# Patient Record
Sex: Female | Born: 1945 | Race: White | Hispanic: No | Marital: Married | Smoking: Never smoker
Health system: Southern US, Community
[De-identification: ages and names within clinical notes are randomized; demographics above are authoritative.]

## PROBLEM LIST (undated history)

## (undated) DIAGNOSIS — K219 Gastro-esophageal reflux disease without esophagitis: Secondary | ICD-10-CM

## (undated) DIAGNOSIS — J45909 Unspecified asthma, uncomplicated: Secondary | ICD-10-CM

## (undated) DIAGNOSIS — E785 Hyperlipidemia, unspecified: Secondary | ICD-10-CM

## (undated) DIAGNOSIS — F329 Major depressive disorder, single episode, unspecified: Secondary | ICD-10-CM

## (undated) DIAGNOSIS — R0600 Dyspnea, unspecified: Secondary | ICD-10-CM

## (undated) DIAGNOSIS — I38 Endocarditis, valve unspecified: Secondary | ICD-10-CM

## (undated) DIAGNOSIS — C801 Malignant (primary) neoplasm, unspecified: Secondary | ICD-10-CM

## (undated) DIAGNOSIS — M1990 Unspecified osteoarthritis, unspecified site: Secondary | ICD-10-CM

## (undated) DIAGNOSIS — F32A Depression, unspecified: Secondary | ICD-10-CM

## (undated) DIAGNOSIS — D649 Anemia, unspecified: Secondary | ICD-10-CM

## (undated) DIAGNOSIS — R51 Headache: Secondary | ICD-10-CM

## (undated) DIAGNOSIS — R519 Headache, unspecified: Secondary | ICD-10-CM

## (undated) DIAGNOSIS — M549 Dorsalgia, unspecified: Secondary | ICD-10-CM

## (undated) DIAGNOSIS — I10 Essential (primary) hypertension: Secondary | ICD-10-CM

## (undated) DIAGNOSIS — R011 Cardiac murmur, unspecified: Secondary | ICD-10-CM

## (undated) DIAGNOSIS — I2699 Other pulmonary embolism without acute cor pulmonale: Secondary | ICD-10-CM

## (undated) DIAGNOSIS — Z8719 Personal history of other diseases of the digestive system: Secondary | ICD-10-CM

## (undated) DIAGNOSIS — J189 Pneumonia, unspecified organism: Secondary | ICD-10-CM

## (undated) DIAGNOSIS — I350 Nonrheumatic aortic (valve) stenosis: Secondary | ICD-10-CM

## (undated) DIAGNOSIS — Z86718 Personal history of other venous thrombosis and embolism: Secondary | ICD-10-CM

## (undated) HISTORY — PX: BLADDER REPAIR: SHX6721

## (undated) HISTORY — PX: COLONOSCOPY: SHX174

## (undated) HISTORY — PX: HERNIA REPAIR: SHX51

## (undated) HISTORY — PX: BREAST LUMPECTOMY: SHX2

## (undated) HISTORY — PX: BACK SURGERY: SHX140

## (undated) HISTORY — PX: NASAL SINUS SURGERY: SHX719

## (undated) HISTORY — PX: OTHER SURGICAL HISTORY: SHX169

## (undated) HISTORY — PX: BUNIONECTOMY: SHX129

## (undated) HISTORY — PX: TONSILLECTOMY: SUR1361

---

## 2014-11-20 HISTORY — PX: ANTERIOR CERVICAL DECOMP/DISCECTOMY FUSION: SHX1161

## 2015-10-07 ENCOUNTER — Other Ambulatory Visit: Payer: Self-pay | Admitting: Neurosurgery

## 2015-10-23 NOTE — H&P (Signed)
Patient ID:   [id number] Patient: Bonnie Weeks  Date of Birth: [birth year] Visit Type: Office Visit   Date: 10/06/2015 02:30 PM Provider: Marchia Meiers. Vertell Limber MD   This 69 year old female presents for back pain and Leg pain.  History of Present Illness: 1.  back pain  2.  Leg pain  Bonnie Weeks, 69 year old retired female in [employer], visits reporting lumbar and left leg pain numbness and tingling.  She states she was referred to [facility] for same symptoms last year; but the physician recommended a cervical surgery instead.  She underwent the cervical surgery as recommended but has experienced continued even increased symptoms since that time.  She did not wish to return to that physician for evaluation of her lumbar symptoms.  Flexeril 10 mg daily at bedtime Norco 5/325 one only rarely gabapentin 300 mg twice a day Percogesic OTC daily at bedtime  Physical therapy, water therapy, massage therapy, dry needling, ESI's have offered little lasting relief.  History: HTN, breast cancer, heart valve Surgical history: Hiatal hernia repair 3, right breast lumpectomy 2008, cervical surgery in January 2016 [facility])  MRI and x-rays on [place]  The patient's imaging was reviewed.  This demonstrates on plain radiographs that she has dextral convex scoliosis lumbar spine.  In addition she has spondylolisthesis of L4 and L5 of 11 mm on extension, 11 mm on neutral lateral radiograph, and 12 mm on flexion.  She has considerable spinal stenosis at the L4 L5 level but also has coronal listhesis and malalignment at L3 L4.  Her scoliosis goes from L2 through L5 levels.  The patient describes pain all the time.  She describes that her outer left leg and buttock caused her considerable amount of pain and she feels that her left foot and the bottom of her foot are ""raw"       PAST MEDICAL HISTORY, SURGICAL HISTORY, FAMILY HISTORY, SOCIAL HISTORY AND REVIEW OF SYSTEMS I have reviewed the patient's past  medical, surgical, family and social history as well as the comprehensive review of systems as included on the [facility] history form dated 10/06/2015, which I have signed.   MEDICATIONS(added, continued or stopped this visit):   ALLERGIES: Ingredient Reaction Medication Name Comment  NO KNOWN ALLERGIES     No known allergies.    Vitals Date Temp F BP Pulse Ht In Wt Lb BMI BSA Pain Score  10/06/2015  114/71 91 64 205 35.19  6/10     PHYSICAL EXAM General Level of Distress: no acute distress Overall Appearance: normal  Head and Face  Right Left  Fundoscopic Exam:  normal normal    Cardiovascular Cardiac: regular rate and rhythm without murmur  Right Left  Carotid Pulses: normal normal  Respiratory Lungs: clear to auscultation  Neurological Orientation: normal Recent and Remote Memory: normal Attention Span and Concentration:   normal Language: normal Fund of Knowledge: normal  Right Left Sensation: normal normal Upper Extremity Coordination: normal normal  Lower Extremity Coordination: normal normal  Musculoskeletal Gait and Station: normal  Right Left Upper Extremity Muscle Strength: normal normal Lower Extremity Muscle Strength: normal normal Upper Extremity Muscle Tone:  normal normal Lower Extremity Muscle Tone: normal normal  Motor Strength Upper and lower extremity motor strength was tested in the clinically pertinent muscles. Any abnormal findings will be noted below.   Right Left EHL:  4/5   Deep Tendon Reflexes  Right Left Biceps: normal normal Triceps: normal normal Brachiloradialis: normal normal Patellar: normal normal  Achilles: normal normal  Sensory Sensation was tested at L1 to S1. Any abnormal findings will be noted below.  Right Left S1:  decreased  Cranial Nerves II. Optic Nerve/Visual Fields: normal III. Oculomotor: normal IV. Trochlear: normal V. Trigeminal: normal VI.  Abducens: normal VII. Facial: normal VIII. Acoustic/Vestibular: normal IX. Glossopharyngeal: normal X. Vagus: normal XI. Spinal Accessory: normal XII. Hypoglossal: normal  Motor and other Tests Lhermittes: negative Rhomberg: negative Pronator drift: absent     Right Left Hoffman's: normal normal Clonus: normal normal Babinski: normal normal SLR: negative positive Patrick's Bonnie Weeks): negative negative Toe Walk: normal normal Toe Lift: normal normal Heel Walk: normal normal SI Joint: nontender nontender   Additional Findings:  Patient has left hip abductor weakness at 4 out of 5.  She has left sciatic notch and lumbosacral junction pain to palpation.  She complains of numbness in both of her hands.    IMPRESSION The patient has spinal stenosis and spondylolisthesis at the L4 L5 level and in addition has weakness in her left leg in an L5 distribution.  His scoliosis and degenerative changes at the L2 through L5 levels as well.  She has not had an MRI for approximately a year and a half.  I explained to her that she would require repeat imaging as if there is been significant change this is likely going to effect to this specific recommendations that I have for her.  I do think she is going to require surgery for spondylolisthesis and stenosis as well as scoliosis.  Completed Orders (this encounter) Order Details Reason Side Interpretation Result Initial Treatment Date Region  Lumbar Spine- AP/Lat/Obls/Spot/Flex/Ex      10/06/2015 All Levels to All Levels   Assessment/Plan # Detail Type Description   1. Assessment Spondylolisthesis, lumbar region (M43.16).       2. Assessment Scoliosis (and kyphoscoliosis), idiopathic (M41.20).       3. Assessment Low back pain, unspecified back pain laterality, with sciatica presence unspecified (M54.5).       4. Assessment Lumbar radiculopathy (M54.16).       5. Assessment Lumbar stenosis (M48.06).         Pain  Assessment/Treatment Location: back/left leg. Onset: 10/06/1995. Duration: varies. Quality: discomforting. Pain Assessment/Treatment follow-up plan of care: Patient is taking medications as prescribed..  Patient will return after repeat lumbar imaging to make further recommendations.  I do think she will require surgery but the specific surgery will depend on change in her imaging.  We have tentatively booked decompression and fusion at the L4 L5 level on 11/09/15 but this in fact may be changed to a more involved procedure of XLIF at L2-3, L3 L4, L4 L5 levels with percutaneous screw fixation depending on evolution of her scoliosis and degenerative changes at the other levels of her lumbar spine.  Orders: Diagnostic Procedures: Assessment Procedure  M48.06 Lumbar Fusion  M48.06 MRI Spine/lumb W/o Contrast  M48.06 Return to Clinic after study is performed  M54.16 Lumbar Spine- AP/Lat/Obls/Spot/Flex/Ex             Provider:  Marchia Meiers. Vertell Limber MD  10/09/2015 03:22 PM Dictation edited by: Marchia Meiers. Vertell Limber    CC Providers: Erline Levine MD [address] [place], [place] [address]              Electronically signed by Marchia Meiers. Vertell Limber MD on 10/09/2015 03:22 PM

## 2015-10-27 ENCOUNTER — Encounter (HOSPITAL_COMMUNITY)
Admission: RE | Admit: 2015-10-27 | Discharge: 2015-10-27 | Disposition: A | Discharge status: Home / Self Care | Payer: Medicare (Managed Care) | Source: Ambulatory Visit | Attending: Neurosurgery | Admitting: Neurosurgery

## 2015-10-27 ENCOUNTER — Encounter (HOSPITAL_COMMUNITY): Payer: Self-pay

## 2015-10-27 DIAGNOSIS — Z01818 Encounter for other preprocedural examination: Secondary | ICD-10-CM | POA: Insufficient documentation

## 2015-10-27 DIAGNOSIS — Z01812 Encounter for preprocedural laboratory examination: Secondary | ICD-10-CM | POA: Insufficient documentation

## 2015-10-27 DIAGNOSIS — E785 Hyperlipidemia, unspecified: Secondary | ICD-10-CM | POA: Diagnosis not present

## 2015-10-27 DIAGNOSIS — I10 Essential (primary) hypertension: Secondary | ICD-10-CM | POA: Insufficient documentation

## 2015-10-27 DIAGNOSIS — Z79899 Other long term (current) drug therapy: Secondary | ICD-10-CM | POA: Insufficient documentation

## 2015-10-27 DIAGNOSIS — M4806 Spinal stenosis, lumbar region: Secondary | ICD-10-CM | POA: Insufficient documentation

## 2015-10-27 DIAGNOSIS — C50919 Malignant neoplasm of unspecified site of unspecified female breast: Secondary | ICD-10-CM | POA: Insufficient documentation

## 2015-10-27 DIAGNOSIS — J45909 Unspecified asthma, uncomplicated: Secondary | ICD-10-CM | POA: Diagnosis not present

## 2015-10-27 DIAGNOSIS — Z0183 Encounter for blood typing: Secondary | ICD-10-CM | POA: Insufficient documentation

## 2015-10-27 HISTORY — DX: Headache: R51

## 2015-10-27 HISTORY — DX: Major depressive disorder, single episode, unspecified: F32.9

## 2015-10-27 HISTORY — DX: Personal history of other diseases of the digestive system: Z87.19

## 2015-10-27 HISTORY — DX: Malignant (primary) neoplasm, unspecified: C80.1

## 2015-10-27 HISTORY — DX: Essential (primary) hypertension: I10

## 2015-10-27 HISTORY — DX: Headache, unspecified: R51.9

## 2015-10-27 HISTORY — DX: Hyperlipidemia, unspecified: E78.5

## 2015-10-27 HISTORY — DX: Unspecified asthma, uncomplicated: J45.909

## 2015-10-27 HISTORY — DX: Dorsalgia, unspecified: M54.9

## 2015-10-27 HISTORY — DX: Endocarditis, valve unspecified: I38

## 2015-10-27 HISTORY — DX: Depression, unspecified: F32.A

## 2015-10-27 LAB — BASIC METABOLIC PANEL
ANION GAP: 10 (ref 5–15)
BUN: 11 mg/dL (ref 6–20)
CALCIUM: 9.7 mg/dL (ref 8.9–10.3)
CO2: 29 mmol/L (ref 22–32)
Chloride: 101 mmol/L (ref 101–111)
Creatinine, Ser: 0.68 mg/dL (ref 0.44–1.00)
GFR calc Af Amer: >60 mL/min (ref >60)
GLUCOSE: 107 mg/dL — AB (ref 65–99)
Potassium: 3.6 mmol/L (ref 3.5–5.1)
Sodium: 140 mmol/L (ref 135–145)

## 2015-10-27 LAB — CBC
HCT: 37.2 % (ref 36.0–46.0)
Hemoglobin: 12 g/dL (ref 12.0–15.0)
MCH: 28.4 pg (ref 26.0–34.0)
MCHC: 32.3 g/dL (ref 30.0–36.0)
MCV: 87.9 fL (ref 78.0–100.0)
PLATELETS: 306 10*3/uL (ref 150–400)
RBC: 4.23 MIL/uL (ref 3.87–5.11)
RDW: 14.2 % (ref 11.5–15.5)
WBC: 7 10*3/uL (ref 4.0–10.5)

## 2015-10-27 LAB — TYPE AND SCREEN
ABO/RH(D): A POS
ANTIBODY SCREEN: NEGATIVE

## 2015-10-27 LAB — SURGICAL PCR SCREEN
MRSA, PCR: POSITIVE — AB
Staphylococcus aureus: POSITIVE — AB

## 2015-10-27 LAB — ABO/RH: ABO/RH(D): A POS

## 2015-10-27 NOTE — Progress Notes (Signed)
PCP and cardiologist is Dr. Lydia Guiles. Request sent for last office visit and any heart testing. Pt states she had an echo maybe in 2014-request sent. States she had a card cath 15 years or so ago and a stress test 10 or more years ago.

## 2015-10-27 NOTE — Pre-Procedure Instructions (Signed)
Bonnie Weeks  10/27/2015      [place], [place] - [address] [address] [address] [place] [place] [address] Phone: [phone] Fax: [phone]    Your procedure is scheduled on Dec 20  Report to [facility] at 700 A.M.  Call this number if you have problems the morning of surgery:  [phone]   Remember:  Do not eat food or drink liquids after midnight.  Take these medicines the morning of surgery with A SIP OF WATER anastrozole (Arimidex), cetirizine(Zyrtec), gabapentin (Neurpntin), cyclobenzaprine (Flexeril) if needed,  Hydrocodone-acetaminophen (Norco/ Vicodin) if needed, sertraline (Zoloft)  Stop taking Asprin, Ibuprofen, Motrin, Advil, Aleve, BC's, Goody's, Herbal medications, Fish Oil   Do not wear jewelry, make-up or nail polish.  Do not wear lotions, powders, or perfumes.  You may wear deodorant.  Do not shave 48 hours prior to surgery.  Men may shave face and neck.  Do not bring valuables to the hospital.  [facility] is not responsible for any belongings or valuables.  Contacts, dentures or bridgework may not be worn into surgery.  Leave your suitcase in the car.  After surgery it may be brought to your room.  For patients admitted to the hospital, discharge time will be determined by your treatment team.  Patients discharged the day of surgery will not be allowed to drive home.    Special instructions:  [facility] - Preparing for Surgery  Before surgery, you can play an important role.  Because skin is not sterile, your skin needs to be as free of germs as possible.  You can reduce the number of germs on you skin by washing with CHG (chlorahexidine gluconate) soap before surgery.  CHG is an antiseptic cleaner which kills germs and bonds with the skin to continue killing germs even after washing.  Please DO NOT use if you have an allergy to CHG or antibacterial soaps.  If your skin becomes  reddened/irritated stop using the CHG and inform your nurse when you arrive at Short Stay.  Do not shave (including legs and underarms) for at least 48 hours prior to the first CHG shower.  You may shave your face.  Please follow these instructions carefully:   1.  Shower with CHG Soap the night before surgery and the   morning of Surgery.  2.  If you choose to wash your hair, wash your hair first as usual with your   normal shampoo.  3.  After you shampoo, rinse your hair and body thoroughly to remove the  Shampoo.  4.  Use CHG as you would any other liquid soap.  You can apply chg directly   to the skin and wash gently with scrungie or a clean washcloth.  5.  Apply the CHG Soap to your body ONLY FROM THE NECK DOWN.    Do not use on open wounds or open sores.  Avoid contact with your eyes,  ears, mouth and genitals (private parts).  Wash genitals (private parts)  with your normal soap.  6.  Wash thoroughly, paying special attention to the area where your surgery  will be performed.  7.  Thoroughly rinse your body with warm water from the neck down.  8.  DO NOT shower/wash with your normal soap after using and rinsing off  the CHG Soap.  9.  Pat yourself dry with a clean towel.  10.  Wear clean pajamas.            11.  Place clean sheets on your bed the night of your first shower and do not sleep with pets.  Day of Surgery  Do not apply any lotions/deoderants the morning of surgery.  Please wear clean clothes to the hospital/surgery center.     Please read over the following fact sheets that you were given. Pain Booklet, Coughing and Deep Breathing, Blood Transfusion Information, MRSA Information and Surgical Site Infection Prevention

## 2015-10-27 NOTE — Progress Notes (Signed)
Not here for her 1200 appointment called and states she is almost here, thought her time was 1230.

## 2015-10-28 ENCOUNTER — Encounter (HOSPITAL_COMMUNITY): Payer: Self-pay

## 2015-10-28 NOTE — Progress Notes (Signed)
Anesthesia Chart Review:  Pt is 69 year old female scheduled for L4-5 maximum access PLIF on 11/09/2015 with Dr. Vertell Limber.   Cardiologist is Dr. Lydia Guiles in [place], [place], last office visit 09/10/15.   PMH includes:  HTN, hyperlipidemia, "leaky" heart valve, asthma, breast cancer. Never smoker.  Medications include: arimidex, hctz, prevacid, losartan-hctz, simvastatin.   Preoperative labs reviewed.    EKG 10/27/15: NSR. T wave abnormality, consider anterior ischemia. Prolonged QT (QTc 481).   Echo 06/16/13:  1. Normal LV function. EF 60% 2. Mild mitral regurgitation, mild MVP 3. Aortic sclerosis  Reviewed case with Dr. Conrad Plain. Pt without reported CV symptoms at PAT. EKG stable when compared to tracing dated 09/10/15 from cardiologist's office.   If no changes, I anticipate pt can proceed with surgery as scheduled.   Willeen Cass, FNP-BC [facility]/Anesthesiology Phone: [phone] 10/28/2015 3:08 PM

## 2015-11-08 ENCOUNTER — Encounter (HOSPITAL_COMMUNITY): Payer: Self-pay | Admitting: Anesthesiology

## 2015-11-08 MED ORDER — CEFAZOLIN SODIUM-DEXTROSE 2-3 GM-% IV SOLR
2.0000 g | INTRAVENOUS | Status: AC
  Administered 2015-11-09 (×2): 2 g via INTRAVENOUS
  Filled 2015-11-08: qty 50

## 2015-11-09 ENCOUNTER — Inpatient Hospital Stay (HOSPITAL_COMMUNITY): Payer: Medicare Other | Admitting: Anesthesiology

## 2015-11-09 ENCOUNTER — Encounter (HOSPITAL_COMMUNITY): Payer: Self-pay | Admitting: General Practice

## 2015-11-09 ENCOUNTER — Inpatient Hospital Stay (HOSPITAL_COMMUNITY): Payer: Medicare Other | Admitting: Emergency Medicine

## 2015-11-09 ENCOUNTER — Encounter (HOSPITAL_COMMUNITY)
Admission: RE | Disposition: A | Discharge status: Home Health | Payer: Self-pay | Source: Ambulatory Visit | Attending: Neurosurgery

## 2015-11-09 ENCOUNTER — Inpatient Hospital Stay (HOSPITAL_COMMUNITY): Payer: Medicare Other

## 2015-11-09 ENCOUNTER — Inpatient Hospital Stay (HOSPITAL_COMMUNITY)
Admission: RE | Admit: 2015-11-09 | Discharge: 2015-11-12 | DRG: 455 | Disposition: A | Discharge status: Home Health | Payer: Medicare Other | Source: Ambulatory Visit | Attending: Neurosurgery | Admitting: Neurosurgery

## 2015-11-09 DIAGNOSIS — M5116 Intervertebral disc disorders with radiculopathy, lumbar region: Secondary | ICD-10-CM | POA: Diagnosis present

## 2015-11-09 DIAGNOSIS — M545 Low back pain: Secondary | ICD-10-CM | POA: Diagnosis present

## 2015-11-09 DIAGNOSIS — M4806 Spinal stenosis, lumbar region: Secondary | ICD-10-CM | POA: Diagnosis present

## 2015-11-09 DIAGNOSIS — E7800 Pure hypercholesterolemia, unspecified: Secondary | ICD-10-CM | POA: Diagnosis present

## 2015-11-09 DIAGNOSIS — M419 Scoliosis, unspecified: Secondary | ICD-10-CM | POA: Diagnosis present

## 2015-11-09 DIAGNOSIS — M5117 Intervertebral disc disorders with radiculopathy, lumbosacral region: Secondary | ICD-10-CM | POA: Diagnosis present

## 2015-11-09 DIAGNOSIS — C50919 Malignant neoplasm of unspecified site of unspecified female breast: Secondary | ICD-10-CM | POA: Diagnosis present

## 2015-11-09 DIAGNOSIS — M4316 Spondylolisthesis, lumbar region: Secondary | ICD-10-CM | POA: Diagnosis present

## 2015-11-09 DIAGNOSIS — M4126 Other idiopathic scoliosis, lumbar region: Secondary | ICD-10-CM | POA: Diagnosis present

## 2015-11-09 DIAGNOSIS — Z79811 Long term (current) use of aromatase inhibitors: Secondary | ICD-10-CM | POA: Diagnosis not present

## 2015-11-09 DIAGNOSIS — I10 Essential (primary) hypertension: Secondary | ICD-10-CM | POA: Diagnosis present

## 2015-11-09 DIAGNOSIS — Z419 Encounter for procedure for purposes other than remedying health state, unspecified: Secondary | ICD-10-CM

## 2015-11-09 HISTORY — PX: POSTERIOR LUMBAR FUSION: SHX6036

## 2015-11-09 HISTORY — PX: ANTERIOR LAT LUMBAR FUSION: SHX1168

## 2015-11-09 IMAGING — RF DG C-ARM GT 120 MIN
1 series · 3 of 3 positions shown · non-contrast
Comparison: None.

CLINICAL DATA: Left-sided approach L2-5 packs XLIF, L5-S1 PLIF with
L2-S1 pedicle screws for stenosis.

EXAM:
LUMBAR SPINE - 2-3 VIEW; DG C-ARM GT 120 MIN

[Series 1: run · 3 of 3 slices shown]
[im 1/3]
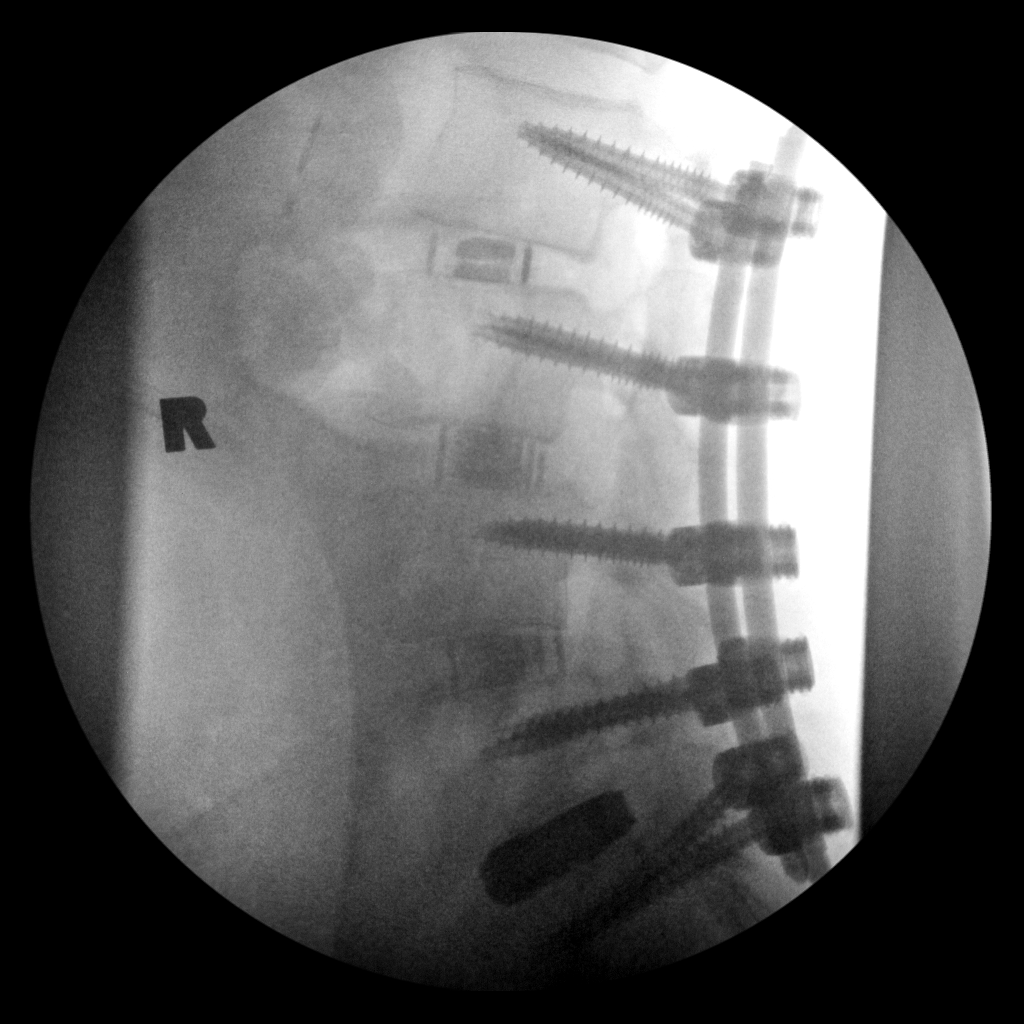
[im 2/3]
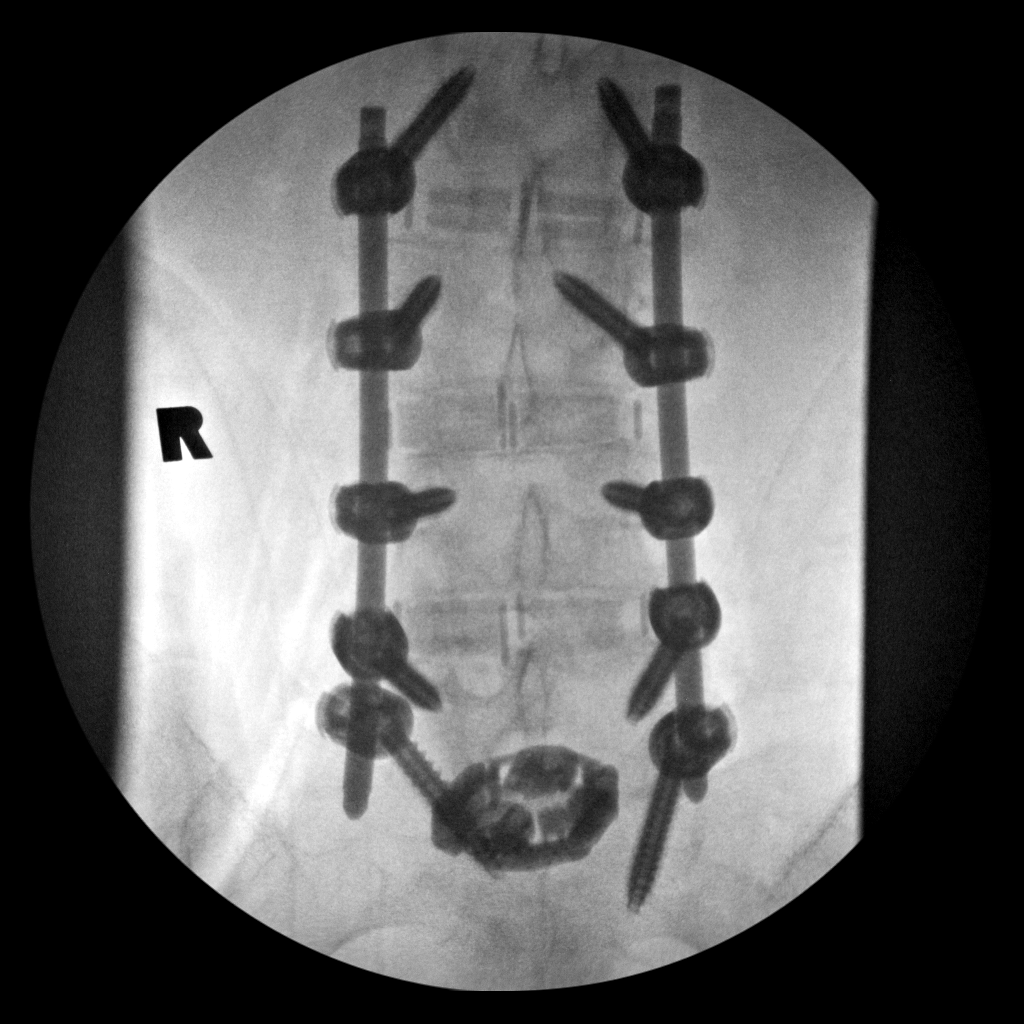
[im 3/3]
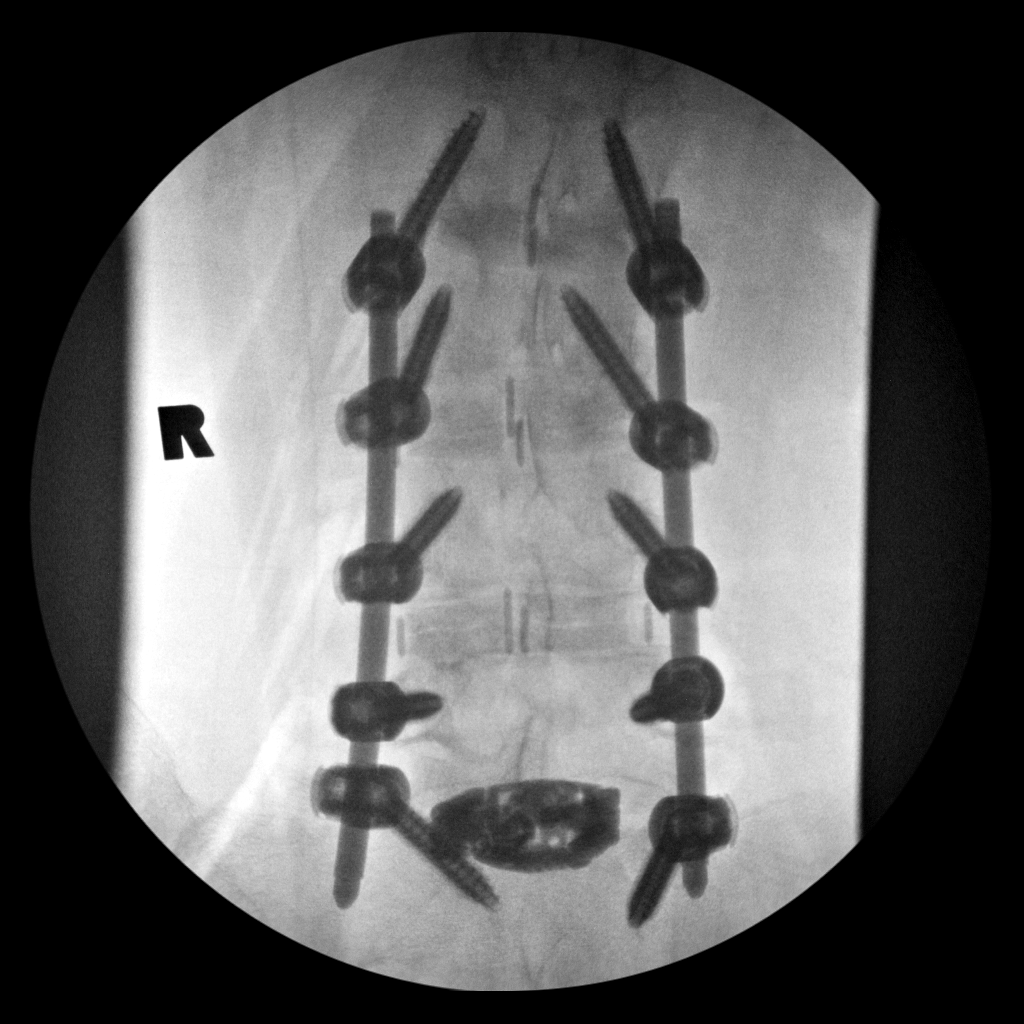

[3 of 3 positions shown; findings below may reference images not displayed]

FINDINGS: Vertebral body alignment and heights are within normal. There is
mild spondylosis present. There is evidence of patient's posterior
spinal fusion including bilateral pedicle screws from L2-S1 as
hardware is intact. Intervertebral cages over the intervening disc
spaces. Recommend correlation with findings at the time of the
procedure.
IMPRESSION: Postsurgical change compatible with posterior fusion from L2-S1 with
hardware intact.

## 2015-11-09 SURGERY — ANTERIOR LATERAL LUMBAR FUSION 3 LEVELS
Anesthesia: General | Site: Back

## 2015-11-09 MED ORDER — MIDAZOLAM HCL 2 MG/2ML IJ SOLN
INTRAMUSCULAR | Status: AC
  Filled 2015-11-09: qty 2

## 2015-11-09 MED ORDER — SODIUM CHLORIDE 0.9 % IJ SOLN
3.0000 mL | Freq: Two times a day (BID) | INTRAMUSCULAR | Status: DC
  Administered 2015-11-10 – 2015-11-11 (×3): 3 mL via INTRAVENOUS

## 2015-11-09 MED ORDER — ARTIFICIAL TEARS OP OINT
TOPICAL_OINTMENT | OPHTHALMIC | Status: AC
  Filled 2015-11-09: qty 3.5

## 2015-11-09 MED ORDER — FENTANYL CITRATE (PF) 100 MCG/2ML IJ SOLN
INTRAMUSCULAR | Status: AC
  Administered 2015-11-09: 50 ug via INTRAVENOUS
  Filled 2015-11-09: qty 2

## 2015-11-09 MED ORDER — FENTANYL CITRATE (PF) 100 MCG/2ML IJ SOLN
INTRAMUSCULAR | Status: AC
  Filled 2015-11-09: qty 2

## 2015-11-09 MED ORDER — METHOCARBAMOL 1000 MG/10ML IJ SOLN
500.0000 mg | Freq: Four times a day (QID) | INTRAMUSCULAR | Status: DC | PRN
  Filled 2015-11-09: qty 5

## 2015-11-09 MED ORDER — FLEET ENEMA 7-19 GM/118ML RE ENEM: 1.0000 | ENEMA | Freq: Once | RECTAL | Status: DC | PRN

## 2015-11-09 MED ORDER — ACETAMINOPHEN 650 MG RE SUPP: 650.0000 mg | RECTAL | Status: DC | PRN

## 2015-11-09 MED ORDER — EPHEDRINE SULFATE 50 MG/ML IJ SOLN
INTRAMUSCULAR | Status: AC
  Filled 2015-11-09: qty 1

## 2015-11-09 MED ORDER — FENTANYL CITRATE (PF) 100 MCG/2ML IJ SOLN
25.0000 ug | INTRAMUSCULAR | Status: DC | PRN
  Administered 2015-11-09 (×2): 50 ug via INTRAVENOUS

## 2015-11-09 MED ORDER — POLYETHYLENE GLYCOL 3350 17 G PO PACK: 17.0000 g | PACK | Freq: Every day | ORAL | Status: DC | PRN

## 2015-11-09 MED ORDER — ALUM & MAG HYDROXIDE-SIMETH 200-200-20 MG/5ML PO SUSP: 30.0000 mL | Freq: Four times a day (QID) | ORAL | Status: DC | PRN

## 2015-11-09 MED ORDER — SODIUM CHLORIDE 0.9 % IV SOLN: 250.0000 mL | INTRAVENOUS | Status: DC

## 2015-11-09 MED ORDER — LACTATED RINGERS IV SOLN
INTRAVENOUS | Status: DC | PRN
  Administered 2015-11-09 (×3): via INTRAVENOUS

## 2015-11-09 MED ORDER — PANTOPRAZOLE SODIUM 40 MG IV SOLR: 40.0000 mg | Freq: Every day | INTRAVENOUS | Status: DC

## 2015-11-09 MED ORDER — PANTOPRAZOLE SODIUM 20 MG PO TBEC
20.0000 mg | DELAYED_RELEASE_TABLET | Freq: Every day | ORAL | Status: DC
  Administered 2015-11-10 – 2015-11-12 (×3): 20 mg via ORAL
  Filled 2015-11-09 (×3): qty 1

## 2015-11-09 MED ORDER — ONDANSETRON HCL 4 MG/2ML IJ SOLN
INTRAMUSCULAR | Status: AC
  Filled 2015-11-09: qty 2

## 2015-11-09 MED ORDER — OXYCODONE-ACETAMINOPHEN 5-325 MG PO TABS
1.0000 | ORAL_TABLET | ORAL | Status: DC | PRN
  Administered 2015-11-09: 2 via ORAL
  Filled 2015-11-09: qty 2

## 2015-11-09 MED ORDER — PROPOFOL 10 MG/ML IV BOLUS
INTRAVENOUS | Status: AC
  Filled 2015-11-09: qty 20

## 2015-11-09 MED ORDER — CEFAZOLIN SODIUM-DEXTROSE 2-3 GM-% IV SOLR
2.0000 g | Freq: Three times a day (TID) | INTRAVENOUS | Status: AC
Start: 2015-11-09 — End: 2015-11-10
  Administered 2015-11-09 – 2015-11-10 (×2): 2 g via INTRAVENOUS
  Filled 2015-11-09 (×4): qty 50

## 2015-11-09 MED ORDER — DOCUSATE SODIUM 100 MG PO CAPS
100.0000 mg | ORAL_CAPSULE | Freq: Two times a day (BID) | ORAL | Status: DC
  Administered 2015-11-09 – 2015-11-12 (×6): 100 mg via ORAL
  Filled 2015-11-09 (×6): qty 1

## 2015-11-09 MED ORDER — GLYCOPYRROLATE 0.2 MG/ML IJ SOLN
INTRAMUSCULAR | Status: AC
  Filled 2015-11-09: qty 1

## 2015-11-09 MED ORDER — LOSARTAN POTASSIUM-HCTZ 50-12.5 MG PO TABS: 1.0000 | ORAL_TABLET | Freq: Every day | ORAL | Status: DC

## 2015-11-09 MED ORDER — PHENOL 1.4 % MT LIQD: 1.0000 | OROMUCOSAL | Status: DC | PRN

## 2015-11-09 MED ORDER — SODIUM CHLORIDE 0.9 % IJ SOLN: 3.0000 mL | INTRAMUSCULAR | Status: DC | PRN

## 2015-11-09 MED ORDER — 0.9 % SODIUM CHLORIDE (POUR BTL) OPTIME
TOPICAL | Status: DC | PRN
  Administered 2015-11-09: 1000 mL

## 2015-11-09 MED ORDER — ONDANSETRON HCL 4 MG/2ML IJ SOLN
4.0000 mg | INTRAMUSCULAR | Status: DC | PRN
  Administered 2015-11-09: 4 mg via INTRAVENOUS
  Filled 2015-11-09: qty 2

## 2015-11-09 MED ORDER — FENTANYL CITRATE (PF) 250 MCG/5ML IJ SOLN
INTRAMUSCULAR | Status: AC
  Filled 2015-11-09: qty 5

## 2015-11-09 MED ORDER — SIMVASTATIN 20 MG PO TABS
20.0000 mg | ORAL_TABLET | Freq: Every day | ORAL | Status: DC
  Administered 2015-11-10 – 2015-11-12 (×3): 20 mg via ORAL
  Filled 2015-11-09 (×3): qty 1

## 2015-11-09 MED ORDER — EPHEDRINE SULFATE 50 MG/ML IJ SOLN
INTRAMUSCULAR | Status: DC | PRN
  Administered 2015-11-09: 25 mg via INTRAVENOUS

## 2015-11-09 MED ORDER — SUCCINYLCHOLINE CHLORIDE 20 MG/ML IJ SOLN
INTRAMUSCULAR | Status: DC | PRN
  Administered 2015-11-09: 100 mg via INTRAVENOUS

## 2015-11-09 MED ORDER — MENTHOL 3 MG MT LOZG: 1.0000 | LOZENGE | OROMUCOSAL | Status: DC | PRN

## 2015-11-09 MED ORDER — PHENYLEPHRINE HCL 10 MG/ML IJ SOLN
10.0000 mg | INTRAVENOUS | Status: DC | PRN
  Administered 2015-11-09: 25 ug/min via INTRAVENOUS

## 2015-11-09 MED ORDER — HYDROCHLOROTHIAZIDE 25 MG PO TABS: 25.0000 mg | ORAL_TABLET | Freq: Every day | ORAL | Status: DC

## 2015-11-09 MED ORDER — ANASTROZOLE 1 MG PO TABS
1.0000 mg | ORAL_TABLET | Freq: Every day | ORAL | Status: DC
  Administered 2015-11-10 – 2015-11-12 (×3): 1 mg via ORAL
  Filled 2015-11-09 (×3): qty 1

## 2015-11-09 MED ORDER — CYCLOBENZAPRINE HCL 10 MG PO TABS
10.0000 mg | ORAL_TABLET | Freq: Three times a day (TID) | ORAL | Status: DC | PRN
  Administered 2015-11-09 – 2015-11-11 (×2): 10 mg via ORAL
  Filled 2015-11-09 (×2): qty 1

## 2015-11-09 MED ORDER — PROPOFOL 10 MG/ML IV BOLUS
INTRAVENOUS | Status: DC | PRN
  Administered 2015-11-09: 160 mg via INTRAVENOUS

## 2015-11-09 MED ORDER — HYDROMORPHONE HCL 1 MG/ML IJ SOLN
0.5000 mg | INTRAMUSCULAR | Status: DC | PRN
  Administered 2015-11-09 – 2015-11-11 (×7): 1 mg via INTRAVENOUS
  Filled 2015-11-09 (×8): qty 1

## 2015-11-09 MED ORDER — SUFENTANIL CITRATE 50 MCG/ML IV SOLN
INTRAVENOUS | Status: DC | PRN
  Administered 2015-11-09 (×4): 10 ug via INTRAVENOUS

## 2015-11-09 MED ORDER — LIDOCAINE HCL (CARDIAC) 20 MG/ML IV SOLN
INTRAVENOUS | Status: AC
  Filled 2015-11-09: qty 5

## 2015-11-09 MED ORDER — THROMBIN 20000 UNITS EX SOLR
CUTANEOUS | Status: DC | PRN
  Administered 2015-11-09: 10:00:00 via TOPICAL

## 2015-11-09 MED ORDER — PROPOFOL 500 MG/50ML IV EMUL
INTRAVENOUS | Status: DC | PRN
  Administered 2015-11-09: 50 ug/kg/min via INTRAVENOUS

## 2015-11-09 MED ORDER — KCL IN DEXTROSE-NACL 20-5-0.45 MEQ/L-%-% IV SOLN
INTRAVENOUS | Status: DC
  Administered 2015-11-09: 22:00:00 via INTRAVENOUS
  Filled 2015-11-09 (×7): qty 1000

## 2015-11-09 MED ORDER — FENTANYL CITRATE (PF) 100 MCG/2ML IJ SOLN
INTRAMUSCULAR | Status: DC | PRN
  Administered 2015-11-09: 50 ug via INTRAVENOUS
  Administered 2015-11-09 (×2): 100 ug via INTRAVENOUS

## 2015-11-09 MED ORDER — ALBUMIN HUMAN 5 % IV SOLN
INTRAVENOUS | Status: DC | PRN
  Administered 2015-11-09: 10:00:00 via INTRAVENOUS

## 2015-11-09 MED ORDER — LACTATED RINGERS IV SOLN
INTRAVENOUS | Status: DC
  Administered 2015-11-09: 08:00:00 via INTRAVENOUS

## 2015-11-09 MED ORDER — MONTELUKAST SODIUM 10 MG PO TABS
10.0000 mg | ORAL_TABLET | Freq: Every day | ORAL | Status: DC
  Administered 2015-11-09 – 2015-11-11 (×3): 10 mg via ORAL
  Filled 2015-11-09 (×3): qty 1

## 2015-11-09 MED ORDER — VANCOMYCIN HCL IN DEXTROSE 1-5 GM/200ML-% IV SOLN
1000.0000 mg | Freq: Once | INTRAVENOUS | Status: AC
  Administered 2015-11-09: 1000 mg via INTRAVENOUS
  Filled 2015-11-09: qty 200

## 2015-11-09 MED ORDER — HYDROCHLOROTHIAZIDE 12.5 MG PO CAPS
12.5000 mg | ORAL_CAPSULE | Freq: Every day | ORAL | Status: DC
  Administered 2015-11-10 – 2015-11-12 (×3): 12.5 mg via ORAL
  Filled 2015-11-09 (×3): qty 1

## 2015-11-09 MED ORDER — SUCCINYLCHOLINE CHLORIDE 20 MG/ML IJ SOLN
INTRAMUSCULAR | Status: AC
  Filled 2015-11-09: qty 1

## 2015-11-09 MED ORDER — BUPIVACAINE HCL (PF) 0.5 % IJ SOLN
INTRAMUSCULAR | Status: DC | PRN
Start: 2015-11-09 — End: 2015-11-09
  Administered 2015-11-09: 25 mL
  Administered 2015-11-09: 10 mL

## 2015-11-09 MED ORDER — SUFENTANIL CITRATE 50 MCG/ML IV SOLN
INTRAVENOUS | Status: AC
  Filled 2015-11-09: qty 1

## 2015-11-09 MED ORDER — ACETAMINOPHEN 325 MG PO TABS: 650.0000 mg | ORAL_TABLET | ORAL | Status: DC | PRN

## 2015-11-09 MED ORDER — BISACODYL 10 MG RE SUPP: 10.0000 mg | Freq: Every day | RECTAL | Status: DC | PRN

## 2015-11-09 MED ORDER — ONDANSETRON HCL 4 MG/2ML IJ SOLN
INTRAMUSCULAR | Status: DC | PRN
  Administered 2015-11-09: 4 mg via INTRAVENOUS

## 2015-11-09 MED ORDER — METHOCARBAMOL 500 MG PO TABS: 500.0000 mg | ORAL_TABLET | Freq: Four times a day (QID) | ORAL | Status: DC | PRN

## 2015-11-09 MED ORDER — ZOLPIDEM TARTRATE 5 MG PO TABS
5.0000 mg | ORAL_TABLET | Freq: Every evening | ORAL | Status: DC | PRN
  Administered 2015-11-09: 5 mg via ORAL
  Filled 2015-11-09: qty 1

## 2015-11-09 MED ORDER — ONDANSETRON HCL 4 MG/2ML IJ SOLN
4.0000 mg | Freq: Once | INTRAMUSCULAR | Status: AC | PRN
  Administered 2015-11-09: 4 mg via INTRAVENOUS

## 2015-11-09 MED ORDER — GABAPENTIN 300 MG PO CAPS
300.0000 mg | ORAL_CAPSULE | Freq: Three times a day (TID) | ORAL | Status: DC
  Administered 2015-11-09 – 2015-11-12 (×8): 300 mg via ORAL
  Filled 2015-11-09 (×8): qty 1

## 2015-11-09 MED ORDER — LIDOCAINE-EPINEPHRINE 1 %-1:100000 IJ SOLN
INTRAMUSCULAR | Status: DC | PRN
  Administered 2015-11-09: 10 mL
  Administered 2015-11-09: 25 mL

## 2015-11-09 MED ORDER — VANCOMYCIN HCL 1000 MG IV SOLR
1000.0000 mg | INTRAVENOUS | Status: DC | PRN
  Administered 2015-11-09: 1000 mg via INTRAVENOUS

## 2015-11-09 MED ORDER — LOSARTAN POTASSIUM 50 MG PO TABS
50.0000 mg | ORAL_TABLET | Freq: Every day | ORAL | Status: DC
  Administered 2015-11-10 – 2015-11-12 (×3): 50 mg via ORAL
  Filled 2015-11-09 (×4): qty 1

## 2015-11-09 MED ORDER — LORATADINE 10 MG PO TABS
10.0000 mg | ORAL_TABLET | Freq: Every day | ORAL | Status: DC
  Administered 2015-11-10 – 2015-11-12 (×3): 10 mg via ORAL
  Filled 2015-11-09 (×3): qty 1

## 2015-11-09 MED ORDER — DIAZEPAM 5 MG PO TABS
5.0000 mg | ORAL_TABLET | Freq: Four times a day (QID) | ORAL | Status: DC | PRN
  Administered 2015-11-10 – 2015-11-11 (×2): 5 mg via ORAL
  Filled 2015-11-09 (×2): qty 1

## 2015-11-09 MED ORDER — PHENYLEPHRINE 40 MCG/ML (10ML) SYRINGE FOR IV PUSH (FOR BLOOD PRESSURE SUPPORT)
PREFILLED_SYRINGE | INTRAVENOUS | Status: AC
  Filled 2015-11-09: qty 10

## 2015-11-09 MED ORDER — SERTRALINE HCL 50 MG PO TABS
150.0000 mg | ORAL_TABLET | Freq: Every day | ORAL | Status: DC
  Administered 2015-11-10 – 2015-11-12 (×3): 150 mg via ORAL
  Filled 2015-11-09 (×3): qty 1

## 2015-11-09 MED ORDER — HYDROCODONE-ACETAMINOPHEN 5-325 MG PO TABS
1.0000 | ORAL_TABLET | Freq: Four times a day (QID) | ORAL | Status: DC | PRN
  Administered 2015-11-10 – 2015-11-11 (×3): 1 via ORAL
  Filled 2015-11-09 (×4): qty 1

## 2015-11-09 MED ORDER — LIDOCAINE HCL (CARDIAC) 20 MG/ML IV SOLN
INTRAVENOUS | Status: DC | PRN
  Administered 2015-11-09: 50 mg via INTRAVENOUS

## 2015-11-09 SURGICAL SUPPLY — 93 items
BENZOIN TINCTURE PRP APPL 2/3 (GAUZE/BANDAGES/DRESSINGS) ×4 IMPLANT
BLADE CLIPPER SURG (BLADE) IMPLANT
BONE CANC CHIPS 20CC PCAN1/4 (Bone Implant) ×4 IMPLANT
BUR MATCHSTICK NEURO 3.0 LAGG (BURR) ×4 IMPLANT
BUR PRECISION FLUTE 5.0 (BURR) ×4 IMPLANT
CAGE MLX 8X34X24-8 (Cage) ×3 IMPLANT
CAGE MLX 8X34X24MM-8 (Cage) ×1 IMPLANT
CANISTER SUCT 3000ML PPV (MISCELLANEOUS) ×4 IMPLANT
CHIPS CANC BONE 20CC PCAN1/4 (Bone Implant) ×2 IMPLANT
CLIP NEUROVISION LG (CLIP) ×4 IMPLANT
CLOSURE WOUND 1/2 X4 (GAUZE/BANDAGES/DRESSINGS) ×1
CONT SPEC 4OZ CLIKSEAL STRL BL (MISCELLANEOUS) ×4 IMPLANT
COROENT XL 8X22X45 (Orthopedic Implant) ×4 IMPLANT
COROENT XL-W 10X22X50 (Orthopedic Implant) ×8 IMPLANT
COVER BACK TABLE 24X17X13 BIG (DRAPES) IMPLANT
COVER BACK TABLE 60X90IN (DRAPES) ×4 IMPLANT
DECANTER SPIKE VIAL GLASS SM (MISCELLANEOUS) ×4 IMPLANT
DERMABOND ADVANCED (GAUZE/BANDAGES/DRESSINGS) ×8
DERMABOND ADVANCED .7 DNX12 (GAUZE/BANDAGES/DRESSINGS) ×8 IMPLANT
DRAPE C-ARM 42X72 X-RAY (DRAPES) ×12 IMPLANT
DRAPE C-ARMOR (DRAPES) ×8 IMPLANT
DRAPE LAPAROTOMY 100X72X124 (DRAPES) ×4 IMPLANT
DRAPE POUCH INSTRU U-SHP 10X18 (DRAPES) ×8 IMPLANT
DRAPE SURG 17X23 STRL (DRAPES) ×4 IMPLANT
DRSG OPSITE POSTOP 3X4 (GAUZE/BANDAGES/DRESSINGS) ×4 IMPLANT
DRSG OPSITE POSTOP 4X6 (GAUZE/BANDAGES/DRESSINGS) ×12 IMPLANT
DRSG OPSITE POSTOP 4X8 (GAUZE/BANDAGES/DRESSINGS) ×8 IMPLANT
DURAPREP 26ML APPLICATOR (WOUND CARE) ×8 IMPLANT
ELECT REM PT RETURN 9FT ADLT (ELECTROSURGICAL) ×8
ELECTRODE REM PT RTRN 9FT ADLT (ELECTROSURGICAL) ×4 IMPLANT
EVACUATOR 1/8 PVC DRAIN (DRAIN) ×4 IMPLANT
GAUZE SPONGE 4X4 12PLY STRL (GAUZE/BANDAGES/DRESSINGS) IMPLANT
GAUZE SPONGE 4X4 16PLY XRAY LF (GAUZE/BANDAGES/DRESSINGS) ×4 IMPLANT
GLOVE BIO SURGEON STRL SZ8 (GLOVE) ×8 IMPLANT
GLOVE BIOGEL PI IND STRL 8 (GLOVE) ×4 IMPLANT
GLOVE BIOGEL PI IND STRL 8.5 (GLOVE) ×4 IMPLANT
GLOVE BIOGEL PI INDICATOR 8 (GLOVE) ×4
GLOVE BIOGEL PI INDICATOR 8.5 (GLOVE) ×4
GLOVE ECLIPSE 8.0 STRL XLNG CF (GLOVE) ×8 IMPLANT
GLOVE EXAM NITRILE LRG STRL (GLOVE) IMPLANT
GLOVE EXAM NITRILE MD LF STRL (GLOVE) IMPLANT
GLOVE EXAM NITRILE XL STR (GLOVE) IMPLANT
GLOVE EXAM NITRILE XS STR PU (GLOVE) IMPLANT
GOWN STRL REUS W/ TWL LRG LVL3 (GOWN DISPOSABLE) ×6 IMPLANT
GOWN STRL REUS W/ TWL XL LVL3 (GOWN DISPOSABLE) ×8 IMPLANT
GOWN STRL REUS W/TWL 2XL LVL3 (GOWN DISPOSABLE) ×16 IMPLANT
GOWN STRL REUS W/TWL LRG LVL3 (GOWN DISPOSABLE) ×6
GOWN STRL REUS W/TWL XL LVL3 (GOWN DISPOSABLE) ×8
GUIDEWIRE NITINOL BEVEL TIP (WIRE) ×40 IMPLANT
KIT BASIN OR (CUSTOM PROCEDURE TRAY) ×8 IMPLANT
KIT DILATOR XLIF 5 (KITS) ×2 IMPLANT
KIT INFUSE MEDIUM (Orthopedic Implant) ×4 IMPLANT
KIT POSITION SURG JACKSON T1 (MISCELLANEOUS) ×4 IMPLANT
KIT ROOM TURNOVER OR (KITS) ×8 IMPLANT
KIT SURGICAL ACCESS MAXCESS 4 (KITS) ×4 IMPLANT
KIT XLIF (KITS) ×2
MILL MEDIUM DISP (BLADE) ×4 IMPLANT
MODULE NVM5 NEXT GEN EMG (NEEDLE) ×4 IMPLANT
NEEDLE HYPO 25X1 1.5 SAFETY (NEEDLE) ×8 IMPLANT
NEEDLE I PASS (NEEDLE) ×4 IMPLANT
NEEDLE SPNL 18GX3.5 QUINCKE PK (NEEDLE) IMPLANT
NS IRRIG 1000ML POUR BTL (IV SOLUTION) ×8 IMPLANT
PACK LAMINECTOMY NEURO (CUSTOM PROCEDURE TRAY) ×8 IMPLANT
PAD ARMBOARD 7.5X6 YLW CONV (MISCELLANEOUS) IMPLANT
PATTIES SURGICAL .5 X.5 (GAUZE/BANDAGES/DRESSINGS) IMPLANT
PATTIES SURGICAL .5 X1 (DISPOSABLE) IMPLANT
PATTIES SURGICAL 1X1 (DISPOSABLE) IMPLANT
PUTTY BONE ATTRAX 10CC STRIP (Putty) ×4 IMPLANT
PUTTY BONE DBX 2.5 MIS (Bone Implant) ×4 IMPLANT
ROD RELINE MAS LORD 5.5X120MM (Rod) ×8 IMPLANT
SCREW LOCK RELINE 5.5 TULIP (Screw) ×40 IMPLANT
SCREW MAS RELINE 6.5X45 POLY (Screw) ×4 IMPLANT
SCREW MAS RELINE POLY 6.5X40 (Screw) ×28 IMPLANT
SCREW SHANK RELINE 6.5X45MM 2C (Screw) ×8 IMPLANT
SPINE TULIP RELINE MOD (Neuro Prosthesis/Implant) ×8 IMPLANT
SPONGE LAP 4X18 X RAY DECT (DISPOSABLE) IMPLANT
SPONGE SURGIFOAM ABS GEL 100 (HEMOSTASIS) ×4 IMPLANT
STAPLER SKIN PROX WIDE 3.9 (STAPLE) ×4 IMPLANT
STRIP CLOSURE SKIN 1/2X4 (GAUZE/BANDAGES/DRESSINGS) ×3 IMPLANT
SUT VIC AB 1 CT1 18XBRD ANBCTR (SUTURE) ×6 IMPLANT
SUT VIC AB 1 CT1 8-18 (SUTURE) ×6
SUT VIC AB 2-0 CT1 18 (SUTURE) ×12 IMPLANT
SUT VIC AB 3-0 SH 8-18 (SUTURE) ×12 IMPLANT
SYR 3ML LL SCALE MARK (SYRINGE) IMPLANT
SYR 5ML LL (SYRINGE) IMPLANT
TAPE CLOTH 3X10 TAN LF (GAUZE/BANDAGES/DRESSINGS) ×12 IMPLANT
TOWEL OR 17X24 6PK STRL BLUE (TOWEL DISPOSABLE) ×8 IMPLANT
TOWEL OR 17X26 10 PK STRL BLUE (TOWEL DISPOSABLE) ×8 IMPLANT
TRAP SPECIMEN MUCOUS 40CC (MISCELLANEOUS) IMPLANT
TRAY FOLEY W/METER SILVER 14FR (SET/KITS/TRAYS/PACK) ×4 IMPLANT
TULIP SPINE RELINE MOD (Neuro Prosthesis/Implant) ×4 IMPLANT
WATER STERILE IRR 1000ML POUR (IV SOLUTION) ×4 IMPLANT
mlx graft containment plate 8mm ×4 IMPLANT

## 2015-11-09 NOTE — Anesthesia Procedure Notes (Signed)
Procedure Name: Intubation Date/Time: 11/09/2015 10:14 AM Performed by: Eligha Bridegroom Pre-anesthesia Checklist: Patient identified, Timeout performed, Emergency Drugs available, Suction available and Patient being monitored Patient Re-evaluated:Patient Re-evaluated prior to inductionOxygen Delivery Method: Circle system utilized Preoxygenation: Pre-oxygenation with 100% oxygen Intubation Type: IV induction Laryngoscope Size: Mac and 3 Grade View: Grade I Tube type: Oral Tube size: 7.0 mm Number of attempts: 1 Placement Confirmation: ETT inserted through vocal cords under direct vision,  breath sounds checked- equal and bilateral and positive ETCO2 Secured at: 21 cm Tube secured with: Tape Dental Injury: Teeth and Oropharynx as per pre-operative assessment

## 2015-11-09 NOTE — Anesthesia Preprocedure Evaluation (Addendum)
Anesthesia Evaluation  Patient identified by MRN, date of birth, ID band Patient awake    Reviewed: Allergy & Precautions, NPO status , Patient's Chart, lab work & pertinent test results  Airway Mallampati: II  TM Distance: >3 FB Neck ROM: Full    Dental  (+) Teeth Intact, Dental Advisory Given   Pulmonary    breath sounds clear to auscultation       Cardiovascular hypertension,  Rhythm:Regular Rate:Normal     Neuro/Psych    GI/Hepatic   Endo/Other    Renal/GU      Musculoskeletal   Abdominal   Peds  Hematology   Anesthesia Other Findings   Reproductive/Obstetrics                            Anesthesia Physical Anesthesia Plan  ASA: III  Anesthesia Plan: General   Post-op Pain Management:    Induction: Intravenous  Airway Management Planned: Oral ETT  Additional Equipment:   Intra-op Plan:   Post-operative Plan:   Informed Consent: I have reviewed the patients History and Physical, chart, labs and discussed the procedure including the risks, benefits and alternatives for the proposed anesthesia with the patient or authorized representative who has indicated his/her understanding and acceptance.   Dental advisory given  Plan Discussed with: CRNA and Anesthesiologist  Anesthesia Plan Comments: (Lumbar spondylosis S/P posterior cervical fusion Hypertension H/O R. Breast Ca  Plan GA with oral ETT  Roberts Gaudy)        Anesthesia Quick Evaluation

## 2015-11-09 NOTE — Brief Op Note (Signed)
11/09/2015  5:03 PM  PATIENT:  Bonnie Weeks  69 y.o. female  PRE-OPERATIVE DIAGNOSIS:  Lumbar scoliosis, stenosis, spondylolisthesis, herniated lumbar disc, spondylosis, radiculopathy L 23, L 34, L 45, L 5 S1 levels  POST-OPERATIVE DIAGNOSIS: Lumbar scoliosis, stenosis, spondylolisthesis, herniated lumbar disc, spondylosis, radiculopathy L 23, L 34, L 45, L 5 S1 levels   PROCEDURE:  Procedure(s): Lumbar two-three,lumbar three-four.lumbar four-five Anterior Lateral Lumbar Fusion (Left) POSTERIOR LUMBAR FUSION LUMBAR FIVE-SACRAL ONE with Pedicle Screws LUMBAR TWO TO LUMBAR SACRAL ONE (N/A)  SURGEON:  Surgeon(s) and Role:    * Erline Levine, MD - Primary    * Consuella Lose, MD - Assisting  PHYSICIAN ASSISTANT:   ASSISTANTS: [place], RN   ANESTHESIA:   general  EBL:  Total I/O In: [id number] [I.V.:2000; IV Piggyback:250] Out: 65 [Urine:490; Blood:100]  BLOOD ADMINISTERED:none  DRAINS: none   LOCAL MEDICATIONS USED:  MARCAINE    and LIDOCAINE   SPECIMEN:  No Specimen  DISPOSITION OF SPECIMEN:  N/A  COUNTS:  YES  TOURNIQUET:  * No tourniquets in log *  DICTATION: Patient is a 69 year old with severe spondylosis stenosis and scoliosis of the lumbar spine. It was elected to take her to surgery for anterolateral decompression and posterior pedicle screw fixation with MLX TLIF at L 5 S 1.  Procedure: Patient was brought to the operating room and placed in a right lateral decubitus position on the operative table and using orthogonally projected C-arm fluoroscopy the patient was placed so that the L2-3 L3-4 and L4-5 levels were visualized in AP and lateral plane. The patient was then taped into position. The table was flexed so as to expose the L4-5 level as the patient has a high iliac crest. Skin was marked along with a posterior finger dissection incision. His flank was then prepped and draped in usual sterile fashion and incisions were made sequentially at L4-5 L3-4 and L2-3  levels. Posterior finger dissection was made to enter the retroperitoneal space and then subsequently the probe was inserted into the psoas muscle from the left side initially at the L4-5 level. After mapping the neural elements were able to dock the probe per the midpoint of this vertebral level and without indications electrically of too close proximity to the neural tissues. Subsequently the self-retaining tractor was.after sequential dilators were utilized the shim was employed and the interspace was cleared of psoas muscle and then incised. A thorough discectomy was performed. Instruments were used to clear the interspace of disc material. After thorough discectomy was performed and this was performed using AP and lateral fluoroscopy a 10 lordotic by 50 x 22 mm implant was packed with BMP and Atrax synthetic bone graft material. This was tamped into position using the slides and its position was confirmed on AP and lateral fluoroscopy. Subsequently exposure was performed at the L3-4 level and similar dissection was performed with locking of the self-retaining retractor. At this level were able to place a 10 lordotic by 22 x 50 mm implant packed in a similar fashion. At the L2-3 level were able to place an 8 mm standard by 45 x 22 mm implant packed in a similar fashion. Hemostasis was assured the wounds were irrigated and closed with interrupted Vicryl sutures.  Patient was then turned into a prone position on the operating table on Jackson Table  using AP and lateral fluoroscopy throughout this portion of the procedure,  Prior to placing all screws, a MAS TLIF was performed with thorough decompression of the thecal  sac and L5 and S1 nerve roots with facetectomy at the L 5 S 1 level on the left.  This bone was morselized and used for autograft.  An 8 x 34 x 24  mm 8 degree lordosis Nuvasive MLX TLIF implant was packed with BMP and autograft and allograft after placing the autograft in the interspace. pedicle  screws were placed using Nuvasive cannulated percutaneous screws. 2 screws were placed at L 2, L 3 and (6.5 x 40 mm) and 2 at L4 of similar size and 2 6.5 x 40 at L5 and 2 at S 1. Remaining screws were placed and 120 mm rods were passed  And the screws were locked, towers torqued and disassembled.Final Xrays showed well positioned implants and screw fixation. The wounds were irrigated and then closed with 1, 2-0 and 3-0 Vicryl stitches. Sterile occlusive dressing was placed with Dermabond. The patient was then extubated in the operating room and taken to recovery in stable and satisfactory condition having tolerated his operation well. Counts were correct at the end of the case.  PLAN OF CARE: Admit to inpatient   PATIENT DISPOSITION:  PACU - hemodynamically stable.   Delay start of Pharmacological VTE agent (>24hrs) due to surgical blood loss or risk of bleeding: yes

## 2015-11-09 NOTE — Op Note (Signed)
11/09/2015  5:03 PM  PATIENT:  Bonnie Weeks  69 y.o. female  PRE-OPERATIVE DIAGNOSIS:  Lumbar scoliosis, stenosis, spondylolisthesis, herniated lumbar disc, spondylosis, radiculopathy L 23, L 34, L 45, L 5 S1 levels  POST-OPERATIVE DIAGNOSIS: Lumbar scoliosis, stenosis, spondylolisthesis, herniated lumbar disc, spondylosis, radiculopathy L 23, L 34, L 45, L 5 S1 levels   PROCEDURE:  Procedure(s): Lumbar two-three,lumbar three-four.lumbar four-five Anterior Lateral Lumbar Fusion (Left) POSTERIOR LUMBAR FUSION LUMBAR FIVE-SACRAL ONE with Pedicle Screws LUMBAR TWO TO LUMBAR SACRAL ONE (N/A)  SURGEON:  Surgeon(s) and Role:    * Erline Levine, MD - Primary    * Consuella Lose, MD - Assisting  PHYSICIAN ASSISTANT:   ASSISTANTS: Poteat, RN   ANESTHESIA:   general  EBL:  Total I/O In: [id number] [I.V.:2000; IV Piggyback:250] Out: 30 [Urine:490; Blood:100]  BLOOD ADMINISTERED:none  DRAINS: none   LOCAL MEDICATIONS USED:  MARCAINE    and LIDOCAINE   SPECIMEN:  No Specimen  DISPOSITION OF SPECIMEN:  N/A  COUNTS:  YES  TOURNIQUET:  * No tourniquets in log *  DICTATION: Patient is a 69 year old with severe spondylosis stenosis and scoliosis of the lumbar spine. It was elected to take her to surgery for anterolateral decompression and posterior pedicle screw fixation with MLX TLIF at L 5 S 1.  Procedure: Patient was brought to the operating room and placed in a right lateral decubitus position on the operative table and using orthogonally projected C-arm fluoroscopy the patient was placed so that the L2-3 L3-4 and L4-5 levels were visualized in AP and lateral plane. The patient was then taped into position. The table was flexed so as to expose the L4-5 level as the patient has a high iliac crest. Skin was marked along with a posterior finger dissection incision. His flank was then prepped and draped in usual sterile fashion and incisions were made sequentially at L4-5 L3-4 and L2-3  levels. Posterior finger dissection was made to enter the retroperitoneal space and then subsequently the probe was inserted into the psoas muscle from the left side initially at the L4-5 level. After mapping the neural elements were able to dock the probe per the midpoint of this vertebral level and without indications electrically of too close proximity to the neural tissues. Subsequently the self-retaining tractor was.after sequential dilators were utilized the shim was employed and the interspace was cleared of psoas muscle and then incised. A thorough discectomy was performed. Instruments were used to clear the interspace of disc material. After thorough discectomy was performed and this was performed using AP and lateral fluoroscopy a 10 lordotic by 50 x 22 mm implant was packed with BMP and Atrax synthetic bone graft material. This was tamped into position using the slides and its position was confirmed on AP and lateral fluoroscopy. Subsequently exposure was performed at the L3-4 level and similar dissection was performed with locking of the self-retaining retractor. At this level were able to place a 10 lordotic by 22 x 50 mm implant packed in a similar fashion. At the L2-3 level were able to place an 8 mm standard by 45 x 22 mm implant packed in a similar fashion. Hemostasis was assured the wounds were irrigated and closed with interrupted Vicryl sutures.  Patient was then turned into a prone position on the operating table on Jackson Table  using AP and lateral fluoroscopy throughout this portion of the procedure,  Prior to placing all screws, a MAS TLIF was performed with thorough decompression of the thecal  sac and L5 and S1 nerve roots with facetectomy at the L 5 S 1 level on the left.  This bone was morselized and used for autograft.  An 8 x 34 x 24  mm 8 degree lordosis Nuvasive MLX TLIF implant was packed with BMP and autograft and allograft after placing the autograft in the interspace. pedicle  screws were placed using Nuvasive cannulated percutaneous screws. 2 screws were placed at L 2, L 3 and (6.5 x 40 mm) and 2 at L4 of similar size and 2 6.5 x 40 at L5 and 2 at S 1. Remaining screws were placed and 120 mm rods were passed  And the screws were locked, towers torqued and disassembled.Final Xrays showed well positioned implants and screw fixation. The wounds were irrigated and then closed with 1, 2-0 and 3-0 Vicryl stitches. Sterile occlusive dressing was placed with Dermabond. The patient was then extubated in the operating room and taken to recovery in stable and satisfactory condition having tolerated his operation well. Counts were correct at the end of the case.  PLAN OF CARE: Admit to inpatient   PATIENT DISPOSITION:  PACU - hemodynamically stable.   Delay start of Pharmacological VTE agent (>24hrs) due to surgical blood loss or risk of bleeding: yes

## 2015-11-09 NOTE — Transfer of Care (Signed)
Immediate Anesthesia Transfer of Care Note  Patient: NELSA KUTZLER  Procedure(s) Performed: Procedure(s): Lumbar two-three,lumbar three-four.lumbar four-five Anterior Lateral Lumbar Fusion (Left) POSTERIOR LUMBAR FUSION LUMBAR FIVE-SACRAL ONE with Pedicle Screws LUMBAR TWO TO LUMBAR SACRAL ONE (N/A)  Patient Location: PACU  Anesthesia Type:General  Level of Consciousness: awake and alert   Airway & Oxygen Therapy: Patient Spontanous Breathing and Patient connected to nasal cannula oxygen  Post-op Assessment: Report given to RN and Post -op Vital signs reviewed and stable  Post vital signs: Reviewed and stable  Last Vitals:  Filed Vitals:   11/09/15 0725 11/09/15 0726  BP:  123/61  Pulse:  69  Temp: 36.7 C   Resp:  20    Complications: No apparent anesthesia complications

## 2015-11-09 NOTE — H&P (Signed)
Patient ID:   [id number] Patient: Bonnie Weeks  Date of Birth: May 23, 1946 Visit Type: Office Visit   Date: 11/08/2015 10:30 AM Provider: Marchia Meiers. Vertell Limber MD   This 68 year old female presents for back pain and Leg pain.  History of Present Illness: 1.  back pain  2.  Leg pain  Bonnie Weeks returns for LSO fitting and perioperative teaching.  Left side XLIF L2-L5, with percutaneous pedicle screws, and left-sided T ALIF approach L5-S1 expandable cage is scheduled for tomorrow.      Medical/Surgical/Interim History Reviewed, no change.  Last detailed document date:10/27/2015.   Family History: Reviewed, no changes.  Last detailed document: 10/27/2015.   Social History: Tobacco use reviewed. Reviewed, no changes. Last detailed document date: 10/27/2015.      MEDICATIONS(added, continued or stopped this visit): Started Medication Directions Instruction Stopped   anastrozole 1 mg tablet Take as directed     cyclobenzaprine 10 mg tablet Take as directed     gabapentin 300 mg capsule Take as directed     hydrochlorothiazide 25 mg tablet Take as directed     lansoprazole 30 mg capsule,delayed release Take as directed     losartan 50 mg-hydrochlorothiazide 12.5 mg tablet Take as directed     montelukast 10 mg tablet Take as directed    11/08/2015 Norco 10 mg-325 mg tablet take 1/2-1 tablet by oral route  every 4-6 hours as needed for pain     Norco 5 mg-325 mg tablet Take as directed  11/08/2015   sertraline Take as directed     simvastatin 20 mg tablet Take as directed     Zyrtec 10 mg tablet Take as directed       ALLERGIES: Ingredient Reaction Medication Name Comment  NO KNOWN ALLERGIES     No known allergies. Reviewed, no changes.    Vitals Date Temp F BP Pulse Ht In Wt Lb BMI BSA Pain Score  11/08/2015  148/83 76 64 203 34.84  7/10      IMPRESSION Bonnie Weeks is doing well, hopeful for uncomplicated surgery tomorrow and home before  Christmas.  Assessment/Plan # Detail Type Description   1. Assessment Lumbar radiculopathy (M54.16).       2. Assessment Spinal stenosis, lumbar region (M48.06).       3. Assessment Scoliosis (and kyphoscoliosis), idiopathic (M41.20).         Pain Assessment/Treatment Pain Scale: 7/10. Method: Numeric Pain Intensity Scale. Location: back/leg. Onset: 10/06/1995. Duration: varies. Quality: discomforting. Pain Assessment/Treatment follow-up plan of care: Patient is taking medications as prescribed..  Fall Risk Plan The patient has not fallen in the last year.  Planned surgery is discussed, with postop expectations for hospitalization and home.  Discharge instructions are reviewed.  LSO is fitted.  Mrs. steva mulanax understanding and agrees with the plan.  She will return for postop x-rays and evaluation in 3-4 weeks.    MEDICATIONS PRESCRIBED TODAY    Rx Quantity Refills  NORCO 10 mg-325 mg  60 0            Provider:  Marchia Meiers. Vertell Limber MD  11/08/2015 11:58 AM Dictation edited by: Mike Craze. Poteat RN    CC Providers: Erline Levine MD [address] [place], [place] [address]              Electronically signed by Marchia Meiers. Vertell Limber MD on 11/08/2015 02:40 PM  Patient ID:   [id number] Patient: Bonnie Weeks  Date of Birth: [birth year] Visit Type: Office Visit  Date: 10/27/2015 03:00 PM Provider: Marchia Meiers. Vertell Limber MD   This 69 year old female presents for back pain and Leg pain.  History of Present Illness: 1.  back pain  2.  Leg pain  I have reviewed the patient's imaging studies with new MRI.  She is currently describing her pain is 8 out of 10 in severity and says the pain is severe and that her low back pain is extremely bothersome.  East on my review of her studies I believe that the surgery should be more extensive than I previously recommended and this would include XLIF procedure at L2-3, L3 L4, L4 L5 levels from the left side with L5-S1 PLIF with  percutaneous pedicle screw fixation L2 through S1 levels.  The patient has spondylolisthesis and scoliosis of her lumbar spine from L2 to the sacrum with left-sided S1 nerve root compression at the L5-S1 level.        PAST MEDICAL/SURGICAL HISTORY   (Detailed)  Disease/disorder Onset Date Management Date Comments    Surgery, cervical spine 12/04/2014     Hernia repair    Depression      High cholesterol      Hypertension       DIAGNOSTICS HISTORY: Test Ordered Ordering Comments Modifier  Lumbar Spine- AP/Lat/Obls/Spot/Flex/Ex 10/06/2015      PAST MEDICAL HISTORY, SURGICAL HISTORY, FAMILY HISTORY, SOCIAL HISTORY AND REVIEW OF SYSTEMS I have reviewed the patient's past medical, surgical, family and social history as well as the comprehensive review of systems as included on the [facility] history form dated 10/06/2015, which I have signed.  Family History  (Detailed) Relationship Family Member Name Deceased Age at Death Condition Onset Age Cause of Death  Father    Emphysema  N  Mother    Congestive heart failure  N  Mother    Hypertension  N  Mother    Diabetes mellitus  N    SOCIAL HISTORY  (Detailed) Tobacco use reviewed. Preferred language is Unknown.   Smoking status: Never smoker.  SMOKING STATUS Use Status Type Smoking Status Usage Per Day Years Used Total Pack Years  no/never  Never smoker       HOME ENVIRONMENT/SAFETY The patient has not fallen in the last year.        MEDICATIONS(added, continued or stopped this visit): Started Medication Directions Instruction Stopped   anastrozole 1 mg tablet Take as directed     cyclobenzaprine 10 mg tablet Take as directed     gabapentin 300 mg capsule Take as directed     hydrochlorothiazide 25 mg tablet Take as directed     lansoprazole 30 mg capsule,delayed release Take as directed     losartan 50 mg-hydrochlorothiazide 12.5 mg tablet Take as directed     montelukast 10 mg tablet  Take as directed     Norco 5 mg-325 mg tablet Take as directed     sertraline Take as directed     simvastatin 20 mg tablet Take as directed     Zyrtec 10 mg tablet Take as directed       ALLERGIES:    Vitals Date Temp F BP Pulse Ht In Wt Lb BMI BSA Pain Score  10/27/2015  137/83 91 64 201 34.5  7/10      IMPRESSION Proceed with surgery as described above.  The patient will be fitted for LSO brace and will come back to see Gaspar Bidding for preop teaching on 11/08/15.  Completed Orders (this encounter)  Order Details Reason Side Interpretation Result Initial Treatment Date Region  Lifestyle education regarding diet Encouraged to eat a well balanced diet and follow up with primary care physician.         Assessment/Plan # Detail Type Description   1. Assessment Low back pain, unspecified back pain laterality, with sciatica presence unspecified (M54.5).       2. Assessment Spinal stenosis, lumbar region (M48.06).       3. Assessment Scoliosis (and kyphoscoliosis), idiopathic (M41.20).       4. Assessment Lumbar radiculopathy (M54.16).       5. Assessment Body mass index (BMI) 34.0-34.9, adult (Z68.34).   Plan Orders Today's instructions / counseling include(s) Lifestyle education regarding diet.         Pain Assessment/Treatment Pain Scale: 7/10. Method: Numeric Pain Intensity Scale. Location: back/left leg. Onset: 10/06/1995. Duration: varies. Quality: discomforting. Pain Assessment/Treatment follow-up plan of care: Patient is taking medications as prescribed..  Fall Risk Plan The patient has not fallen in the last year.  Proceed with lumbar decompression and fusion L2 through sacral levels.  The risks and benefits were discussed in detail with the patient.  Her bone density is satisfactory.  She wishes to proceed.  Orders: Diagnostic Procedures: Assessment Procedure  M41.20 anterolateral interbody fusion left L2-L3, L3-L4, L4-L5; PLIF L5-S1, with percutaneous pedicle  screws L2-S1 bilaterally  Instruction(s)/Education: Assessment Instruction  Z68.34 Lifestyle education regarding diet             Provider:  Marchia Meiers. Vertell Limber MD  10/30/2015 02:24 PM Dictation edited by: Marchia Meiers. Vertell Limber    CC Providers: Erline Levine MD [address] [place], [place] [address]              Electronically signed by Marchia Meiers. Vertell Limber MD on 10/30/2015 02:24 PM  Patient ID:   [id number] Patient: Bonnie Weeks  Date of Birth: 07-27-1946 Visit Type: Office Visit   Date: 10/06/2015 02:30 PM Provider: Marchia Meiers. Vertell Limber MD   This 69 year old female presents for back pain and Leg pain.  History of Present Illness: 1.  back pain  2.  Leg pain  Raeshell Bonadonna, 69 year old retired female in [employer], [employer], visits reporting lumbar and left leg pain numbness and tingling.  She states she was referred to [facility] for same symptoms last year; but the physician recommended a cervical surgery instead.  She underwent the cervical surgery as recommended but has experienced continued even increased symptoms since that time.  She did not wish to return to that physician for evaluation of her lumbar symptoms.  Flexeril 10 mg daily at bedtime Norco 5/325 one only rarely gabapentin 300 mg twice a day Percogesic OTC daily at bedtime  Physical therapy, water therapy, massage therapy, dry needling, ESI's have offered little lasting relief.  History: HTN, breast cancer, heart valve Surgical history: Hiatal hernia repair 3, right breast lumpectomy 2008, cervical surgery in January 2016 [facility])  MRI and x-rays on [place]  The patient's imaging was reviewed.  This demonstrates on plain radiographs that she has dextral convex scoliosis lumbar spine.  In addition she has spondylolisthesis of L4 and L5 of 11 mm on extension, 11 mm on neutral lateral radiograph, and 12 mm on flexion.  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  Her scoliosis goes from L2 through L5 levels.  The patient describes pain all the time.  She describes that her outer left leg  and buttock caused her considerable amount of pain and she feels that her left foot and the bottom of her foot are ""raw"       PAST MEDICAL HISTORY, SURGICAL HISTORY, FAMILY HISTORY, SOCIAL HISTORY AND REVIEW OF SYSTEMS I have reviewed the patient's past medical, surgical, family and social history as well as the comprehensive review of systems as included on the [facility] history form dated 10/06/2015, which I have signed.   MEDICATIONS(added, continued or stopped this visit):   ALLERGIES: Ingredient Reaction Medication Name Comment  NO KNOWN ALLERGIES     No known allergies.    Vitals Date Temp F BP Pulse Ht In Wt Lb BMI BSA Pain Score  10/06/2015  114/71 91 64 205 35.19  6/10     PHYSICAL EXAM General Level of Distress: no acute distress Overall Appearance: normal  Head and Face  Right Left  Fundoscopic Exam:  normal normal    Cardiovascular Cardiac: regular rate and rhythm without murmur  Right Left  Carotid Pulses: normal normal  Respiratory Lungs: clear to auscultation  Neurological Orientation: normal Recent and Remote Memory: normal Attention Span and Concentration:   normal Language: normal Fund of Knowledge: normal  Right Left Sensation: normal normal Upper Extremity Coordination: normal normal  Lower Extremity Coordination: normal normal  Musculoskeletal Gait and Station: normal  Right Left Upper Extremity Muscle Strength: normal normal Lower Extremity Muscle Strength: normal normal Upper Extremity Muscle Tone:  normal normal Lower Extremity Muscle Tone: normal normal  Motor Strength Upper and lower extremity motor strength was tested in the clinically pertinent muscles. Any abnormal findings will be noted below.   Right Left EHL:  4/5   Deep Tendon  Reflexes  Right Left Biceps: normal normal Triceps: normal normal Brachiloradialis: normal normal Patellar: normal normal Achilles: normal normal  Sensory Sensation was tested at L1 to S1. Any abnormal findings will be noted below.  Right Left S1:  decreased  Cranial Nerves II. Optic Nerve/Visual Fields: normal III. Oculomotor: normal IV. Trochlear: normal V. Trigeminal: normal VI. Abducens: normal VII. Facial: normal VIII. Acoustic/Vestibular: normal IX. Glossopharyngeal: normal X. Vagus: normal XI. Spinal Accessory: normal XII. Hypoglossal: normal  Motor and other Tests Lhermittes: negative Rhomberg: negative Pronator drift: absent     Right Left Hoffman's: normal normal Clonus: normal normal Babinski: normal normal SLR: negative positive Patrick's Corky Sox): negative negative Toe Walk: normal normal Toe Lift: normal normal Heel Walk: normal normal SI Joint: nontender nontender   Additional Findings:  Patient has left hip abductor weakness at 4 out of 5.  She has left sciatic notch and lumbosacral junction pain to palpation.  She complains of numbness in both of her hands.    IMPRESSION The patient has spinal stenosis and spondylolisthesis at the L4 L5 level and in addition has weakness in her left leg in an L5 distribution.  His scoliosis and degenerative changes at the L2 through L5 levels as well.  She has not had an MRI for approximately a year and a half.  I explained to her that she would require repeat imaging as if there is been significant change this is likely going to effect to this specific recommendations that I have for her.  I do think she is going to require surgery for spondylolisthesis and stenosis as well as scoliosis.  Completed Orders (this encounter) Order Details Reason Side Interpretation Result Initial Treatment Date Region  Lumbar Spine- AP/Lat/Obls/Spot/Flex/Ex      10/06/2015 All Levels to All  Levels   Assessment/Plan # Detail Type  Description   1. Assessment Spondylolisthesis, lumbar region (M43.16).       2. Assessment Scoliosis (and kyphoscoliosis), idiopathic (M41.20).       3. Assessment Low back pain, unspecified back pain laterality, with sciatica presence unspecified (M54.5).       4. Assessment Lumbar radiculopathy (M54.16).       5. Assessment Lumbar stenosis (M48.06).         Pain Assessment/Treatment Location: back/left leg. Onset: 10/06/1995. Duration: varies. Quality: discomforting. Pain Assessment/Treatment follow-up plan of care: Patient is taking medications as prescribed..  Patient will return after repeat lumbar imaging to make further recommendations.  I do think she will require surgery but the specific surgery will depend on change in her imaging.  We have tentatively booked decompression and fusion at the L4 L5 level on 11/09/15 but this in fact may be changed to a more involved procedure of XLIF at L2-3, L3 L4, L4 L5 levels with percutaneous screw fixation depending on evolution of her scoliosis and degenerative changes at the other levels of her lumbar spine.  Orders: Diagnostic Procedures: Assessment Procedure  M48.06 Lumbar Fusion  M48.06 MRI Spine/lumb W/o Contrast  M48.06 Return to Clinic after study is performed  M54.16 Lumbar Spine- AP/Lat/Obls/Spot/Flex/Ex             Provider:  Marchia Meiers. Vertell Limber MD  10/09/2015 03:22 PM Dictation edited by: Marchia Meiers. Vertell Limber    CC Providers: Erline Levine MD [address] [place], [place] [address]              Electronically signed by Marchia Meiers. Vertell Limber MD on 10/09/2015 03:22 PM

## 2015-11-09 NOTE — Interval H&P Note (Signed)
History and Physical Interval Note:  11/09/2015 7:12 AM  Bonnie Weeks  has presented today for surgery, with the diagnosis of Lumbar stenosis  The various methods of treatment have been discussed with the patient and family. After consideration of risks, benefits and other options for treatment, the patient has consented to  Procedure(s): L2-3/3-4/4-5 Anterior Lateral Lumbar Fusion (Left) POSTERIOR LUMBAR FUSION L5-S1 with Pedicle Screws L2-S1 1 LEVEL (N/A) as a surgical intervention .  The patient's history has been reviewed, patient examined, no change in status, stable for surgery.  I have reviewed the patient's chart and labs.  Questions were answered to the patient's satisfaction.     Serrina Minogue D

## 2015-11-09 NOTE — Progress Notes (Signed)
Fentanyl 50 mcq IV wasted in sink and flushed. Witnessed by Lennice Sites NT.

## 2015-11-09 NOTE — Progress Notes (Signed)
Awake, alert, conversant.  MAEW with good PF, DF strength.  Doing well.

## 2015-11-09 NOTE — Progress Notes (Signed)
ANTIBIOTIC CONSULT NOTE - INITIAL  Pharmacy Consult for vancomycin Indication: surgical px, MRSA PCR +  Allergies  Allergen Reactions  . Procaine Other (See Comments)    Headache   . Hydrocodone-Acetaminophen Itching  . Oxycodone Nausea And Vomiting  . Shellfish-Derived Products Other (See Comments)    Unspecified    Patient Measurements: Height: 5\' 4"  (162.6 cm) Weight: 201 lb (91.173 kg) IBW/kg (Calculated) : 54.7   Vital Signs: Temp: 97.4 F (36.3 C) (12/20 1715) Temp Source: Oral (12/20 0725) BP: 125/68 mmHg (12/20 1725) Pulse Rate: 66 (12/20 1725) Intake/Output from previous day:   Intake/Output from this shift: Total I/O In: 2450 [I.V.:2200; IV Piggyback:250] Out: 590 [Urine:490; Blood:100]  Labs: No results for input(s): WBC, HGB, PLT, LABCREA, CREATININE in the last 72 hours. Estimated Creatinine Clearance: 74.7 mL/min (by C-G formula based on Cr of 0.68). No results for input(s): VANCOTROUGH, VANCOPEAK, VANCORANDOM, GENTTROUGH, GENTPEAK, GENTRANDOM, TOBRATROUGH, TOBRAPEAK, TOBRARND, AMIKACINPEAK, AMIKACINTROU, AMIKACIN in the last 72 hours.   Microbiology: Recent Results (from the past 720 hour(s))  Surgical pcr screen     Status: Abnormal   Collection Time: 10/27/15  1:05 PM  Result Value Ref Range Status   MRSA, PCR POSITIVE (A) NEGATIVE Final   Staphylococcus aureus POSITIVE (A) NEGATIVE Final    Comment:        The Xpert SA Assay (FDA approved for NASAL specimens in patients over 74 years of age), is one component of a comprehensive surveillance program.  Test performance has been validated by [facility] for patients greater than or equal to 30 year old. It is not intended to diagnose infection nor to guide or monitor treatment.     Medical History: Past Medical History  Diagnosis Date  . Hypertension   . Cancer [facility])     right breast cancer  . History of hiatal hernia   . Back pain   . Asthma   . Leaky heart valve   . Headache    . Depression   . Hyperlipidemia     Medications:  Prescriptions prior to admission  Medication Sig Dispense Refill Last Dose  . anastrozole (ARIMIDEX) 1 MG tablet Take 1 mg by mouth daily.   11/09/2015 at 0500  . cetirizine (ZYRTEC) 10 MG tablet Take 10 mg by mouth daily.   11/09/2015 at 0500  . cyclobenzaprine (FLEXERIL) 10 MG tablet Take 10 mg by mouth 3 (three) times daily as needed for muscle spasms.   Past Week at Unknown time  . gabapentin (NEURONTIN) 300 MG capsule Take 300 mg by mouth 3 (three) times daily.   11/09/2015 at 0500  . hydrochlorothiazide (HYDRODIURIL) 25 MG tablet Take 25 mg by mouth daily.   11/08/2015 at Unknown time  . HYDROcodone-acetaminophen (NORCO/VICODIN) 5-325 MG tablet Take 1 tablet by mouth every 6 (six) hours as needed for moderate pain.   11/09/2015 at 0500  . lansoprazole (PREVACID) 30 MG capsule Take 30 mg by mouth daily at 12 noon.   11/08/2015 at Unknown time  . losartan-hydrochlorothiazide (HYZAAR) 50-12.5 MG tablet Take 1 tablet by mouth daily.   11/08/2015 at Unknown time  . montelukast (SINGULAIR) 10 MG tablet Take 10 mg by mouth at bedtime.   11/08/2015 at Unknown time  . sertraline (ZOLOFT) 100 MG tablet Take 150 mg by mouth daily.   11/08/2015 at Unknown time  . simvastatin (ZOCOR) 20 MG tablet Take 20 mg by mouth daily.   11/08/2015 at Unknown time   Assessment: 69 yo F s/p  multiple lumbar fusions.  No drains in place.  Pharmacy consulted to dose vancomycin for post-op prophylaxis in MRSR PCR nasal swab + pt.  Wt 92.1 kg, She was given ancef 2 gm at 1030 and 1415.  AF,   Plan:  Vancomycin 1 gm x 1 dose at 2200. Pharmacy to sign off. Thanks  Eudelia Bunch, Pharm.D. [phone] 11/09/2015 5:35 PM

## 2015-11-10 ENCOUNTER — Encounter (HOSPITAL_COMMUNITY): Payer: Self-pay | Admitting: Neurosurgery

## 2015-11-10 NOTE — Anesthesia Postprocedure Evaluation (Signed)
Anesthesia Post Note  Patient: ITZELA RANDALL  Procedure(s) Performed: Procedure(s) (LRB): Lumbar two-three,lumbar three-four.lumbar four-five Anterior Lateral Lumbar Fusion (Left) POSTERIOR LUMBAR FUSION LUMBAR FIVE-SACRAL ONE with Pedicle Screws LUMBAR TWO TO LUMBAR SACRAL ONE (N/A)  Patient location during evaluation: PACU Anesthesia Type: General Level of consciousness: awake and awake and alert Pain management: pain level controlled Vital Signs Assessment: post-procedure vital signs reviewed and stable Respiratory status: spontaneous breathing and nonlabored ventilation Anesthetic complications: no    Last Vitals:  Filed Vitals:   11/10/15 1436 11/10/15 1704  BP: 124/57 111/53  Pulse: 83 85  Temp: 36.8 C 37 C  Resp: 20 20    Last Pain:  Filed Vitals:   11/10/15 1730  PainSc: 4                  Britini Garcilazo COKER

## 2015-11-10 NOTE — Progress Notes (Signed)
Pt arrived on unit via transfer stretcher from PACU. Pt was alert and oriented and made aware of conditions and surroundings. Pt was complaining of pain on arrival and nausea. Pt was repositioned to help alleviate pain until pain medicine could be verified from pharmacy. Pt's family informed of patient's location and isolation conditions.

## 2015-11-10 NOTE — Evaluation (Signed)
Physical Therapy Evaluation Patient Details Name: Bonnie Weeks MRN: [id number] DOB: 03-22-1946 Today's Date: 11/10/2015   History of Present Illness  Pt is a 69 y/o female who presents s/p L2-L5 anterior lateral fusion, and L5-S1 PLIF on 11/09/15.  Clinical Impression  Pt admitted with above diagnosis. Pt currently with functional limitations due to the deficits listed below (see PT Problem List). At the time of PT eval pt was able to perform transfers and ambulation with min assist. Pt moving very slow and guarded, but was able to participate in session with encouragement. Pt will benefit from skilled PT to increase their independence and safety with mobility to allow discharge to the venue listed below.       Follow Up Recommendations Home health PT;Supervision for mobility/OOB    Equipment Recommendations  Rolling walker with 5" wheels;3in1 (PT)    Recommendations for Other Services       Precautions / Restrictions Precautions Precautions: Fall;Back Precaution Booklet Issued: No Precaution Comments: Pt and husband were educated on back precautions throughout functional mobility.  Required Braces or Orthoses: Spinal Brace Spinal Brace: Lumbar corset;Applied in sitting position Restrictions Weight Bearing Restrictions: No      Mobility  Bed Mobility               General bed mobility comments: Pt sitting up in recliner upon PT arrival.   Transfers Overall transfer level: Needs assistance Equipment used: Rolling walker (2 wheeled) Transfers: Sit to/from Stand Sit to Stand: Min assist         General transfer comment: Assist to power-up to full standing position edge of chair.   Ambulation/Gait Ambulation/Gait assistance: Min assist Ambulation Distance (Feet): 40 Feet Assistive device: Rolling walker (2 wheeled) Gait Pattern/deviations: Step-through pattern;Decreased stride length;Narrow base of support Gait velocity: Decreased Gait velocity interpretation:  Below normal speed for age/gender General Gait Details: Pt moving very slow and guarded. Pt required occasional assist for walker management, and for balance support. Pt reaching outside of walker for support, and required cueing to keep hands on walker for safety.   Stairs            Wheelchair Mobility    Modified Rankin (Stroke Patients Only)       Balance Overall balance assessment: Needs assistance Sitting-balance support: Feet supported;No upper extremity supported Sitting balance-Leahy Scale: Fair     Standing balance support: Bilateral upper extremity supported;During functional activity Standing balance-Leahy Scale: Poor Standing balance comment: Requires UE support.                              Pertinent Vitals/Pain Pain Assessment: Faces Faces Pain Scale: Hurts whole lot Pain Location: Back Pain Descriptors / Indicators: Operative site guarding;Discomfort Pain Intervention(s): Limited activity within patient's tolerance;Monitored during session;Repositioned    Home Living Family/patient expects to be discharged to:: Private residence Living Arrangements: Spouse/significant other Available Help at Discharge: Family;Available 24 hours/day Type of Home: House Home Access: Stairs to enter   [employer] of Steps: 1 Home Layout: One level        Prior Function Level of Independence: Independent               Hand Dominance        Extremity/Trunk Assessment   Upper Extremity Assessment: Defer to OT evaluation           Lower Extremity Assessment: Generalized weakness      Cervical / Trunk Assessment: Normal  Communication   Communication: No difficulties  Cognition Arousal/Alertness: Awake/alert Behavior During Therapy: WFL for tasks assessed/performed Overall Cognitive Status: Within Functional Limits for tasks assessed                      General Comments General comments (skin integrity, edema,  etc.): Pt reports she has "esophagus issues" and had an episode of difficulty breathing. Pt had to sit and rest and breathe through the episode and it passed within 2 minutes. Husband reports this happens often at home.     Exercises        Assessment/Plan    PT Assessment Patient needs continued PT services  PT Diagnosis Difficulty walking;Acute pain   PT Problem List Decreased strength;Decreased range of motion;Decreased activity tolerance;Decreased mobility;Decreased balance;Decreased knowledge of use of DME;Decreased safety awareness;Decreased knowledge of precautions;Pain  PT Treatment Interventions DME instruction;Gait training;Stair training;Functional mobility training;Therapeutic activities;Therapeutic exercise;Neuromuscular re-education;Patient/family education   PT Goals (Current goals can be found in the Care Plan section) Acute Rehab PT Goals Patient Stated Goal: Return home - decrease pain PT Goal Formulation: With patient/family Time For Goal Achievement: 11/24/15 Potential to Achieve Goals: Good    Frequency Min 5X/week   Barriers to discharge        Co-evaluation               End of Session Equipment Utilized During Treatment: Back brace Activity Tolerance: Patient limited by fatigue;Patient limited by pain Patient left: in chair;with call bell/phone within reach;with family/visitor present Nurse Communication: Mobility status         Time: [id number] PT Time Calculation (min) (ACUTE ONLY): 33 min   Charges:   PT Evaluation $Initial PT Evaluation Tier I: 1 Procedure PT Treatments $Gait Training: 8-22 mins   PT G Codes:        Rolinda Roan November 15, 2015, 9:50 AM   Rolinda Roan, PT, DPT Acute Rehabilitation Services Pager: [phone]

## 2015-11-10 NOTE — Care Management Note (Addendum)
Case Management Note  Patient Details  Name: RAKISHA HEE MRN: [id number] Date of Birth: [birth year]  Subjective/Objective:    Patient admitted to have L2-5 ALIF and L 5-S1 PLIF. Patient lives at home with her spouse.             Action/Plan:  PT recommending HHPT. Awaiting OT recommendations. CM will continue to follow for discharge needs.   Expected Discharge Date:                  Expected Discharge Plan:     In-House Referral:     Discharge planning Services     Post Acute Care Choice:    Choice offered to:     DME Arranged:    DME Agency:     HH Arranged:    HH Agency:     Status of Service:  In process, will continue to follow  Medicare Important Message Given:    Date Medicare IM Given:    Medicare IM give by:    Date Additional Medicare IM Given:    Additional Medicare Important Message give by:     If discussed at [place] of Stay Meetings, dates discussed:    Additional Comments:  Pollie Friar, RN 11/10/2015, 10:59 AM

## 2015-11-10 NOTE — Progress Notes (Signed)
Pt oob and ambulated  In room to door entrance and back to chair in room about 60-80 feet.  Foley d/c Patient due to void   in 6 hrs. Medicated for pain.

## 2015-11-10 NOTE — Progress Notes (Signed)
Occupational Therapy Evaluation Patient Details Name: Bonnie Weeks MRN: [id number] DOB: Sep 07, 1946 Today's Date: 11/10/2015    History of Present Illness Pt is a 69 y/o female who presents s/p L2-L5 anterior lateral fusion, and L5-S1 PLIF on 11/09/15.   Clinical Impression   PTA, pt was independent with ADLs and mobility. OT evaluation limited due to pt's severe lethargy after receiving IV pain medication and was unable to provide any home information (husband not present). Pt required min-mod assist for mobility and ADLs due to pain and level of arousal. Pt will benefit from acute skilled OT to increase safety and independence with ADLs and functional mobility to allow safe discharge home. Recommending HHOT initially. DME needs listed below but may change once pt or husband able to provide information.    Follow Up Recommendations  Home health OT;Supervision/Assistance - 24 hour    Equipment Recommendations  Other (comment);3 in 1 bedside comode (RW-2 wheeled)    Recommendations for Other Services       Precautions / Restrictions Precautions Precautions: Fall;Back Precaution Booklet Issued: Yes (comment) Precaution Comments: Pt unable to recall any back precautions at beginning of sessions. Reviewed back precautions, pt extremely lethargic at end of session and unable to recall any precautions at end of session. Required Braces or Orthoses: Spinal Brace Spinal Brace: Lumbar corset;Applied in sitting position Restrictions Weight Bearing Restrictions: No      Mobility Bed Mobility Overal bed mobility: Needs Assistance Bed Mobility: Rolling;Sidelying to Sit;Sit to Supine Rolling: Min assist Sidelying to sit: Mod assist;HOB elevated   Sit to supine: Max assist   General bed mobility comments: Reviewed log roll technique. Mod assist for trunk support and to scoot hips out to sit EOB. Max assist to lay down in bed due to severe lethargy and pt unable to initiate or follow  commands.  Transfers Overall transfer level: Needs assistance Equipment used: Rolling walker (2 wheeled) Transfers: Sit to/from Stand Sit to Stand: Mod assist;+2 safety/equipment         General transfer comment: Sit-stand x2. 1st attempt reuiqred mod assist for boost to stand and to maintain balance with L knee buckling. 2nd attempt required mod +2 assist due to severe lethargy and inability for pt to follow commands. Verbal cues to not pull up on RW and to keep hands in safe position.     Balance Overall balance assessment: Needs assistance Sitting-balance support: Bilateral upper extremity supported;Feet supported Sitting balance-Leahy Scale: Poor Sitting balance - Comments: Bilateral UE tremor sitting EOB - required min assist to maintain safe sitting position   Standing balance support: Bilateral upper extremity supported;During functional activity Standing balance-Leahy Scale: Poor Standing balance comment: Heavy reliance on UE support, severe knee buckling (especially L). LOB x4 and required assist to regain balance                            ADL Overall ADL's : Needs assistance/impaired                 Upper Body Dressing : Moderate assistance;Sitting;Cueing for sequencing Upper Body Dressing Details (indicate cue type and reason): Mod assist to don/doff back brace due to lethargy and inability to follow commands     Toilet Transfer: Minimal assistance;Cueing for safety;Cueing for sequencing;Ambulation;BSC;RW Toilet Transfer Details (indicate cue type and reason): Cues for step sequence and to feel BSC on back of legs before sitting Toileting- Clothing Manipulation and Hygiene: Maximal assistance;Cueing for safety;Cueing for back  precautions;Sit to/from stand       Functional mobility during ADLs: Minimal assistance;Cueing for safety;Cueing for sequencing;Rolling walker General ADL Comments: Pt needing to void on OT arrival-  evaluation limited by pt's  severe lethargy halfway through session. Pt moves extremely slowly and guarded - became extremely lethargic halfway through session after receving IV pain medication. Required max verbal and tactile cues for step and RW sequence, to not rest UEs on other surfaces (sink, wall) while ambulating, and to keep eyes open. Pt required min assist for RW stabilization and to maintain balance due to severe knee buckling (especially L). Reviewed back precautions, but pt too lethargic to recall any at end of session.     Vision Additional Comments: Unable to obtain hx due to lethargy and pt unable to keep eyes open   Perception     Praxis      Pertinent Vitals/Pain Pain Assessment: 0-10 Faces Pain Scale: Hurts whole lot Pain Location: back  Pain Descriptors / Indicators: Operative site guarding;Discomfort Pain Intervention(s): Limited activity within patient's tolerance;Monitored during session;Repositioned;RN gave pain meds during session     Hand Dominance     Extremity/Trunk Assessment Upper Extremity Assessment Upper Extremity Assessment: Generalized weakness (Bilateral arm tremors throughout entire session)   Lower Extremity Assessment Lower Extremity Assessment: Generalized weakness (Significant bilateral knee buckling upon standing/walking)   Cervical / Trunk Assessment Cervical / Trunk Assessment: Normal   Communication Communication Communication: No difficulties   Cognition Arousal/Alertness: Lethargic;Suspect due to medications Behavior During Therapy: [facility] for tasks assessed/performed Overall Cognitive Status: Impaired/Different from baseline Area of Impairment: Attention;Memory;Following commands;Problem solving;Awareness;Safety/judgement   Current Attention Level: Sustained Memory: Decreased recall of precautions;Decreased short-term memory Following Commands: Follows one step commands inconsistently;Follows one step commands with increased time Safety/Judgement: Decreased  awareness of safety Awareness: Emergent Problem Solving: Slow processing;Decreased initiation;Difficulty sequencing;Requires verbal cues;Requires tactile cues General Comments: Pt became extremely lethargic halfway through session after receiving IV pain medication   General Comments       Exercises       Shoulder Instructions      Home Living Family/patient expects to be discharged to:: Private residence Living Arrangements: Spouse/significant other Available Help at Discharge: Family;Available 24 hours/day Type of Home: House Home Access: Stairs to enter [employer] of Steps: 1   Home Layout: One level         Bathroom Toilet: Standard Bathroom Accessibility: Yes              Prior Functioning/Environment Level of Independence: Independent             OT Diagnosis: Generalized weakness;Cognitive deficits;Acute pain   OT Problem List: Decreased strength;Decreased range of motion;Decreased activity tolerance;Impaired balance (sitting and/or standing);Decreased coordination;Decreased cognition;Decreased safety awareness;Decreased knowledge of precautions;Decreased knowledge of use of DME or AE;Obesity;Pain   OT Treatment/Interventions: Self-care/ADL training;Therapeutic exercise;Energy conservation;DME and/or AE instruction;Therapeutic activities;Cognitive remediation/compensation;Patient/family education;Balance training    OT Goals(Current goals can be found in the care plan section) Acute Rehab OT Goals Patient Stated Goal: to get stronger OT Goal Formulation: With patient Time For Goal Achievement: 11/24/15 Potential to Achieve Goals: Good ADL Goals Pt Will Perform Grooming: with supervision;standing Pt Will Perform Lower Body Bathing: with supervision;with adaptive equipment;sitting/lateral leans;sit to/from stand Pt Will Perform Lower Body Dressing: with supervision;with adaptive equipment;sitting/lateral leans;sit to/from stand Pt Will  Transfer to Toilet: with supervision;ambulating;bedside commode Pt Will Perform Toileting - Clothing Manipulation and hygiene: with supervision;with adaptive equipment;sitting/lateral leans;sit to/from stand Pt Will Perform Tub/Shower Transfer: Tub transfer;with supervision;ambulating;3  in 1;rolling walker Additional ADL Goal #1: Pt/caregiver will independently don/doff back brace to increase independence with ADLs. Additional ADL Goal #2: Pt will demonstrate understanding of 3/3 back precautions to increase safety with ADLs.  OT Frequency: Min 3X/week   Barriers to D/C:            Co-evaluation              End of Session Equipment Utilized During Treatment: Gait belt;Rolling walker;Back brace Nurse Communication: Mobility status;Precautions;Other (comment) (Pt did not void bladder)  Activity Tolerance: Patient limited by lethargy;Patient limited by pain Patient left: in bed;with call bell/phone within reach;with bed alarm set;with nursing/sitter in room   Time: 1439-1534 OT Time Calculation (min): 55 min Charges:  OT General Charges $OT Visit: 1 Procedure OT Evaluation $Initial OT Evaluation Tier I: 1 Procedure OT Treatments $Self Care/Home Management : 38-52 mins G-Codes:    Redmond Baseman, OTR/L 12/06/15, 3:57 PM Pager: [phone]

## 2015-11-10 NOTE — Progress Notes (Signed)
Pt received in bed .was from PACU with S/ p posterior Lumber fusion  With nausea and severe pain initial assessment done and medicated with Pain medication with some effect . Dressing to back dry clean and intact. Assisted oob to chair thls  am

## 2015-11-10 NOTE — Progress Notes (Signed)
Subjective: Patient reports doing well.  Objective: Vital signs in last 24 hours: Temp:  [97.4 F (36.3 C)-98.8 F (37.1 C)] 98.8 F (37.1 C) (12/20 2208) Pulse Rate:  [64-90] 90 (12/20 2208) Resp:  [11-20] 20 (12/20 2208) BP: (111-163)/(68-83) 163/82 mmHg (12/20 2208) SpO2:  [94 %-99 %] 94 % (12/20 2208)  Intake/Output from previous day: 12/20 0701 - 12/21 0700 In: 3805 [P.O.:480; I.V.:3025; IV Piggyback:300] Out: 2150 [Urine:2050; Blood:100] Intake/Output this shift:    Physical Exam: Full strength both legs.  No numbness.  Dressings CDI.  Lab Results: No results for input(s): WBC, HGB, HCT, PLT in the last 72 hours. BMET No results for input(s): NA, K, CL, CO2, GLUCOSE, BUN, CREATININE, CALCIUM in the last 72 hours.  Studies/Results: Dg Lumbar Spine 2-3 Views  11/09/2015  CLINICAL DATA:  Left-sided approach L2-5 packs XLIF, L5-S1 PLIF with L2-S1 pedicle screws for stenosis. EXAM: LUMBAR SPINE - 2-3 VIEW; DG C-ARM GT 120 MIN COMPARISON:  None. FINDINGS: Vertebral body alignment and heights are within normal. There is mild spondylosis present. There is evidence of patient's posterior spinal fusion including bilateral pedicle screws from L2-S1 as hardware is intact. Intervertebral cages over the intervening disc spaces. Recommend correlation with findings at the time of the procedure. IMPRESSION: Postsurgical change compatible with posterior fusion from L2-S1 with hardware intact. Electronically Signed   By: Marin Olp M.D.   On: 11/09/2015 16:56   Dg C-arm Gt 120 Min  11/09/2015  CLINICAL DATA:  Left-sided approach L2-5 packs XLIF, L5-S1 PLIF with L2-S1 pedicle screws for stenosis. EXAM: LUMBAR SPINE - 2-3 VIEW; DG C-ARM GT 120 MIN COMPARISON:  None. FINDINGS: Vertebral body alignment and heights are within normal. There is mild spondylosis present. There is evidence of patient's posterior spinal fusion including bilateral pedicle screws from L2-S1 as hardware is intact.  Intervertebral cages over the intervening disc spaces. Recommend correlation with findings at the time of the procedure. IMPRESSION: Postsurgical change compatible with posterior fusion from L2-S1 with hardware intact. Electronically Signed   By: Marin Olp M.D.   On: 11/09/2015 16:56    Assessment/Plan: Doing well.  Mobilize with PT.    LOS: 1 day    Peggyann Shoals, MD 11/10/2015, 7:58 AM

## 2015-11-10 NOTE — Clinical Social Work Note (Signed)
CSW received referral for SNF.  Case discussed with case manager, and plan is to discharge home with home health.  CSW to sign off please re-consult if social work needs arise.  Ronda Rajkumar R. Justa Hatchell, MSW, LCSWA [phone]  

## 2015-11-11 LAB — GLUCOSE, CAPILLARY
GLUCOSE-CAPILLARY: 109 mg/dL — AB (ref 65–99)
Glucose-Capillary: 110 mg/dL — ABNORMAL HIGH (ref 65–99)

## 2015-11-11 MED ORDER — HYDROMORPHONE HCL 2 MG PO TABS
2.0000 mg | ORAL_TABLET | ORAL | Status: DC | PRN
Start: 2015-11-11 — End: 2015-11-12
  Administered 2015-11-11 – 2015-11-12 (×5): 2 mg via ORAL
  Filled 2015-11-11 (×5): qty 1

## 2015-11-11 NOTE — Progress Notes (Signed)
Occupational Therapy Treatment Patient Details Name: Bonnie Weeks MRN: [id number] DOB: Dec 17, 1945 Today's Date: 11/11/2015    History of present illness Pt is a 69 y/o female who presents s/p L2-L5 anterior lateral fusion, and L5-S1 PLIF on 11/09/15.   OT comments  Pt with improved performance today. Pt required min guard assist for mobility and demonstrating better adherence to back precautions. Educated pt/husband and practiced with AE and compensatory strategies for ADLs. Recommendations remain the same, but no DME recommendations at this time. Will continue to follow acutely to address OT needs and goals.   Follow Up Recommendations  Home health OT;Supervision/Assistance - 24 hour    Equipment Recommendations  None recommended by OT    Recommendations for Other Services      Precautions / Restrictions Precautions Precautions: Fall;Back Precaution Booklet Issued: No Precaution Comments: Reviewed back precautions. Pt able to recall 2/3 back precautions at end of session Required Braces or Orthoses: Spinal Brace Spinal Brace: Lumbar corset;Applied in sitting position Restrictions Weight Bearing Restrictions: No       Mobility Bed Mobility Overal bed mobility: Needs Assistance Bed Mobility: Rolling;Sit to Sidelying Rolling: Min guard Sidelying to sit: Mod assist       General bed mobility comments: HOB flat, no use of bedrails to simulate home environment but pt still grabbed onto bedrail when it was down for assist to position into sidelying. Mod assist to progress bilateral LE onto bed.  Transfers Overall transfer level: Needs assistance Equipment used: Rolling walker (2 wheeled) Transfers: Sit to/from Stand Sit to Stand: Min assist         General transfer comment: Min assist for boost to stand. Pt with easier transition with both hands on RW, but explained to pt/husband importance of someone stabilizing RW while she attempts this.     Balance Overall  balance assessment: Needs assistance Sitting-balance support: Feet supported;No upper extremity supported Sitting balance-Leahy Scale: Fair Sitting balance - Comments: Bilateral UE shaking still noted   Standing balance support: Bilateral upper extremity supported;During functional activity Standing balance-Leahy Scale: Poor Standing balance comment: Increased L knee buckling with grooming tasks at sink - required bilateral UE support                   ADL Overall ADL's : Needs assistance/impaired Eating/Feeding: Modified independent   Grooming: Wash/dry hands;Oral care;Min guard;Standing;Cueing for compensatory techniques Grooming Details (indicate cue type and reason): Educated on 2 cup method for oral care         Upper Body Dressing : Supervision/safety;Cueing for sequencing;Sitting Upper Body Dressing Details (indicate cue type and reason): Cues for proper way to doff back brace Lower Body Dressing: Set up;With adaptive equipment;Cueing for sequencing;Adhering to back precautions;Sitting/lateral leans Lower Body Dressing Details (indicate cue type and reason): AE to don/doff socks Toilet Transfer: Min guard;Cueing for safety;Ambulation;BSC;RW;Grab bars   Toileting- Clothing Manipulation and Hygiene: Set up;With adaptive equipment;Cueing for compensatory techniques;Adhering to back precautions;Sit to/from stand Toileting - Clothing Manipulation Details (indicate cue type and reason): toilet aid for pericare     Functional mobility during ADLs: Min guard;Rolling walker General ADL Comments: Reviewed back precautions and educated/practiced pt on compensatory strategies and AE for oral care, dressing, and pericare. Verbal cues throughout session to avoid bending especially at sink when bilateral LE become fatigued and pt leans over sink for support.      Vision  Perception     Praxis      Cognition   Behavior During Therapy: WFL for tasks  assessed/performed Overall Cognitive Status: Within Functional Limits for tasks assessed                       Extremity/Trunk Assessment               Exercises     Shoulder Instructions       General Comments      Pertinent Vitals/ Pain       Pain Assessment: 0-10 Pain Score: 7  Faces Pain Scale: Hurts even more Pain Location: back Pain Descriptors / Indicators: Aching;Grimacing;Operative site guarding Pain Intervention(s): Monitored during session;Limited activity within patient's tolerance;Repositioned;Patient requesting pain meds-RN notified  Home Living                                          Prior Functioning/Environment              Frequency Min 3X/week     Progress Toward Goals  OT Goals(current goals can now be found in the care plan section)  Progress towards OT goals: Progressing toward goals  Acute Rehab OT Goals Patient Stated Goal: Return home - decrease pain OT Goal Formulation: With patient Time For Goal Achievement: 11/24/15 Potential to Achieve Goals: Good ADL Goals Pt Will Perform Grooming: with supervision;standing Pt Will Perform Lower Body Bathing: with supervision;with adaptive equipment;sitting/lateral leans;sit to/from stand Pt Will Perform Lower Body Dressing: with supervision;with adaptive equipment;sitting/lateral leans;sit to/from stand Pt Will Transfer to Toilet: with supervision;ambulating;bedside commode Pt Will Perform Toileting - Clothing Manipulation and hygiene: with supervision;with adaptive equipment;sitting/lateral leans;sit to/from stand Pt Will Perform Tub/Shower Transfer: Tub transfer;with supervision;ambulating;3 in 1;rolling walker Additional ADL Goal #1: Pt/caregiver will independently don/doff back brace to increase independence with ADLs. Additional ADL Goal #2: Pt will demonstrate understanding of 3/3 back precautions to increase safety with ADLs.  Plan Discharge plan remains  appropriate    Co-evaluation                 End of Session Equipment Utilized During Treatment: Gait belt;Rolling walker;Back brace   Activity Tolerance Patient limited by pain   Patient Left in bed;with call bell/phone within reach;with bed alarm set;with family/visitor present   Nurse Communication Mobility status;Precautions;Patient requests pain meds        Time: 1510-1541 OT Time Calculation (min): 31 min  Charges: OT General Charges $OT Visit: 1 Procedure OT Treatments $Self Care/Home Management : 23-37 mins  Redmond Baseman, OTR/L 11/11/2015, 4:05 PM  Pager: [phone]

## 2015-11-11 NOTE — Discharge Summary (Signed)
Physician Discharge Summary  Patient ID: Bonnie Weeks MRN: [id number] DOB/AGE: 1946-09-21 69 y.o.  Admit date: 11/09/2015 Discharge date: 11/11/2015  Admission Diagnoses: Lumbar scoliosis, stenosis, spondylolisthesis, herniated lumbar disc, spondylosis, radiculopathy L 23, L 34, L 45, L 5 S1 levels   Discharge Diagnoses: Lumbar scoliosis, stenosis, spondylolisthesis, herniated lumbar disc, spondylosis, radiculopathy L 23, L 34, L 45, L 5 S1 levels s/p Lumbar two-three,lumbar three-four.lumbar four-five Anterior Lateral Lumbar Fusion (Left) POSTERIOR LUMBAR FUSION LUMBAR FIVE-SACRAL ONE with Pedicle Screws LUMBAR TWO TO LUMBAR SACRAL ONE (N/A)  Active Problems:   Lumbar spine scoliosis   Discharged Condition: good  Hospital Course: Bonnie Weeks was admitted for surgery with dx lumbar stenosis, spondylolisthesis, and radiculopathy.  Following uncomplicated [id number] fusion, she recovered nicely and transferred to [facility] for nursing care and therapies. She has progressed well.   Consults: None  Significant Diagnostic Studies: radiology: X-Ray: intra-op  Treatments: surgery: Lumbar two-three,lumbar three-four.lumbar four-five Anterior Lateral Lumbar Fusion (Left) POSTERIOR LUMBAR FUSION LUMBAR FIVE-SACRAL ONE with Pedicle Screws LUMBAR TWO TO LUMBAR SACRAL ONE (N/A)   Discharge Exam: Blood pressure 132/68, pulse 88, temperature 98.6 F (37 C), temperature source Oral, resp. rate 20, height 5\' 4"  (1.626 m), weight 91.173 kg (201 lb), SpO2 96 %. Alert, sitting in chair. Husband present. Pt reports significant lumbar pain, requiring IV Dilaudid for pain not controlled by Norco. incisions without erythema, swelling, or drainage. Honeycomb & Dermabond intact. Good strength BLE. Pt hopeful of d/c to home in am.   Disposition: Discharge to home.  Pt verbalizes understanding of d/c instructions and has f/u appt with DrStern in office. Will plan HHPT/OT. Dilaudid and Flexeril for prn home use.       Medication List    ASK your doctor about these medications        anastrozole 1 MG tablet  Commonly known as:  ARIMIDEX  Take 1 mg by mouth daily.     cetirizine 10 MG tablet  Commonly known as:  ZYRTEC  Take 10 mg by mouth daily.     cyclobenzaprine 10 MG tablet  Commonly known as:  FLEXERIL  Take 10 mg by mouth 3 (three) times daily as needed for muscle spasms.     gabapentin 300 MG capsule  Commonly known as:  NEURONTIN  Take 300 mg by mouth 3 (three) times daily.     hydrochlorothiazide 25 MG tablet  Commonly known as:  HYDRODIURIL  Take 25 mg by mouth daily.     HYDROcodone-acetaminophen 5-325 MG tablet  Commonly known as:  NORCO/VICODIN  Take 1 tablet by mouth every 6 (six) hours as needed for moderate pain.     lansoprazole 30 MG capsule  Commonly known as:  PREVACID  Take 30 mg by mouth daily at 12 noon.     losartan-hydrochlorothiazide 50-12.5 MG tablet  Commonly known as:  HYZAAR  Take 1 tablet by mouth daily.     montelukast 10 MG tablet  Commonly known as:  SINGULAIR  Take 10 mg by mouth at bedtime.     sertraline 100 MG tablet  Commonly known as:  ZOLOFT  Take 150 mg by mouth daily.     simvastatin 20 MG tablet  Commonly known as:  ZOCOR  Take 20 mg by mouth daily.         Signed: Verdis Prime 11/11/2015, 2:52 PM

## 2015-11-11 NOTE — Progress Notes (Addendum)
Physical Therapy Treatment Patient Details Name: SANTIA HARTELL MRN: [id number] DOB: June 16, 1946 Today's Date: 11/11/2015    History of Present Illness Pt is a 69 y/o female who presents s/p L2-L5 anterior lateral fusion, and L5-S1 PLIF on 11/09/15.    PT Comments    Pt making steady progress.  Follow Up Recommendations  Home health PT;Supervision for mobility/OOB     Equipment Recommendations  Rolling walker with 5" wheels;3in1 (PT)    Recommendations for Other Services       Precautions / Restrictions Precautions Precautions: Fall;Back Precaution Booklet Issued: No Required Braces or Orthoses: Spinal Brace Spinal Brace: Lumbar corset;Applied in sitting position Restrictions Weight Bearing Restrictions: No    Mobility  Bed Mobility Overal bed mobility: Needs Assistance Bed Mobility: Rolling;Sidelying to Sit Rolling: Min guard Sidelying to sit: Min assist       General bed mobility comments: Assist to elevate trunk into sitting.  Transfers Overall transfer level: Needs assistance Equipment used: Rolling walker (2 wheeled) Transfers: Sit to/from Stand Sit to Stand: Min assist;Min guard         General transfer comment: Pt with difficulty transitioning hand from pushing up from be onto walker. Easier transition with both hands on walker but pt understands someone should stabilize walker.  Ambulation/Gait Ambulation/Gait assistance: Min guard Ambulation Distance (Feet): 200 Feet Assistive device: Rolling walker (2 wheeled) Gait Pattern/deviations: Step-through pattern;Decreased stride length Gait velocity: Decreased Gait velocity interpretation: Below normal speed for age/gender General Gait Details: Pt with walker veering to left at times. Unsure if due to walker itself or pt's driving.   Stairs            Wheelchair Mobility    Modified Rankin (Stroke Patients Only)       Balance Overall balance assessment: Needs assistance Sitting-balance  support: No upper extremity supported;Feet supported Sitting balance-Leahy Scale: Fair     Standing balance support: No upper extremity supported Standing balance-Leahy Scale: Fair Standing balance comment: Can let go of walker and maintain static standing.                    Cognition Arousal/Alertness: Awake/alert Behavior During Therapy: WFL for tasks assessed/performed Overall Cognitive Status: Within Functional Limits for tasks assessed                      Exercises      General Comments        Pertinent Vitals/Pain Pain Assessment: Faces Faces Pain Scale: Hurts even more Pain Location: back Pain Descriptors / Indicators: Grimacing;Operative site guarding Pain Intervention(s): Limited activity within patient's tolerance;Premedicated before session    Home Living                      Prior Function            PT Goals (current goals can now be found in the care plan section) Acute Rehab PT Goals Patient Stated Goal: Return home - decrease pain Progress towards PT goals: Progressing toward goals    Frequency  Min 5X/week    PT Plan Current plan remains appropriate    Co-evaluation             End of Session Equipment Utilized During Treatment: Back brace;Gait belt Activity Tolerance: Patient tolerated treatment well Patient left: in chair;with call bell/phone within reach     Time: 1342-1409 PT Time Calculation (min) (ACUTE ONLY): 27 min  Charges:  $Gait Training: 23-37 mins  G Codes:      Keziyah Kneale 11/11/2015, 3:14 PM [place]

## 2015-11-11 NOTE — Progress Notes (Signed)
Subjective: Patient reports "i have back pain, but my legs aren't bothering me anymore"  Objective: Vital signs in last 24 hours: Temp:  [98.3 F (36.8 C)-99.3 F (37.4 C)] 98.6 F (37 C) (12/22 0950) Pulse Rate:  [85-92] 88 (12/22 0950) Resp:  [18-20] 20 (12/22 0950) BP: (111-132)/(50-68) 132/68 mmHg (12/22 0950) SpO2:  [94 %-97 %] 96 % (12/22 0950)  Intake/Output from previous day: 12/21 0701 - 12/22 0700 In: -  Out: 400 [Urine:400] Intake/Output this shift:    Alert, sitting in chair. Husband present. Pt reports significant lumbar pain, requiring IV Dilaudid for pain not controlled by Norco. incisions without erythema, swelling, or drainage. Honeycomb & Dermabond intact. Good strength BLE. Pt hopeful of d/c to home in am.  Lab Results: No results for input(s): WBC, HGB, HCT, PLT in the last 72 hours. BMET No results for input(s): NA, K, CL, CO2, GLUCOSE, BUN, CREATININE, CALCIUM in the last 72 hours.  Studies/Results: Dg Lumbar Spine 2-3 Views  11/09/2015  CLINICAL DATA:  Left-sided approach L2-5 packs XLIF, L5-S1 PLIF with L2-S1 pedicle screws for stenosis. EXAM: LUMBAR SPINE - 2-3 VIEW; DG C-ARM GT 120 MIN COMPARISON:  None. FINDINGS: Vertebral body alignment and heights are within normal. There is mild spondylosis present. There is evidence of patient's posterior spinal fusion including bilateral pedicle screws from L2-S1 as hardware is intact. Intervertebral cages over the intervening disc spaces. Recommend correlation with findings at the time of the procedure. IMPRESSION: Postsurgical change compatible with posterior fusion from L2-S1 with hardware intact. Electronically Signed   By: Marin Olp M.D.   On: 11/09/2015 16:56   Dg C-arm Gt 120 Min  11/09/2015  CLINICAL DATA:  Left-sided approach L2-5 packs XLIF, L5-S1 PLIF with L2-S1 pedicle screws for stenosis. EXAM: LUMBAR SPINE - 2-3 VIEW; DG C-ARM GT 120 MIN COMPARISON:  None. FINDINGS: Vertebral body alignment and  heights are within normal. There is mild spondylosis present. There is evidence of patient's posterior spinal fusion including bilateral pedicle screws from L2-S1 as hardware is intact. Intervertebral cages over the intervening disc spaces. Recommend correlation with findings at the time of the procedure. IMPRESSION: Postsurgical change compatible with posterior fusion from L2-S1 with hardware intact. Electronically Signed   By: Marin Olp M.D.   On: 11/09/2015 16:56    Assessment/Plan: Improving   LOS: 2 days  Per DrStern, d/c Norco & IV Dilaudid; add Dilaudid 2mg  1-2 po q4hrs prn pain. If tolerated and pain controlled, may d/c to home tomorrow. Pt verbalizes understanding of d/c instructions and has f/u appt with DrStern in office. Will plan HHPT/OT.    Verdis Prime 11/11/2015, 2:43 PM

## 2015-11-12 MED ORDER — HYDROMORPHONE HCL 2 MG PO TABS: 2.0000 mg | ORAL_TABLET | ORAL | Status: DC | PRN

## 2015-11-12 MED ORDER — CYCLOBENZAPRINE HCL 10 MG PO TABS: 10.0000 mg | ORAL_TABLET | Freq: Three times a day (TID) | ORAL | Status: DC | PRN

## 2015-11-12 NOTE — Progress Notes (Signed)
Patient ready for discharge to home; discharge instructions given and reviewed with patient and her spouse; Rx's given;patient discharged out via wheelchair; all belongings returned and equipment for use sent home with patient.

## 2015-11-12 NOTE — Care Management Note (Addendum)
Case Management Note  Patient Details  Name: Bonnie Weeks MRN: [id number] Date of Birth: 17-Sep-1946  Subjective/Objective:                    Action/Plan: Plan is to discharge patient home today with home health services. CM met with the patient and her husband and provided them alist of home health agencies in the [place], [place] area. They selected [facility]. CM spoke with [facility] and they accepted the referral noting that they would not be able to see patient until Wednesday or Thursday of next week. CM spoke with the patient's husband and he stated that would be fine. Information requested faxed to [facility]. Patient and husband state they have the 3 in 1 at home and PT recommending a youth walker. Jermaine with [facility] notified and will deliver the equipment to the room. Bedside RN updated.   Expected Discharge Date:                  Expected Discharge Plan:  [place]  In-House Referral:     Discharge planning Services  CM Consult  Post Acute Care Choice:  Durable Medical Equipment, Home Health Choice offered to:  Patient, Spouse  DME Arranged:  3-N-1, Walker rolling DME Agency:     HH Arranged:  PT, OT HH Agency:  [place]  Status of Service:  Completed, signed off  Medicare Important Message Given:    Date Medicare IM Given:    Medicare IM give by:    Date Additional Medicare IM Given:    Additional Medicare Important Message give by:     If discussed at [place] of Stay Meetings, dates discussed:    Additional Comments:  Pollie Friar, RN 11/12/2015, 9:59 AM

## 2015-11-12 NOTE — Progress Notes (Signed)
Subjective: Patient reports doing well  Objective: Vital signs in last 24 hours: Temp:  [98.6 F (37 C)-99 F (37.2 C)] 99 F (37.2 C) (12/23 0624) Pulse Rate:  [85-91] 90 (12/23 0624) Resp:  [18-20] 18 (12/23 0624) BP: (104-132)/(43-68) 121/51 mmHg (12/23 0624) SpO2:  [92 %-97 %] 97 % (12/23 0624)  Intake/Output from previous day:   Intake/Output this shift:    Physical Exam: Strength full.  Dressings CDI.  Lab Results: No results for input(s): WBC, HGB, HCT, PLT in the last 72 hours. BMET No results for input(s): NA, K, CL, CO2, GLUCOSE, BUN, CREATININE, CALCIUM in the last 72 hours.  Studies/Results: No results found.  Assessment/Plan: Discharge home.    LOS: 3 days    Peggyann Shoals, MD 11/12/2015, 6:38 AM

## 2015-11-12 NOTE — Progress Notes (Signed)
Physical Therapy Treatment Patient Details Name: Bonnie Weeks MRN: [id number] DOB: [birth year] Today's Date: 11/12/2015    History of Present Illness Pt is a 69 y/o female who presents s/p L2-L5 anterior lateral fusion, and L5-S1 PLIF on 11/09/15.    PT Comments    Pt progressing towards physical therapy goals. Pt initially reports that she was not able to participate due to pain, but was agreeable with some encouragement. Pt's husband present during session and was educated on proper assistance to provide during transfers. Recommending a youth walker for comfort as pt is having UE fatigue with taller walker use. Pt anticipates d/c home today.   Follow Up Recommendations  Home health PT;Supervision for mobility/OOB     Equipment Recommendations  Rolling walker with 5" wheels (youth)   Recommendations for Other Services       Precautions / Restrictions Precautions Precautions: Fall;Back Precaution Booklet Issued: No Precaution Comments: Reviewed back precautions during functional mobility Required Braces or Orthoses: Spinal Brace Spinal Brace: Lumbar corset;Applied in sitting position Restrictions Weight Bearing Restrictions: No    Mobility  Bed Mobility Overal bed mobility: Needs Assistance Bed Mobility: Rolling;Sit to Sidelying Rolling: Supervision       Sit to sidelying: Min assist General bed mobility comments: Pt required assist to elevate LE's onto the bed.   Transfers Overall transfer level: Needs assistance Equipment used: Rolling walker (2 wheeled) Transfers: Sit to/from Stand Sit to Stand: Min guard         General transfer comment: VC's for hand placement on seated surface for safety.   Ambulation/Gait Ambulation/Gait assistance: Min guard Ambulation Distance (Feet): 200 Feet Assistive device: Rolling walker (2 wheeled) Gait Pattern/deviations: Step-through pattern;Decreased stride length;Trunk flexed Gait velocity: Decreased Gait velocity  interpretation: Below normal speed for age/gender General Gait Details: Pt drifting to the L with RW. Is able to correct herself slowly with cues but does not seem to recognize she is drifting without cueing.    Stairs            Wheelchair Mobility    Modified Rankin (Stroke Patients Only)       Balance Overall balance assessment: Needs assistance Sitting-balance support: Feet supported;No upper extremity supported Sitting balance-Leahy Scale: Fair     Standing balance support: During functional activity;Single extremity supported Standing balance-Leahy Scale: Poor Standing balance comment: Statically                    Cognition Arousal/Alertness: Awake/alert Behavior During Therapy: WFL for tasks assessed/performed Overall Cognitive Status: Within Functional Limits for tasks assessed                      Exercises      General Comments        Pertinent Vitals/Pain Pain Assessment: 0-10 Pain Score: 8  Pain Location: incision Pain Descriptors / Indicators: Operative site guarding;Discomfort Pain Intervention(s): Limited activity within patient's tolerance;Monitored during session;Repositioned    Home Living                      Prior Function            PT Goals (current goals can now be found in the care plan section) Acute Rehab PT Goals Patient Stated Goal: Return home - decrease pain PT Goal Formulation: With patient/family Time For Goal Achievement: 11/24/15 Potential to Achieve Goals: Good Progress towards PT goals: Progressing toward goals    Frequency  Min 5X/week  PT Plan Current plan remains appropriate    Co-evaluation             End of Session Equipment Utilized During Treatment: Back brace Activity Tolerance: Patient tolerated treatment well Patient left: in chair;with call bell/phone within reach     Time: 1000-1023 PT Time Calculation (min) (ACUTE ONLY): 23 min  Charges:  $Gait Training:  23-37 mins                    G Codes:      Rolinda Roan 2015-11-25, 1:29 PM   Rolinda Roan, PT, DPT Acute Rehabilitation Services Pager: [phone]

## 2015-11-17 ENCOUNTER — Encounter (HOSPITAL_COMMUNITY): Payer: Self-pay | Admitting: Neurosurgery

## 2015-12-22 ENCOUNTER — Encounter (INDEPENDENT_AMBULATORY_CARE_PROVIDER_SITE_OTHER): Payer: Self-pay | Admitting: *Deleted

## 2016-07-04 ENCOUNTER — Other Ambulatory Visit (INDEPENDENT_AMBULATORY_CARE_PROVIDER_SITE_OTHER): Payer: Self-pay | Admitting: Internal Medicine

## 2016-07-04 DIAGNOSIS — Z1211 Encounter for screening for malignant neoplasm of colon: Secondary | ICD-10-CM

## 2016-07-07 ENCOUNTER — Telehealth (INDEPENDENT_AMBULATORY_CARE_PROVIDER_SITE_OTHER): Payer: Self-pay | Admitting: *Deleted

## 2016-07-07 NOTE — Telephone Encounter (Signed)
Referring MD/PCP: timothy brotherton   Procedure: tcs with propofol  Reason/Indication:  screening  Has patient had this procedure before?  Yes, 10 yrs ago  If so, when, by whom and where?    Is there a family history of colon cancer?  no  Who?  What age when diagnosed?    Is patient diabetic?   no      Does patient have prosthetic heart valve or mechanical valve?  no  Do you have a pacemaker?  no  Has patient ever had endocarditis? no  Has patient had joint replacement within last 12 months?  no  Does patient tend to be constipated or take laxatives? no  Does patient have a history of alcohol/drug use?  no  Is patient on Coumadin, Plavix and/or Aspirin? no  Medications: singulair 10 mg daily, zoloft 150 mg daily, lansoprazole 30 mg daily, cyclobenzaprine 10 mg bid, hctz 25 mg daily, losartan/hctz 50/12.5 mg daily, anestrozole 1 mg daily, zyrtec otc 10 mg daily, simvastatin 20 mg daily, gabapentin 300 mg tid, hydrocodone 10/325 mg daily  Allergies: oxycodone  Medication Adjustment:   Procedure date & time:

## 2016-07-31 ENCOUNTER — Encounter (INDEPENDENT_AMBULATORY_CARE_PROVIDER_SITE_OTHER): Payer: Self-pay | Admitting: *Deleted

## 2016-08-07 ENCOUNTER — Other Ambulatory Visit (HOSPITAL_COMMUNITY): Payer: BLUE CROSS/BLUE SHIELD

## 2016-08-11 ENCOUNTER — Ambulatory Visit (HOSPITAL_COMMUNITY): Admit: 2016-08-11 | Payer: Self-pay | Admitting: Internal Medicine

## 2016-08-11 ENCOUNTER — Encounter (HOSPITAL_COMMUNITY): Payer: Self-pay

## 2016-08-11 SURGERY — COLONOSCOPY WITH PROPOFOL
Anesthesia: Monitor Anesthesia Care

## 2016-08-23 ENCOUNTER — Encounter (INDEPENDENT_AMBULATORY_CARE_PROVIDER_SITE_OTHER): Payer: Self-pay | Admitting: Internal Medicine

## 2016-08-23 ENCOUNTER — Encounter (INDEPENDENT_AMBULATORY_CARE_PROVIDER_SITE_OTHER): Payer: Self-pay | Admitting: *Deleted

## 2016-08-23 ENCOUNTER — Ambulatory Visit (INDEPENDENT_AMBULATORY_CARE_PROVIDER_SITE_OTHER): Payer: Medicare Other | Admitting: Internal Medicine

## 2016-08-23 VITALS — BP 130/70 | HR 64 | Temp 98.3°F | Ht 64.0 in | Wt 193.5 lb

## 2016-08-23 DIAGNOSIS — R1310 Dysphagia, unspecified: Secondary | ICD-10-CM

## 2016-08-23 DIAGNOSIS — R1319 Other dysphagia: Secondary | ICD-10-CM

## 2016-08-23 NOTE — Progress Notes (Signed)
Subjective:    Patient ID: Bonnie Weeks, female    DOB: 1946-04-12, 70 y.o.   MRN: [id number]  HPI Referred by Dr. Lonia Chimera for a screening colonoscopy and EGD. Her last colonoscopy was greater than 10 yrs by Dr. West Carbo and she reports it was normal.  Her appetite is good. She has had some weight loss which was intentional.  She tells me she has some dysphagia.  She says sometimes foods are lodging in her esophagus. She has to go the bathroom and burp for the bolus to come back up. Occurs every day for a week and may go weeks without it bothering her.  No foods in particular bother her. She avoid breads.  Sometimes acid reflux bubbles up into her esophagus. Sometimes she has trouble swallowing water. Her acid reflux has been controlled for the most part.  She has normal BMs. No melena or BRRB. Hx of hiatal hernia x 3 .  Hx of EGD years ago (?41yrs by Dr. West Carbo).     Review of Systems Past Medical History:  Diagnosis Date  . Asthma   . Back pain   . Cancer [facility])    right breast cancer  . Depression   . Headache   . History of hiatal hernia   . Hyperlipidemia   . Hypertension   . Leaky heart valve     Past Surgical History:  Procedure Laterality Date  . ANTERIOR CERVICAL DECOMP/DISCECTOMY FUSION  11/2014   "[place]"  . ANTERIOR LAT LUMBAR FUSION Left 11/09/2015   Procedure: Lumbar two-three,lumbar three-four.lumbar four-five Anterior Lateral Lumbar Fusion;  Surgeon: Erline Levine, MD;  Location: [place] [facility];  Service: Neurosurgery;  Laterality: Left;  . BACK SURGERY    . BREAST LUMPECTOMY Right   . BUNIONECTOMY Right   . COLONOSCOPY    . HERNIA REPAIR     times 3  . Hiatal hernia repair    . POSTERIOR LUMBAR FUSION  11/09/2015   L5-S1 with Pedicle Screws L2-LUMBAR SACRAL ONE/NOTES 11/09/2015  . TONSILLECTOMY      Allergies  Allergen Reactions  . Procaine Other (See Comments)    Headache   . Hydrocodone-Acetaminophen Itching  . Oxycodone Nausea And  Vomiting  . Shellfish-Derived Products Other (See Comments)    Unspecified    Current Outpatient Prescriptions on File Prior to Visit  Medication Sig Dispense Refill  . anastrozole (ARIMIDEX) 1 MG tablet Take 1 mg by mouth daily.    . cetirizine (ZYRTEC) 10 MG tablet Take 10 mg by mouth daily.    . cyclobenzaprine (FLEXERIL) 10 MG tablet Take 1 tablet (10 mg total) by mouth 3 (three) times daily as needed for muscle spasms. 60 tablet 1  . gabapentin (NEURONTIN) 300 MG capsule Take 300 mg by mouth 3 (three) times daily.    . hydrochlorothiazide (HYDRODIURIL) 25 MG tablet Take 25 mg by mouth daily.    Marland Kitchen HYDROcodone-acetaminophen (NORCO/VICODIN) 5-325 MG tablet Take 1 tablet by mouth every 6 (six) hours as needed for moderate pain.    Marland Kitchen lansoprazole (PREVACID) 30 MG capsule Take 30 mg by mouth daily at 12 noon.    Marland Kitchen losartan-hydrochlorothiazide (HYZAAR) 50-12.5 MG tablet Take 1 tablet by mouth daily.    . montelukast (SINGULAIR) 10 MG tablet Take 10 mg by mouth at bedtime.    . sertraline (ZOLOFT) 100 MG tablet Take 150 mg by mouth daily.    . simvastatin (ZOCOR) 20 MG tablet Take 20 mg by mouth daily.    Marland Kitchen  cyclobenzaprine (FLEXERIL) 10 MG tablet Take 10 mg by mouth 3 (three) times daily as needed for muscle spasms.     No current facility-administered medications on file prior to visit.        Objective:   Physical Exam Blood pressure 130/70, pulse 64, temperature 98.3 F (36.8 C), height 5\' 4"  (1.626 m), weight 193 lb 8 oz (87.8 kg).  Alert and oriented. Skin warm and dry. Oral mucosa is moist.   . Sclera anicteric, conjunctivae is pink. Thyroid not enlarged. No cervical lymphadenopathy. Lungs clear. Heart regular rate and rhythm.  Abdomen is soft. Bowel sounds are positive. No hepatomegaly. No abdominal masses felt. No tenderness.  No edema to lower extremities. Patient is alert and oriented.       Assessment & Plan:  Dysphagia. Am going to an DG Esophagram. Once back will schedule  a colonoscopy, possible EGD with propofol

## 2016-08-23 NOTE — Patient Instructions (Signed)
DG esophagram. Further recommendations to follow.  

## 2016-09-04 ENCOUNTER — Telehealth (INDEPENDENT_AMBULATORY_CARE_PROVIDER_SITE_OTHER): Payer: Self-pay | Admitting: Internal Medicine

## 2016-09-04 NOTE — Telephone Encounter (Signed)
Patient called, stated she was returning your call.  [phone] home [phone] cell

## 2016-09-05 ENCOUNTER — Other Ambulatory Visit (INDEPENDENT_AMBULATORY_CARE_PROVIDER_SITE_OTHER): Payer: Self-pay | Admitting: Internal Medicine

## 2016-09-05 ENCOUNTER — Telehealth (INDEPENDENT_AMBULATORY_CARE_PROVIDER_SITE_OTHER): Payer: Self-pay | Admitting: Internal Medicine

## 2016-09-05 DIAGNOSIS — R1310 Dysphagia, unspecified: Secondary | ICD-10-CM

## 2016-09-05 DIAGNOSIS — Z1211 Encounter for screening for malignant neoplasm of colon: Secondary | ICD-10-CM | POA: Insufficient documentation

## 2016-09-05 DIAGNOSIS — R1319 Other dysphagia: Secondary | ICD-10-CM | POA: Insufficient documentation

## 2016-09-05 NOTE — Telephone Encounter (Signed)
Patient called, is upset because she has called numerous times and still has not gotten a call back.  I did call the patient and left her a message that Karna Christmas is off this afternoon, but I will have her view this message first thing in the morning.  [phone] home [phone] cell

## 2016-09-05 NOTE — Telephone Encounter (Signed)
Ann, Screening colonoscopy, EGd/Ed.

## 2016-09-06 ENCOUNTER — Encounter (INDEPENDENT_AMBULATORY_CARE_PROVIDER_SITE_OTHER): Payer: Self-pay | Admitting: *Deleted

## 2016-09-06 ENCOUNTER — Telehealth (INDEPENDENT_AMBULATORY_CARE_PROVIDER_SITE_OTHER): Payer: Self-pay | Admitting: *Deleted

## 2016-09-06 MED ORDER — PEG 3350-KCL-NA BICARB-NACL 420 G PO SOLR: 4000.0000 mL | Freq: Once | ORAL | 0 refills | Status: AC

## 2016-09-06 NOTE — Telephone Encounter (Signed)
TCS/EGD sch'd 09/22/16, preop 10/31 @ 3, patient aware, instructions mailed to patient

## 2016-09-06 NOTE — Telephone Encounter (Signed)
Patient needs trilyte 

## 2016-09-06 NOTE — Telephone Encounter (Signed)
I have spoken with patient and she is upset.

## 2016-09-07 ENCOUNTER — Encounter (INDEPENDENT_AMBULATORY_CARE_PROVIDER_SITE_OTHER): Payer: Self-pay

## 2016-09-15 ENCOUNTER — Other Ambulatory Visit (HOSPITAL_COMMUNITY): Payer: Medicare Other

## 2016-09-19 ENCOUNTER — Encounter (HOSPITAL_COMMUNITY): Admission: RE | Admit: 2016-09-19 | Payer: Medicare Other | Source: Ambulatory Visit

## 2016-09-19 ENCOUNTER — Encounter (INDEPENDENT_AMBULATORY_CARE_PROVIDER_SITE_OTHER): Payer: Self-pay | Admitting: *Deleted

## 2016-10-10 ENCOUNTER — Telehealth (INDEPENDENT_AMBULATORY_CARE_PROVIDER_SITE_OTHER): Payer: Self-pay | Admitting: *Deleted

## 2016-10-10 NOTE — Telephone Encounter (Signed)
Patient has bronchitis, on 2nd round of anitbx, TCS/EGD w propofol sch'd 10/20/16, wants to know if you would consider doing it without propofol, she has been told people can't be put to sleep when they have bronchial issues -- please advise

## 2016-10-10 NOTE — Telephone Encounter (Signed)
Call returned. Explained to the patient that she will be getting MAC and not general anesthesia. She is agreeable to proceeding with procedures under MAC

## 2016-10-11 NOTE — Patient Instructions (Signed)
Bonnie Weeks  10/11/2016     @PREFPERIOPPHARMACY @   Your procedure is scheduled on 10/20/2016.  Report to [facility] at 6:15  A.M.  Call this number if you have problems the morning of surgery:  [phone]   Remember:  Do not eat food or drink liquids after midnight.  Take these medicines the morning of surgery with A SIP OF WATER Zyrtec, Flexeril, Neurontin, Norco, Prevacid, Singulair, Zegrid, Zoloft   Do not wear jewelry, make-up or nail polish.  Do not wear lotions, powders, or perfumes, or deoderant.  Do not shave 48 hours prior to surgery.  Men may shave face and neck.  Do not bring valuables to the hospital.  [facility] is not responsible for any belongings or valuables.  Contacts, dentures or bridgework may not be worn into surgery.  Leave your suitcase in the car.  After surgery it may be brought to your room.  For patients admitted to the hospital, discharge time will be determined by your treatment team.  Patients discharged the day of surgery will not be allowed to drive home.    Please read over the following fact sheets that you were given. Anesthesia Post-op Instructions     PATIENT INSTRUCTIONS POST-ANESTHESIA  IMMEDIATELY FOLLOWING SURGERY:  Do not drive or operate machinery for the first twenty four hours after surgery.  Do not make any important decisions for twenty four hours after surgery or while taking narcotic pain medications or sedatives.  If you develop intractable nausea and vomiting or a severe headache please notify your doctor immediately.  FOLLOW-UP:  Please make an appointment with your surgeon as instructed. You do not need to follow up with anesthesia unless specifically instructed to do so.  WOUND CARE INSTRUCTIONS (if applicable):  Keep a dry clean dressing on the anesthesia/puncture wound site if there is drainage.  Once the wound has quit draining you may leave it open to air.  Generally you should leave the bandage intact for  twenty four hours unless there is drainage.  If the epidural site drains for more than 36-48 hours please call the anesthesia department.  QUESTIONS?:  Please feel free to call your physician or the hospital operator if you have any questions, and they will be happy to assist you.      Esophagogastroduodenoscopy Introduction Esophagogastroduodenoscopy (EGD) is a procedure to examine the lining of the esophagus, stomach, and first part of the small intestine (duodenum). This procedure is done to check for problems such as inflammation, bleeding, ulcers, or growths. During this procedure, a long, flexible, lighted tube with a camera attached (endoscope) is inserted down the throat. Tell a health care provider about:  Any allergies you have.  All medicines you are taking, including vitamins, herbs, eye drops, creams, and over-the-counter medicines.  Any problems you or family members have had with anesthetic medicines.  Any blood disorders you have.  Any surgeries you have had.  Any medical conditions you have.  Whether you are pregnant or may be pregnant. What are the risks? Generally, this is a safe procedure. However, problems may occur, including:  Infection.  Bleeding.  A tear (perforation) in the esophagus, stomach, or duodenum.  Trouble breathing.  Excessive sweating.  Spasms of the larynx.  A slowed heartbeat.  Low blood pressure. What happens before the procedure?  Follow instructions from your health care provider about eating or drinking restrictions.  Ask your health care provider about:  Changing or stopping your regular medicines. This  is especially important if you are taking diabetes medicines or blood thinners.  Taking medicines such as aspirin and ibuprofen. These medicines can thin your blood. Do not take these medicines before your procedure if your health care provider instructs you not to.  Plan to have someone take you home after the  procedure.  If you wear dentures, be ready to remove them before the procedure. What happens during the procedure?  To reduce your risk of infection, your health care team will wash or sanitize their hands.  An IV tube will be put in a vein in your hand or arm. You will get medicines and fluids through this tube.  You will be given one or more of the following:  A medicine to help you relax (sedative).  A medicine to numb the area (local anesthetic). This medicine may be sprayed into your throat. It will make you feel more comfortable and keep you from gagging or coughing during the procedure.  A medicine for pain.  A mouth guard may be placed in your mouth to protect your teeth and to keep you from biting on the endoscope.  You will be asked to lie on your left side.  The endoscope will be lowered down your throat into your esophagus, stomach, and duodenum.  Air will be put into the endoscope. This will help your health care provider see better.  The lining of your esophagus, stomach, and duodenum will be examined.  Your health care provider may:  Take a tissue sample so it can be looked at in a lab (biopsy).  Remove growths.  Remove objects (foreign bodies) that are stuck.  Treat any bleeding with medicines or other devices that stop tissue from bleeding.  Widen (dilate) or stretch narrowed areas of your esophagus and stomach.  The endoscope will be taken out. The procedure may vary among health care providers and hospitals. What happens after the procedure?  Your blood pressure, heart rate, breathing rate, and blood oxygen level will be monitored often until the medicines you were given have worn off.  Do not eat or drink anything until the numbing medicine has worn off and your gag reflex has returned. This information is not intended to replace advice given to you by your health care provider. Make sure you discuss any questions you have with your health care  provider. Document Released: 03/09/2005 Document Revised: 04/13/2016 Document Reviewed: 09/30/2015  2017 Elsevier Esophageal Dilatation Esophageal dilatation is a procedure to open a blocked or narrowed part of the esophagus. The esophagus is the long tube in your throat that carries food and liquid from your mouth to your stomach. The procedure is also called esophageal dilation. You may need this procedure if you have a buildup of scar tissue in your esophagus that makes it difficult, painful, or even impossible to swallow. This can be caused by gastroesophageal reflux disease (GERD). In rare cases, people need this procedure because they have cancer of the esophagus or a problem with the way food moves through the esophagus. Sometimes you may need to have another dilatation to enlarge the opening of the esophagus gradually. Tell a health care provider about:  Any allergies you have.  All medicines you are taking, including vitamins, herbs, eye drops, creams, and over-the-counter medicines.  Any problems you or family members have had with anesthetic medicines.  Any blood disorders you have.  Any surgeries you have had.  Any medical conditions you have.  Any antibiotic medicines you are required  to take before dental procedures. What are the risks? Generally, this is a safe procedure. However, problems can occur and include:  Bleeding from a tear in the lining of the esophagus.  A hole (perforation) in the esophagus. What happens before the procedure?  Do not eat or drink anything after midnight on the night before the procedure or as directed by your health care provider.  Ask your health care provider about changing or stopping your regular medicines. This is especially important if you are taking diabetes medicines or blood thinners.  Plan to have someone take you home after the procedure. What happens during the procedure?  You will be given a medicine that makes you  relaxed and sleepy (sedative).  A medicine may be sprayed or gargled to numb the back of the throat.  Your health care provider can use various instruments to do an esophageal dilatation. During the procedure, the instrument used will be placed in your mouth and passed down into your esophagus. Options include:  Simple dilators. This instrument is carefully placed in the esophagus to stretch it.  Guided wire bougies. In this method, a flexible tube (endoscope) is used to insert a wire into the esophagus. The dilator is passed over this wire to enlarge the esophagus. Then the wire is removed.  Balloon dilators. An endoscope with a small balloon at the end is passed down into the esophagus. Inflating the balloon gently stretches the esophagus and opens it up. What happens after the procedure?  Your blood pressure, heart rate, breathing rate, and blood oxygen level will be monitored often until the medicines you were given have worn off.  Your throat may feel slightly sore and will probably still feel numb. This will improve slowly over time.  You will not be allowed to eat or drink until the throat numbness has resolved.  If this is a same-day procedure, you may be allowed to go home once you have been able to drink, urinate, and sit on the edge of the bed without nausea or dizziness.  If this is a same-day procedure, you should have a friend or family member with you for the next 24 hours after the procedure. This information is not intended to replace advice given to you by your health care provider. Make sure you discuss any questions you have with your health care provider. Document Released: 12/28/2005 Document Revised: 04/13/2016 Document Reviewed: 03/18/2014 Elsevier Interactive Patient Education  2017 [employer]. Colonoscopy, Adult A colonoscopy is an exam to look at the entire large intestine. During the exam, a lubricated, bendable tube is inserted into the anus and then passed  into the rectum, colon, and other parts of the large intestine. A colonoscopy is often done as a part of normal colorectal screening or in response to certain symptoms, such as anemia, persistent diarrhea, abdominal pain, and blood in the stool. The exam can help screen for and diagnose medical problems, including:  Tumors.  Polyps.  Inflammation.  Areas of bleeding. Tell a health care provider about:  Any allergies you have.  All medicines you are taking, including vitamins, herbs, eye drops, creams, and over-the-counter medicines.  Any problems you or family members have had with anesthetic medicines.  Any blood disorders you have.  Any surgeries you have had.  Any medical conditions you have.  Any problems you have had passing stool. What are the risks? Generally, this is a safe procedure. However, problems may occur, including:  Bleeding.  A tear in the  intestine.  A reaction to medicines given during the exam.  Infection (rare). What happens before the procedure? Eating and drinking restrictions  Follow instructions from your health care provider about eating and drinking, which may include:  A few days before the procedure - follow a low-fiber diet. Avoid nuts, seeds, dried fruit, raw fruits, and vegetables.  1-3 days before the procedure - follow a clear liquid diet. Drink only clear liquids, such as clear broth or bouillon, black coffee or tea, clear juice, clear soft drinks or sports drinks, gelatin desert, and popsicles. Avoid any liquids that contain red or purple dye.  On the day of the procedure - do not eat or drink anything during the 2 hours before the procedure, or within the time period that your health care provider recommends. Bowel prep  If you were prescribed an oral bowel prep to clean out your colon:  Take it as told by your health care provider. Starting the day before your procedure, you will need to drink a large amount of medicated liquid.  The liquid will cause you to have multiple loose stools until your stool is almost clear or light green.  If your skin or anus gets irritated from diarrhea, you may use these to relieve the irritation:  Medicated wipes, such as adult wet wipes with aloe and vitamin E.  A skin soothing-product like petroleum jelly.  If you vomit while drinking the bowel prep, take a break for up to 60 minutes and then begin the bowel prep again. If vomiting continues and you cannot take the bowel prep without vomiting, call your health care provider. General instructions  Ask your health care provider about changing or stopping your regular medicines. This is especially important if you are taking diabetes medicines or blood thinners.  Plan to have someone take you home from the hospital or clinic. What happens during the procedure?  An IV tube may be inserted into one of your veins.  You will be given medicine to help you relax (sedative).  To reduce your risk of infection:  Your health care team will wash or sanitize their hands.  Your anal area will be washed with soap.  You will be asked to lie on your side with your knees bent.  Your health care provider will lubricate a long, thin, flexible tube. The tube will have a camera and a light on the end.  The tube will be inserted into your anus.  The tube will be gently eased through your rectum and colon.  Air will be delivered into your colon to keep it open. You may feel some pressure or cramping.  The camera will be used to take images during the procedure.  A small tissue sample may be removed from your body to be examined under a microscope (biopsy). If any potential problems are found, the tissue will be sent to a lab for testing.  If small polyps are found, your health care provider may remove them and have them checked for cancer cells.  The tube that was inserted into your anus will be slowly removed. The procedure may vary among  health care providers and hospitals. What happens after the procedure?  Your blood pressure, heart rate, breathing rate, and blood oxygen level will be monitored until the medicines you were given have worn off.  Do not drive for 24 hours after the exam.  You may have a small amount of blood in your stool.  You may pass gas and  have mild abdominal cramping or bloating due to the air that was used to inflate your colon during the exam.  It is up to you to get the results of your procedure. Ask your health care provider, or the department performing the procedure, when your results will be ready. This information is not intended to replace advice given to you by your health care provider. Make sure you discuss any questions you have with your health care provider. Document Released: 11/03/2000 Document Revised: 05/26/2016 Document Reviewed: 01/18/2016 Elsevier Interactive Patient Education  2017 [employer].

## 2016-10-17 ENCOUNTER — Encounter (HOSPITAL_COMMUNITY)
Admission: RE | Admit: 2016-10-17 | Discharge: 2016-10-17 | Disposition: A | Discharge status: Home / Self Care | Payer: Medicare Other | Source: Ambulatory Visit | Attending: Internal Medicine | Admitting: Internal Medicine

## 2016-10-17 ENCOUNTER — Encounter (HOSPITAL_COMMUNITY): Payer: Self-pay

## 2016-10-20 ENCOUNTER — Ambulatory Visit (HOSPITAL_COMMUNITY): Admission: RE | Admit: 2016-10-20 | Payer: Medicare Other | Source: Ambulatory Visit | Admitting: Internal Medicine

## 2016-10-20 ENCOUNTER — Encounter (HOSPITAL_COMMUNITY): Admission: RE | Payer: Self-pay | Source: Ambulatory Visit

## 2016-10-20 SURGERY — COLONOSCOPY WITH PROPOFOL
Anesthesia: Monitor Anesthesia Care

## 2016-11-01 ENCOUNTER — Encounter (INDEPENDENT_AMBULATORY_CARE_PROVIDER_SITE_OTHER): Payer: Self-pay | Admitting: *Deleted

## 2016-11-01 ENCOUNTER — Other Ambulatory Visit (INDEPENDENT_AMBULATORY_CARE_PROVIDER_SITE_OTHER): Payer: Self-pay | Admitting: *Deleted

## 2016-11-01 DIAGNOSIS — R1319 Other dysphagia: Secondary | ICD-10-CM | POA: Insufficient documentation

## 2016-11-01 DIAGNOSIS — Z1211 Encounter for screening for malignant neoplasm of colon: Secondary | ICD-10-CM

## 2016-11-24 NOTE — Patient Instructions (Signed)
Bonnie Weeks  11/24/2016     @PREFPERIOPPHARMACY @   Your procedure is scheduled on  12/01/2016   Report to [facility] at  615  A.M.  Call this number if you have problems the morning of surgery:  [phone]   Remember:  Do not eat food or drink liquids after midnight.  Take these medicines the morning of surgery with A SIP OF WATER  Armidex, zyrtec, flexaril, neurontin, hydrocodone, prevacid, zegerid, zoloft, singulair.   Do not wear jewelry, make-up or nail polish.  Do not wear lotions, powders, or perfumes, or deoderant.  Do not shave 48 hours prior to surgery.  Men may shave face and neck.  Do not bring valuables to the hospital.  [facility] is not responsible for any belongings or valuables.  Contacts, dentures or bridgework may not be worn into surgery.  Leave your suitcase in the car.  After surgery it may be brought to your room.  For patients admitted to the hospital, discharge time will be determined by your treatment team.  Patients discharged the day of surgery will not be allowed to drive home.   Name and phone number of your driver:   family Special instructions:  Follow the diet and prep instructions given to you by Dr Olevia Perches office.  Please read over the following fact sheets that you were given. Anesthesia Post-op Instructions and Care and Recovery After Surgery       Esophagogastroduodenoscopy Introduction Esophagogastroduodenoscopy (EGD) is a procedure to examine the lining of the esophagus, stomach, and first part of the small intestine (duodenum). This procedure is done to check for problems such as inflammation, bleeding, ulcers, or growths. During this procedure, a long, flexible, lighted tube with a camera attached (endoscope) is inserted down the throat. Tell a health care provider about:  Any allergies you have.  All medicines you are taking, including vitamins, herbs, eye drops, creams, and over-the-counter  medicines.  Any problems you or family members have had with anesthetic medicines.  Any blood disorders you have.  Any surgeries you have had.  Any medical conditions you have.  Whether you are pregnant or may be pregnant. What are the risks? Generally, this is a safe procedure. However, problems may occur, including:  Infection.  Bleeding.  A tear (perforation) in the esophagus, stomach, or duodenum.  Trouble breathing.  Excessive sweating.  Spasms of the larynx.  A slowed heartbeat.  Low blood pressure. What happens before the procedure?  Follow instructions from your health care provider about eating or drinking restrictions.  Ask your health care provider about:  Changing or stopping your regular medicines. This is especially important if you are taking diabetes medicines or blood thinners.  Taking medicines such as aspirin and ibuprofen. These medicines can thin your blood. Do not take these medicines before your procedure if your health care provider instructs you not to.  Plan to have someone take you home after the procedure.  If you wear dentures, be ready to remove them before the procedure. What happens during the procedure?  To reduce your risk of infection, your health care team will wash or sanitize their hands.  An IV tube will be put in a vein in your hand or arm. You will get medicines and fluids through this tube.  You will be given one or more of the following:  A medicine to help you relax (sedative).  A medicine to numb the area (local anesthetic). This medicine may be sprayed into your throat. It will make you feel more comfortable and keep you from gagging or coughing during the procedure.  A medicine for pain.  A mouth guard may be placed in your mouth to protect your teeth and to keep you from biting on the endoscope.  You will be asked to lie on your left side.  The endoscope will be lowered down your throat into your esophagus,  stomach, and duodenum.  Air will be put into the endoscope. This will help your health care provider see better.  The lining of your esophagus, stomach, and duodenum will be examined.  Your health care provider may:  Take a tissue sample so it can be looked at in a lab (biopsy).  Remove growths.  Remove objects (foreign bodies) that are stuck.  Treat any bleeding with medicines or other devices that stop tissue from bleeding.  Widen (dilate) or stretch narrowed areas of your esophagus and stomach.  The endoscope will be taken out. The procedure may vary among health care providers and hospitals. What happens after the procedure?  Your blood pressure, heart rate, breathing rate, and blood oxygen level will be monitored often until the medicines you were given have worn off.  Do not eat or drink anything until the numbing medicine has worn off and your gag reflex has returned. This information is not intended to replace advice given to you by your health care provider. Make sure you discuss any questions you have with your health care provider. Document Released: 03/09/2005 Document Revised: 04/13/2016 Document Reviewed: 09/30/2015  2017 Elsevier Esophagogastroduodenoscopy, Care After Introduction Refer to this sheet in the next few weeks. These instructions provide you with information about caring for yourself after your procedure. Your health care provider may also give you more specific instructions. Your treatment has been planned according to current medical practices, but problems sometimes occur. Call your health care provider if you have any problems or questions after your procedure. What can I expect after the procedure? After the procedure, it is common to have:  A sore throat.  Nausea.  Bloating.  Dizziness.  Fatigue. Follow these instructions at home:  Do not eat or drink anything until the numbing medicine (local anesthetic) has worn off and your gag reflex  has returned. You will know that the local anesthetic has worn off when you can swallow comfortably.  Do not drive for 24 hours if you received a medicine to help you relax (sedative).  If your health care provider took a tissue sample for testing during the procedure, make sure to get your test results. This is your responsibility. Ask your health care provider or the department performing the test when your results will be ready.  Keep all follow-up visits as told by your health care provider. This is important. Contact a health care provider if:  You cannot stop coughing.  You are not urinating.  You are urinating less than usual. Get help right away if:  You have trouble swallowing.  You cannot eat or drink.  You have throat or chest pain that gets worse.  You are dizzy or light-headed.  You faint.  You have nausea or vomiting.  You have chills.  You have a fever.  You have severe abdominal pain.  You have black, tarry, or bloody stools. This information is not intended to replace advice given to you by your health care provider. Make sure you  discuss any questions you have with your health care provider. Document Released: 10/23/2012 Document Revised: 04/13/2016 Document Reviewed: 09/30/2015  2017 Elsevier  Esophageal Dilatation Esophageal dilatation is a procedure to open a blocked or narrowed part of the esophagus. The esophagus is the long tube in your throat that carries food and liquid from your mouth to your stomach. The procedure is also called esophageal dilation. You may need this procedure if you have a buildup of scar tissue in your esophagus that makes it difficult, painful, or even impossible to swallow. This can be caused by gastroesophageal reflux disease (GERD). In rare cases, people need this procedure because they have cancer of the esophagus or a problem with the way food moves through the esophagus. Sometimes you may need to have another dilatation  to enlarge the opening of the esophagus gradually. Tell a health care provider about:  Any allergies you have.  All medicines you are taking, including vitamins, herbs, eye drops, creams, and over-the-counter medicines.  Any problems you or family members have had with anesthetic medicines.  Any blood disorders you have.  Any surgeries you have had.  Any medical conditions you have.  Any antibiotic medicines you are required to take before dental procedures. What are the risks? Generally, this is a safe procedure. However, problems can occur and include:  Bleeding from a tear in the lining of the esophagus.  A hole (perforation) in the esophagus. What happens before the procedure?  Do not eat or drink anything after midnight on the night before the procedure or as directed by your health care provider.  Ask your health care provider about changing or stopping your regular medicines. This is especially important if you are taking diabetes medicines or blood thinners.  Plan to have someone take you home after the procedure. What happens during the procedure?  You will be given a medicine that makes you relaxed and sleepy (sedative).  A medicine may be sprayed or gargled to numb the back of the throat.  Your health care provider can use various instruments to do an esophageal dilatation. During the procedure, the instrument used will be placed in your mouth and passed down into your esophagus. Options include:  Simple dilators. This instrument is carefully placed in the esophagus to stretch it.  Guided wire bougies. In this method, a flexible tube (endoscope) is used to insert a wire into the esophagus. The dilator is passed over this wire to enlarge the esophagus. Then the wire is removed.  Balloon dilators. An endoscope with a small balloon at the end is passed down into the esophagus. Inflating the balloon gently stretches the esophagus and opens it up. What happens after  the procedure?  Your blood pressure, heart rate, breathing rate, and blood oxygen level will be monitored often until the medicines you were given have worn off.  Your throat may feel slightly sore and will probably still feel numb. This will improve slowly over time.  You will not be allowed to eat or drink until the throat numbness has resolved.  If this is a same-day procedure, you may be allowed to go home once you have been able to drink, urinate, and sit on the edge of the bed without nausea or dizziness.  If this is a same-day procedure, you should have a friend or family member with you for the next 24 hours after the procedure. This information is not intended to replace advice given to you by your health care provider. Make  sure you discuss any questions you have with your health care provider. Document Released: 12/28/2005 Document Revised: 04/13/2016 Document Reviewed: 03/18/2014 Elsevier Interactive Patient Education  2017 [place].  Colonoscopy, Adult A colonoscopy is an exam to look at the entire large intestine. During the exam, a lubricated, bendable tube is inserted into the anus and then passed into the rectum, colon, and other parts of the large intestine. A colonoscopy is often done as a part of normal colorectal screening or in response to certain symptoms, such as anemia, persistent diarrhea, abdominal pain, and blood in the stool. The exam can help screen for and diagnose medical problems, including:  Tumors.  Polyps.  Inflammation.  Areas of bleeding. Tell a health care provider about:  Any allergies you have.  All medicines you are taking, including vitamins, herbs, eye drops, creams, and over-the-counter medicines.  Any problems you or family members have had with anesthetic medicines.  Any blood disorders you have.  Any surgeries you have had.  Any medical conditions you have.  Any problems you have had passing stool. What are the  risks? Generally, this is a safe procedure. However, problems may occur, including:  Bleeding.  A tear in the intestine.  A reaction to medicines given during the exam.  Infection (rare). What happens before the procedure? Eating and drinking restrictions  Follow instructions from your health care provider about eating and drinking, which may include:  A few days before the procedure - follow a low-fiber diet. Avoid nuts, seeds, dried fruit, raw fruits, and vegetables.  1-3 days before the procedure - follow a clear liquid diet. Drink only clear liquids, such as clear broth or bouillon, black coffee or tea, clear juice, clear soft drinks or sports drinks, gelatin desert, and popsicles. Avoid any liquids that contain red or purple dye.  On the day of the procedure - do not eat or drink anything during the 2 hours before the procedure, or within the time period that your health care provider recommends. Bowel prep  If you were prescribed an oral bowel prep to clean out your colon:  Take it as told by your health care provider. Starting the day before your procedure, you will need to drink a large amount of medicated liquid. The liquid will cause you to have multiple loose stools until your stool is almost clear or light green.  If your skin or anus gets irritated from diarrhea, you may use these to relieve the irritation:  Medicated wipes, such as adult wet wipes with aloe and vitamin E.  A skin soothing-product like petroleum jelly.  If you vomit while drinking the bowel prep, take a break for up to 60 minutes and then begin the bowel prep again. If vomiting continues and you cannot take the bowel prep without vomiting, call your health care provider. General instructions  Ask your health care provider about changing or stopping your regular medicines. This is especially important if you are taking diabetes medicines or blood thinners.  Plan to have someone take you home from the  hospital or clinic. What happens during the procedure?  An IV tube may be inserted into one of your veins.  You will be given medicine to help you relax (sedative).  To reduce your risk of infection:  Your health care team will wash or sanitize their hands.  Your anal area will be washed with soap.  You will be asked to lie on your side with your knees bent.  Your health  care provider will lubricate a long, thin, flexible tube. The tube will have a camera and a light on the end.  The tube will be inserted into your anus.  The tube will be gently eased through your rectum and colon.  Air will be delivered into your colon to keep it open. You may feel some pressure or cramping.  The camera will be used to take images during the procedure.  A small tissue sample may be removed from your body to be examined under a microscope (biopsy). If any potential problems are found, the tissue will be sent to a lab for testing.  If small polyps are found, your health care provider may remove them and have them checked for cancer cells.  The tube that was inserted into your anus will be slowly removed. The procedure may vary among health care providers and hospitals. What happens after the procedure?  Your blood pressure, heart rate, breathing rate, and blood oxygen level will be monitored until the medicines you were given have worn off.  Do not drive for 24 hours after the exam.  You may have a small amount of blood in your stool.  You may pass gas and have mild abdominal cramping or bloating due to the air that was used to inflate your colon during the exam.  It is up to you to get the results of your procedure. Ask your health care provider, or the department performing the procedure, when your results will be ready. This information is not intended to replace advice given to you by your health care provider. Make sure you discuss any questions you have with your health care  provider. Document Released: 11/03/2000 Document Revised: 05/26/2016 Document Reviewed: 01/18/2016 Elsevier Interactive Patient Education  2017 [employer]  Colonoscopy, Adult, Care After This sheet gives you information about how to care for yourself after your procedure. Your health care provider may also give you more specific instructions. If you have problems or questions, contact your health care provider. What can I expect after the procedure? After the procedure, it is common to have:  A small amount of blood in your stool for 24 hours after the procedure.  Some gas.  Mild abdominal cramping or bloating. Follow these instructions at home: General instructions  For the first 24 hours after the procedure:  Do not drive or use machinery.  Do not sign important documents.  Do not drink alcohol.  Do your regular daily activities at a slower pace than normal.  Eat soft, easy-to-digest foods.  Rest often.  Take over-the-counter or prescription medicines only as told by your health care provider.  It is up to you to get the results of your procedure. Ask your health care provider, or the department performing the procedure, when your results will be ready. Relieving cramping and bloating  Try walking around when you have cramps or feel bloated.  Apply heat to your abdomen as told by your health care provider. Use a heat source that your health care provider recommends, such as a moist heat pack or a heating pad.  Place a towel between your skin and the heat source.  Leave the heat on for 20-30 minutes.  Remove the heat if your skin turns bright red. This is especially important if you are unable to feel pain, heat, or cold. You may have a greater risk of getting burned. Eating and drinking  Drink enough fluid to keep your urine clear or pale yellow.  Resume your  normal diet as instructed by your health care provider. Avoid heavy or fried foods that are hard to  digest.  Avoid drinking alcohol for as long as instructed by your health care provider. Contact a health care provider if:  You have blood in your stool 2-3 days after the procedure. Get help right away if:  You have more than a small spotting of blood in your stool.  You pass large blood clots in your stool.  Your abdomen is swollen.  You have nausea or vomiting.  You have a fever.  You have increasing abdominal pain that is not relieved with medicine. This information is not intended to replace advice given to you by your health care provider. Make sure you discuss any questions you have with your health care provider. Document Released: 06/20/2004 Document Revised: 07/31/2016 Document Reviewed: 01/18/2016 Elsevier Interactive Patient Education  2017 [place] Anesthesia is a term that refers to techniques, procedures, and medicines that help a person stay safe and comfortable during a medical procedure. Monitored anesthesia care, or sedation, is one type of anesthesia. Your anesthesia specialist may recommend sedation if you will be having a procedure that does not require you to be unconscious, such as:  Cataract surgery.  A dental procedure.  A biopsy.  A colonoscopy. During the procedure, you may receive a medicine to help you relax (sedative). There are three levels of sedation:  Mild sedation. At this level, you may feel awake and relaxed. You will be able to follow directions.  Moderate sedation. At this level, you will be sleepy. You may not remember the procedure.  Deep sedation. At this level, you will be asleep. You will not remember the procedure. The more medicine you are given, the deeper your level of sedation will be. Depending on how you respond to the procedure, the anesthesia specialist may change your level of sedation or the type of anesthesia to fit your needs. An anesthesia specialist will monitor you closely during the  procedure. Let your health care provider know about:  Any allergies you have.  All medicines you are taking, including vitamins, herbs, eye drops, creams, and over-the-counter medicines.  Any use of steroids (by mouth or as a cream).  Any problems you or family members have had with sedatives and anesthetic medicines.  Any blood disorders you have.  Any surgeries you have had.  Any medical conditions you have, such as sleep apnea.  Whether you are pregnant or may be pregnant.  Any use of cigarettes, alcohol, or street drugs. What are the risks? Generally, this is a safe procedure. However, problems may occur, including:  Getting too much medicine (oversedation).  Nausea.  Allergic reaction to medicines.  Trouble breathing. If this happens, a breathing tube may be used to help with breathing. It will be removed when you are awake and breathing on your own.  Heart trouble.  Lung trouble. Before the procedure Staying hydrated  Follow instructions from your health care provider about hydration, which may include:  Up to 2 hours before the procedure - you may continue to drink clear liquids, such as water, clear fruit juice, black coffee, and plain tea. Eating and drinking restrictions  Follow instructions from your health care provider about eating and drinking, which may include:  8 hours before the procedure - stop eating heavy meals or foods such as meat, fried foods, or fatty foods.  6 hours before the procedure - stop eating light meals or foods,  such as toast or cereal.  6 hours before the procedure - stop drinking milk or drinks that contain milk.  2 hours before the procedure - stop drinking clear liquids. Medicines  Ask your health care provider about:  Changing or stopping your regular medicines. This is especially important if you are taking diabetes medicines or blood thinners.  Taking medicines such as aspirin and ibuprofen. These medicines can thin your  blood. Do not take these medicines before your procedure if your health care provider instructs you not to. Tests and exams  You will have a physical exam.  You may have blood tests done to show:  How well your kidneys and liver are working.  How well your blood can clot.  General instructions  Plan to have someone take you home from the hospital or clinic.  If you will be going home right after the procedure, plan to have someone with you for 24 hours. What happens during the procedure?  Your blood pressure, heart rate, breathing, level of pain and overall condition will be monitored.  An IV tube will be inserted into one of your veins.  Your anesthesia specialist will give you medicines as needed to keep you comfortable during the procedure. This may mean changing the level of sedation.  The procedure will be performed. After the procedure  Your blood pressure, heart rate, breathing rate, and blood oxygen level will be monitored until the medicines you were given have worn off.  Do not drive for 24 hours if you received a sedative.  You may:  Feel sleepy, clumsy, or nauseous.  Feel forgetful about what happened after the procedure.  Have a sore throat if you had a breathing tube during the procedure.  Vomit. This information is not intended to replace advice given to you by your health care provider. Make sure you discuss any questions you have with your health care provider. Document Released: 08/02/2005 Document Revised: 04/14/2016 Document Reviewed: 02/27/2016 Elsevier Interactive Patient Education  2017 [place], Care After These instructions provide you with information about caring for yourself after your procedure. Your health care provider may also give you more specific instructions. Your treatment has been planned according to current medical practices, but problems sometimes occur. Call your health care provider if you have any  problems or questions after your procedure. What can I expect after the procedure? After your procedure, it is common to:  Feel sleepy for several hours.  Feel clumsy and have poor balance for several hours.  Feel forgetful about what happened after the procedure.  Have poor judgment for several hours.  Feel nauseous or vomit.  Have a sore throat if you had a breathing tube during the procedure. Follow these instructions at home: For at least 24 hours after the procedure:   Do not:  Participate in activities in which you could fall or become injured.  Drive.  Use heavy machinery.  Drink alcohol.  Take sleeping pills or medicines that cause drowsiness.  Make important decisions or sign legal documents.  Take care of children on your own.  Rest. Eating and drinking  Follow the diet that is recommended by your health care provider.  If you vomit, drink water, juice, or soup when you can drink without vomiting.  Make sure you have little or no nausea before eating solid foods. General instructions  Have a responsible adult stay with you until you are awake and alert.  Take over-the-counter and prescription medicines only  as told by your health care provider.  If you smoke, do not smoke without supervision.  Keep all follow-up visits as told by your health care provider. This is important. Contact a health care provider if:  You keep feeling nauseous or you keep vomiting.  You feel light-headed.  You develop a rash.  You have a fever. Get help right away if:  You have trouble breathing. This information is not intended to replace advice given to you by your health care provider. Make sure you discuss any questions you have with your health care provider. Document Released: 02/27/2016 Document Revised: 06/28/2016 Document Reviewed: 02/27/2016 Elsevier Interactive Patient Education  2017 [employer].

## 2016-11-28 ENCOUNTER — Encounter (HOSPITAL_COMMUNITY): Payer: Self-pay

## 2016-11-28 ENCOUNTER — Encounter (HOSPITAL_COMMUNITY)
Admission: RE | Admit: 2016-11-28 | Discharge: 2016-11-28 | Disposition: A | Discharge status: Home / Self Care | Payer: Medicare Other | Source: Ambulatory Visit | Attending: Internal Medicine | Admitting: Internal Medicine

## 2016-11-28 ENCOUNTER — Other Ambulatory Visit: Payer: Self-pay

## 2016-11-28 DIAGNOSIS — J45909 Unspecified asthma, uncomplicated: Secondary | ICD-10-CM | POA: Diagnosis not present

## 2016-11-28 DIAGNOSIS — K31819 Angiodysplasia of stomach and duodenum without bleeding: Secondary | ICD-10-CM | POA: Diagnosis not present

## 2016-11-28 DIAGNOSIS — R9431 Abnormal electrocardiogram [ECG] [EKG]: Secondary | ICD-10-CM

## 2016-11-28 DIAGNOSIS — D120 Benign neoplasm of cecum: Secondary | ICD-10-CM | POA: Diagnosis not present

## 2016-11-28 DIAGNOSIS — F329 Major depressive disorder, single episode, unspecified: Secondary | ICD-10-CM | POA: Diagnosis not present

## 2016-11-28 DIAGNOSIS — D649 Anemia, unspecified: Secondary | ICD-10-CM | POA: Diagnosis not present

## 2016-11-28 DIAGNOSIS — Z01818 Encounter for other preprocedural examination: Secondary | ICD-10-CM | POA: Insufficient documentation

## 2016-11-28 DIAGNOSIS — Z01812 Encounter for preprocedural laboratory examination: Secondary | ICD-10-CM

## 2016-11-28 DIAGNOSIS — I10 Essential (primary) hypertension: Secondary | ICD-10-CM | POA: Diagnosis not present

## 2016-11-28 DIAGNOSIS — K644 Residual hemorrhoidal skin tags: Secondary | ICD-10-CM | POA: Diagnosis not present

## 2016-11-28 DIAGNOSIS — Z1211 Encounter for screening for malignant neoplasm of colon: Secondary | ICD-10-CM | POA: Diagnosis not present

## 2016-11-28 DIAGNOSIS — Z888 Allergy status to other drugs, medicaments and biological substances status: Secondary | ICD-10-CM | POA: Diagnosis not present

## 2016-11-28 DIAGNOSIS — R1314 Dysphagia, pharyngoesophageal phase: Secondary | ICD-10-CM | POA: Diagnosis not present

## 2016-11-28 DIAGNOSIS — K219 Gastro-esophageal reflux disease without esophagitis: Secondary | ICD-10-CM | POA: Diagnosis not present

## 2016-11-28 DIAGNOSIS — K5730 Diverticulosis of large intestine without perforation or abscess without bleeding: Secondary | ICD-10-CM | POA: Diagnosis not present

## 2016-11-28 DIAGNOSIS — Z853 Personal history of malignant neoplasm of breast: Secondary | ICD-10-CM | POA: Diagnosis not present

## 2016-11-28 DIAGNOSIS — R1319 Other dysphagia: Secondary | ICD-10-CM

## 2016-11-28 DIAGNOSIS — Z885 Allergy status to narcotic agent status: Secondary | ICD-10-CM | POA: Diagnosis not present

## 2016-11-28 DIAGNOSIS — Z981 Arthrodesis status: Secondary | ICD-10-CM | POA: Diagnosis not present

## 2016-11-28 DIAGNOSIS — K449 Diaphragmatic hernia without obstruction or gangrene: Secondary | ICD-10-CM | POA: Diagnosis not present

## 2016-11-28 DIAGNOSIS — E785 Hyperlipidemia, unspecified: Secondary | ICD-10-CM | POA: Diagnosis not present

## 2016-11-28 DIAGNOSIS — Z9889 Other specified postprocedural states: Secondary | ICD-10-CM | POA: Diagnosis not present

## 2016-11-28 HISTORY — DX: Anemia, unspecified: D64.9

## 2016-11-28 HISTORY — DX: Gastro-esophageal reflux disease without esophagitis: K21.9

## 2016-11-28 LAB — BASIC METABOLIC PANEL
Anion gap: 9 (ref 5–15)
BUN: 11 mg/dL (ref 6–20)
CHLORIDE: 100 mmol/L — AB (ref 101–111)
CO2: 27 mmol/L (ref 22–32)
Calcium: 9.6 mg/dL (ref 8.9–10.3)
Creatinine, Ser: 0.68 mg/dL (ref 0.44–1.00)
GFR calc Af Amer: >60 mL/min (ref >60)
Glucose, Bld: 106 mg/dL — ABNORMAL HIGH (ref 65–99)
POTASSIUM: 3.6 mmol/L (ref 3.5–5.1)
SODIUM: 136 mmol/L (ref 135–145)

## 2016-11-28 LAB — CBC WITH DIFFERENTIAL/PLATELET
BASOS ABS: 0.1 10*3/uL (ref 0.0–0.1)
Basophils Relative: 1 %
EOS ABS: 0.3 10*3/uL (ref 0.0–0.7)
EOS PCT: 4 %
HCT: 37.2 % (ref 36.0–46.0)
HEMOGLOBIN: 12.1 g/dL (ref 12.0–15.0)
LYMPHS PCT: 26 %
Lymphs Abs: 1.9 10*3/uL (ref 0.7–4.0)
MCH: 28.3 pg (ref 26.0–34.0)
MCHC: 32.5 g/dL (ref 30.0–36.0)
MCV: 86.9 fL (ref 78.0–100.0)
Monocytes Absolute: 0.6 10*3/uL (ref 0.1–1.0)
Monocytes Relative: 8 %
Neutro Abs: 4.5 10*3/uL (ref 1.7–7.7)
Neutrophils Relative %: 61 %
PLATELETS: 279 10*3/uL (ref 150–400)
RBC: 4.28 MIL/uL (ref 3.87–5.11)
RDW: 17.2 % — ABNORMAL HIGH (ref 11.5–15.5)
WBC: 7.3 10*3/uL (ref 4.0–10.5)

## 2016-11-28 LAB — SURGICAL PCR SCREEN
MRSA, PCR: NEGATIVE
Staphylococcus aureus: NEGATIVE

## 2016-12-01 ENCOUNTER — Ambulatory Visit (HOSPITAL_COMMUNITY): Payer: Medicare Other | Admitting: Anesthesiology

## 2016-12-01 ENCOUNTER — Encounter (HOSPITAL_COMMUNITY)
Admission: RE | Disposition: A | Discharge status: Home / Self Care | Payer: Self-pay | Source: Ambulatory Visit | Attending: Internal Medicine

## 2016-12-01 ENCOUNTER — Encounter (HOSPITAL_COMMUNITY): Payer: Self-pay | Admitting: Anesthesiology

## 2016-12-01 ENCOUNTER — Ambulatory Visit (HOSPITAL_COMMUNITY)
Admission: RE | Admit: 2016-12-01 | Discharge: 2016-12-01 | Disposition: A | Discharge status: Home / Self Care | Payer: Medicare Other | Source: Ambulatory Visit | Attending: Internal Medicine | Admitting: Internal Medicine

## 2016-12-01 DIAGNOSIS — I10 Essential (primary) hypertension: Secondary | ICD-10-CM | POA: Insufficient documentation

## 2016-12-01 DIAGNOSIS — Z9889 Other specified postprocedural states: Secondary | ICD-10-CM | POA: Diagnosis not present

## 2016-12-01 DIAGNOSIS — K449 Diaphragmatic hernia without obstruction or gangrene: Secondary | ICD-10-CM | POA: Insufficient documentation

## 2016-12-01 DIAGNOSIS — K644 Residual hemorrhoidal skin tags: Secondary | ICD-10-CM | POA: Diagnosis not present

## 2016-12-01 DIAGNOSIS — Z981 Arthrodesis status: Secondary | ICD-10-CM | POA: Insufficient documentation

## 2016-12-01 DIAGNOSIS — Z885 Allergy status to narcotic agent status: Secondary | ICD-10-CM | POA: Insufficient documentation

## 2016-12-01 DIAGNOSIS — K31819 Angiodysplasia of stomach and duodenum without bleeding: Secondary | ICD-10-CM

## 2016-12-01 DIAGNOSIS — Z1211 Encounter for screening for malignant neoplasm of colon: Secondary | ICD-10-CM | POA: Insufficient documentation

## 2016-12-01 DIAGNOSIS — F329 Major depressive disorder, single episode, unspecified: Secondary | ICD-10-CM | POA: Insufficient documentation

## 2016-12-01 DIAGNOSIS — R933 Abnormal findings on diagnostic imaging of other parts of digestive tract: Secondary | ICD-10-CM | POA: Diagnosis not present

## 2016-12-01 DIAGNOSIS — D649 Anemia, unspecified: Secondary | ICD-10-CM | POA: Insufficient documentation

## 2016-12-01 DIAGNOSIS — R1314 Dysphagia, pharyngoesophageal phase: Secondary | ICD-10-CM

## 2016-12-01 DIAGNOSIS — E785 Hyperlipidemia, unspecified: Secondary | ICD-10-CM | POA: Insufficient documentation

## 2016-12-01 DIAGNOSIS — Z853 Personal history of malignant neoplasm of breast: Secondary | ICD-10-CM | POA: Insufficient documentation

## 2016-12-01 DIAGNOSIS — K5730 Diverticulosis of large intestine without perforation or abscess without bleeding: Secondary | ICD-10-CM | POA: Diagnosis not present

## 2016-12-01 DIAGNOSIS — D120 Benign neoplasm of cecum: Secondary | ICD-10-CM | POA: Insufficient documentation

## 2016-12-01 DIAGNOSIS — R1319 Other dysphagia: Secondary | ICD-10-CM

## 2016-12-01 DIAGNOSIS — K219 Gastro-esophageal reflux disease without esophagitis: Secondary | ICD-10-CM | POA: Insufficient documentation

## 2016-12-01 DIAGNOSIS — K228 Other specified diseases of esophagus: Secondary | ICD-10-CM | POA: Diagnosis not present

## 2016-12-01 DIAGNOSIS — J45909 Unspecified asthma, uncomplicated: Secondary | ICD-10-CM | POA: Insufficient documentation

## 2016-12-01 DIAGNOSIS — Z888 Allergy status to other drugs, medicaments and biological substances status: Secondary | ICD-10-CM | POA: Insufficient documentation

## 2016-12-01 HISTORY — PX: POLYPECTOMY: SHX5525

## 2016-12-01 HISTORY — PX: ESOPHAGEAL DILATION: SHX303

## 2016-12-01 HISTORY — PX: ESOPHAGOGASTRODUODENOSCOPY (EGD) WITH PROPOFOL: SHX5813

## 2016-12-01 HISTORY — PX: COLONOSCOPY WITH PROPOFOL: SHX5780

## 2016-12-01 SURGERY — COLONOSCOPY WITH PROPOFOL
Anesthesia: Monitor Anesthesia Care

## 2016-12-01 MED ORDER — FENTANYL CITRATE (PF) 100 MCG/2ML IJ SOLN
25.0000 ug | INTRAMUSCULAR | Status: DC | PRN
  Administered 2016-12-01: 25 ug via INTRAVENOUS

## 2016-12-01 MED ORDER — MIDAZOLAM HCL 2 MG/2ML IJ SOLN
0.5000 mg | INTRAMUSCULAR | Status: DC | PRN
  Administered 2016-12-01: 2 mg via INTRAVENOUS

## 2016-12-01 MED ORDER — PROPOFOL 10 MG/ML IV BOLUS
INTRAVENOUS | Status: DC | PRN
  Administered 2016-12-01: 75 mg via INTRAVENOUS
  Administered 2016-12-01: 10 mg via INTRAVENOUS

## 2016-12-01 MED ORDER — PROPOFOL 500 MG/50ML IV EMUL
INTRAVENOUS | Status: DC | PRN
  Administered 2016-12-01: 50 ug/kg/min via INTRAVENOUS
  Administered 2016-12-01: 125 ug/kg/min via INTRAVENOUS

## 2016-12-01 MED ORDER — BUTAMBEN-TETRACAINE-BENZOCAINE 2-2-14 % EX AERO
INHALATION_SPRAY | CUTANEOUS | Status: AC
  Filled 2016-12-01: qty 20

## 2016-12-01 MED ORDER — BUTAMBEN-TETRACAINE-BENZOCAINE 2-2-14 % EX AERO
1.0000 | INHALATION_SPRAY | Freq: Once | CUTANEOUS | Status: AC
Start: 2016-12-01 — End: 2016-12-01
  Administered 2016-12-01: 1 via TOPICAL

## 2016-12-01 MED ORDER — FENTANYL CITRATE (PF) 100 MCG/2ML IJ SOLN
INTRAMUSCULAR | Status: AC
  Filled 2016-12-01: qty 2

## 2016-12-01 MED ORDER — PROPOFOL 10 MG/ML IV BOLUS
INTRAVENOUS | Status: AC
  Filled 2016-12-01: qty 40

## 2016-12-01 MED ORDER — LACTATED RINGERS IV SOLN
INTRAVENOUS | Status: DC
  Administered 2016-12-01: 07:00:00 via INTRAVENOUS

## 2016-12-01 MED ORDER — MIDAZOLAM HCL 2 MG/2ML IJ SOLN
INTRAMUSCULAR | Status: AC
  Filled 2016-12-01: qty 2

## 2016-12-01 MED ORDER — LIDOCAINE HCL (PF) 1 % IJ SOLN
INTRAMUSCULAR | Status: AC
  Filled 2016-12-01: qty 5

## 2016-12-01 NOTE — Op Note (Signed)
[facility] Patient Name: Bonnie Weeks Procedure Date: 12/01/2016 7:57 AM MRN: [id number] Date of Birth: 10/13/1946 Attending MD: Hildred Laser , MD CSN: [id number] Age: 71 Admit Type: Outpatient Procedure:                Colonoscopy Indications:              Screening for colorectal malignant neoplasm Providers:                Hildred Laser, MD, Lurline Del, RN, Isabella Stalling,                            Technician Referring MD:             Tommie Sams, MD Medicines:                Propofol per Anesthesia Complications:            No immediate complications. Estimated Blood Loss:     Estimated blood loss was minimal. Procedure:                Pre-Anesthesia Assessment:                           - Prior to the procedure, a History and Physical                            was performed, and patient medications and                            allergies were reviewed. The patient's tolerance of                            previous anesthesia was also reviewed. The risks                            and benefits of the procedure and the sedation                            options and risks were discussed with the patient.                            All questions were answered, and informed consent                            was obtained. Prior Anticoagulants: The patient has                            taken no previous anticoagulant or antiplatelet                            agents. ASA Grade Assessment: II - A patient with                            mild systemic disease. After reviewing the risks  and benefits, the patient was deemed in                            satisfactory condition to undergo the procedure.                           After obtaining informed consent, the colonoscope                            was passed under direct vision. Throughout the                            procedure, the patient's blood pressure, pulse, and     oxygen saturations were monitored continuously. The                            [id number]) was introduced through the                            anus and advanced to the the cecum, identified by                            appendiceal orifice and ileocecal valve. The                            colonoscopy was technically difficult and complex                            due to significant looping and a tortuous colon.                            Successful completion of the procedure was aided by                            changing the patient to a supine position, using                            manual pressure and applying abdominal pressure.                            The patient tolerated the procedure well. The                            quality of the bowel preparation was adequate to                            identify polyps 6 mm and larger in size. The                            ileocecal valve, appendiceal orifice, and rectum                            were photographed. Scope In: 8:05:10 AM Scope Out: 8:36:35 AM Scope Withdrawal  Time: 0 hours 7 minutes 51 seconds  Total Procedure Duration: 0 hours 31 minutes 25 seconds  Findings:      The perianal and digital rectal examinations were normal.      A small polyp was found in the cecum. The polyp was sessile. Biopsies       were taken with a cold forceps for histology.      Scattered medium-mouthed diverticula were found in the sigmoid colon and       descending colon.      External hemorrhoids were found during retroflexion. The hemorrhoids       were small. Impression:               - One small polyp in the cecum. Biopsied.                           - Diverticulosis in the sigmoid colon and in the                            descending colon.                           - External hemorrhoids. Moderate Sedation:      Per Anesthesia Care Recommendation:           - Patient has a contact number available for                             emergencies. The signs and symptoms of potential                            delayed complications were discussed with the                            patient. Return to normal activities tomorrow.                            Written discharge instructions were provided to the                            patient.                           - High fiber diet today.                           - Continue present medications.                           - Await pathology results.                           - Repeat colonoscopy date to be determined after                            pending pathology results are reviewed. Procedure Code(s):        --- Professional ---  45380, Colonoscopy, flexible; with biopsy, single                            or multiple Diagnosis Code(s):        --- Professional ---                           Z12.11, Encounter for screening for malignant                            neoplasm of colon                           D12.0, Benign neoplasm of cecum                           K64.4, Residual hemorrhoidal skin tags                           K57.30, Diverticulosis of large intestine without                            perforation or abscess without bleeding CPT copyright 2016 American [employer] Association. All rights reserved. The codes documented in this report are preliminary and upon coder review may  be revised to meet current compliance requirements. Hildred Laser, MD Hildred Laser, MD 12/01/2016 8:52:00 AM This report has been signed electronically. Number of Addenda: 0

## 2016-12-01 NOTE — Transfer of Care (Signed)
Immediate Anesthesia Transfer of Care Note  Patient: Bonnie Weeks  Procedure(s) Performed: Procedure(s) with comments: COLONOSCOPY WITH PROPOFOL (N/A) - 7:30 ESOPHAGOGASTRODUODENOSCOPY (EGD) WITH PROPOFOL (N/A) ESOPHAGEAL DILATION (N/A) POLYPECTOMY - cecal polypectomy via cold biopsy  Patient Location: PACU  Anesthesia Type:MAC  Level of Consciousness: awake, alert  and oriented  Airway & Oxygen Therapy: Patient Spontanous Breathing and Patient connected to nasal cannula oxygen  Post-op Assessment: Report given to RN  Post vital signs: Reviewed and stable  Last Vitals:  Vitals:   12/01/16 0725 12/01/16 0730  BP:  (!) 141/70  Resp: 14 (!) 38  Temp:      Last Pain: There were no vitals filed for this visit.    Patients Stated Pain Goal: 5 (46/96/29 5284)  Complications: No apparent anesthesia complications

## 2016-12-01 NOTE — Discharge Instructions (Signed)
Resume usual medications including ferrous sulfate as before. High-fiber diet. No driving for 24 hours. Physician will call with biopsy results.   Colonoscopy, Adult, Care After This sheet gives you information about how to care for yourself after your procedure. Your doctor may also give you more specific instructions. If you have problems or questions, call your doctor. Follow these instructions at home: General instructions  For the first 24 hours after the procedure:  Do not drive or use machinery.  Do not sign important documents.  Do not drink alcohol.  Do your daily activities more slowly than normal.  Eat foods that are soft and easy to digest.  Rest often.  Take over-the-counter or prescription medicines only as told by your doctor.  It is up to you to get the results of your procedure. Ask your doctor, or the department performing the procedure, when your results will be ready. To help cramping and bloating:  Try walking around.  Put heat on your belly (abdomen) as told by your doctor. Use a heat source that your doctor recommends, such as a moist heat pack or a heating pad.  Put a towel between your skin and the heat source.  Leave the heat on for 20-30 minutes.  Remove the heat if your skin turns bright red. This is especially important if you cannot feel pain, heat, or cold. You can get burned. Eating and drinking  Drink enough fluid to keep your pee (urine) clear or pale yellow.  Return to your normal diet as told by your doctor. Avoid heavy or fried foods that are hard to digest.  Avoid drinking alcohol for as long as told by your doctor. Contact a doctor if:  You have blood in your poop (stool) 2-3 days after the procedure. Get help right away if:  You have more than a small amount of blood in your poop.  You see large clumps of tissue (blood clots) in your poop.  Your belly is swollen.  You feel sick to your stomach (nauseous).  You throw up  (vomit).  You have a fever.  You have belly pain that gets worse, and medicine does not help your pain. This information is not intended to replace advice given to you by your health care provider. Make sure you discuss any questions you have with your health care provider. Document Released: 12/09/2010 Document Revised: 07/31/2016 Document Reviewed: 07/31/2016 Elsevier Interactive Patient Education  2017 [place].  Esophagogastroduodenoscopy, Care After Introduction Refer to this sheet in the next few weeks. These instructions provide you with information about caring for yourself after your procedure. Your health care provider may also give you more specific instructions. Your treatment has been planned according to current medical practices, but problems sometimes occur. Call your health care provider if you have any problems or questions after your procedure. What can I expect after the procedure? After the procedure, it is common to have:  A sore throat.  Nausea.  Bloating.  Dizziness.  Fatigue. Follow these instructions at home:  Do not eat or drink anything until the numbing medicine (local anesthetic) has worn off and your gag reflex has returned. You will know that the local anesthetic has worn off when you can swallow comfortably.  Do not drive for 24 hours if you received a medicine to help you relax (sedative).  If your health care provider took a tissue sample for testing during the procedure, make sure to get your test results. This is your responsibility. Ask  your health care provider or the department performing the test when your results will be ready.  Keep all follow-up visits as told by your health care provider. This is important. Contact a health care provider if:  You cannot stop coughing.  You are not urinating.  You are urinating less than usual. Get help right away if:  You have trouble swallowing.  You cannot eat or drink.  You have throat  or chest pain that gets worse.  You are dizzy or light-headed.  You faint.  You have nausea or vomiting.  You have chills.  You have a fever.  You have severe abdominal pain.  You have black, tarry, or bloody stools. This information is not intended to replace advice given to you by your health care provider. Make sure you discuss any questions you have with your health care provider. Document Released: 10/23/2012 Document Revised: 04/13/2016 Document Reviewed: 09/30/2015  2017 Elsevier   Esophageal Dilatation Esophageal dilatation is a procedure to open a blocked or narrowed part of the esophagus. The esophagus is the long tube in your throat that carries food and liquid from your mouth to your stomach. The procedure is also called esophageal dilation. You may need this procedure if you have a buildup of scar tissue in your esophagus that makes it difficult, painful, or even impossible to swallow. This can be caused by gastroesophageal reflux disease (GERD). In rare cases, people need this procedure because they have cancer of the esophagus or a problem with the way food moves through the esophagus. Sometimes you may need to have another dilatation to enlarge the opening of the esophagus gradually. Tell a health care provider about:  Any allergies you have.  All medicines you are taking, including vitamins, herbs, eye drops, creams, and over-the-counter medicines.  Any problems you or family members have had with anesthetic medicines.  Any blood disorders you have.  Any surgeries you have had.  Any medical conditions you have.  Any antibiotic medicines you are required to take before dental procedures. What are the risks? Generally, this is a safe procedure. However, problems can occur and include:  Bleeding from a tear in the lining of the esophagus.  A hole (perforation) in the esophagus. What happens before the procedure?  Do not eat or drink anything after midnight  on the night before the procedure or as directed by your health care provider.  Ask your health care provider about changing or stopping your regular medicines. This is especially important if you are taking diabetes medicines or blood thinners.  Plan to have someone take you home after the procedure. What happens during the procedure?  You will be given a medicine that makes you relaxed and sleepy (sedative).  A medicine may be sprayed or gargled to numb the back of the throat.  Your health care provider can use various instruments to do an esophageal dilatation. During the procedure, the instrument used will be placed in your mouth and passed down into your esophagus. Options include:  Simple dilators. This instrument is carefully placed in the esophagus to stretch it.  Guided wire bougies. In this method, a flexible tube (endoscope) is used to insert a wire into the esophagus. The dilator is passed over this wire to enlarge the esophagus. Then the wire is removed.  Balloon dilators. An endoscope with a small balloon at the end is passed down into the esophagus. Inflating the balloon gently stretches the esophagus and opens it up. What happens after  the procedure?  Your blood pressure, heart rate, breathing rate, and blood oxygen level will be monitored often until the medicines you were given have worn off.  Your throat may feel slightly sore and will probably still feel numb. This will improve slowly over time.  You will not be allowed to eat or drink until the throat numbness has resolved.  If this is a same-day procedure, you may be allowed to go home once you have been able to drink, urinate, and sit on the edge of the bed without nausea or dizziness.  If this is a same-day procedure, you should have a friend or family member with you for the next 24 hours after the procedure. This information is not intended to replace advice given to you by your health care provider. Make sure  you discuss any questions you have with your health care provider. Document Released: 12/28/2005 Document Revised: 04/13/2016 Document Reviewed: 03/18/2014 Elsevier Interactive Patient Education  2017 [place].    High-Fiber Diet Fiber, also called dietary fiber, is a type of carbohydrate found in fruits, vegetables, whole grains, and beans. A high-fiber diet can have many health benefits. Your health care provider may recommend a high-fiber diet to help:  Prevent constipation. Fiber can make your bowel movements more regular.  Lower your cholesterol.  Relieve hemorrhoids, uncomplicated diverticulosis, or irritable bowel syndrome.  Prevent overeating as part of a weight-loss plan.  Prevent heart disease, type 2 diabetes, and certain cancers. What is my plan? The recommended daily intake of fiber includes:  38 grams for men under age 52.  24 grams for men over age 6.  2 grams for women under age 52.  32 grams for women over age 61. You can get the recommended daily intake of dietary fiber by eating a variety of fruits, vegetables, grains, and beans. Your health care provider may also recommend a fiber supplement if it is not possible to get enough fiber through your diet. What do I need to know about a high-fiber diet?  Fiber supplements have not been widely studied for their effectiveness, so it is better to get fiber through food sources.  Always check the fiber content on thenutrition facts label of any prepackaged food. Look for foods that contain at least 5 grams of fiber per serving.  Ask your dietitian if you have questions about specific foods that are related to your condition, especially if those foods are not listed in the following section.  Increase your daily fiber consumption gradually. Increasing your intake of dietary fiber too quickly may cause bloating, cramping, or gas.  Drink plenty of water. Water helps you to digest fiber. What foods can I  eat? Grains  Whole-grain breads. Multigrain cereal. Oats and oatmeal. Brown rice. Barley. Bulgur wheat. [place]. Bran muffins. Popcorn. Rye wafer crackers. Vegetables  Sweet potatoes. Spinach. Kale. Artichokes. Cabbage. Broccoli. Green peas. Carrots. Squash. Fruits  Berries. Pears. Apples. Oranges. Avocados. Prunes and raisins. Dried figs. Meats and Other Protein Sources  [place], kidney, pinto, and soy beans. Split peas. Lentils. Nuts and seeds. Dairy  Fiber-fortified yogurt. Beverages  Fiber-fortified soy milk. Fiber-fortified orange juice. Other  Fiber bars. The items listed above may not be a complete list of recommended foods or beverages. Contact your dietitian for more options.  What foods are not recommended? Grains  White bread. Pasta made with refined flour. White rice. Vegetables  Fried potatoes. Canned vegetables. Well-cooked vegetables. Fruits  Fruit juice. Cooked, strained fruit. Meats and Other Protein Sources  Fatty cuts of meat. Fried Sales executive or fried fish. Dairy  Milk. Yogurt. Cream cheese. Sour cream. Beverages  Soft drinks. Other  Cakes and pastries. Butter and oils. The items listed above may not be a complete list of foods and beverages to avoid. Contact your dietitian for more information.  What are some tips for including high-fiber foods in my diet?  Eat a wide variety of high-fiber foods.  Make sure that half of all grains consumed each day are whole grains.  Replace breads and cereals made from refined flour or white flour with whole-grain breads and cereals.  Replace white rice with brown rice, bulgur wheat, or millet.  Start the day with a breakfast that is high in fiber, such as a cereal that contains at least 5 grams of fiber per serving.  Use beans in place of meat in soups, salads, or pasta.  Eat high-fiber snacks, such as berries, raw vegetables, nuts, or popcorn. This information is not intended to replace advice given to you by your  health care provider. Make sure you discuss any questions you have with your health care provider. Document Released: 11/06/2005 Document Revised: 04/13/2016 Document Reviewed: 04/21/2014 Elsevier Interactive Patient Education  2017 [employer].

## 2016-12-01 NOTE — Anesthesia Preprocedure Evaluation (Signed)
Anesthesia Evaluation  Patient identified by MRN, date of birth, ID band Patient awake    Reviewed: Allergy & Precautions, NPO status , Patient's Chart, lab work & pertinent test results  Airway Mallampati: II  TM Distance: >3 FB Neck ROM: Full    Dental  (+) Teeth Intact, Dental Advisory Given   Pulmonary asthma ,    breath sounds clear to auscultation       Cardiovascular hypertension,  Rhythm:Regular Rate:Normal     Neuro/Psych  Headaches, PSYCHIATRIC DISORDERS Depression    GI/Hepatic hiatal hernia, GERD  ,  Endo/Other    Renal/GU      Musculoskeletal   Abdominal   Peds  Hematology  (+) anemia ,   Anesthesia Other Findings   Reproductive/Obstetrics                             Anesthesia Physical Anesthesia Plan  ASA: II  Anesthesia Plan: MAC   Post-op Pain Management:    Induction: Intravenous  Airway Management Planned: Simple Face Mask  Additional Equipment:   Intra-op Plan:   Post-operative Plan:   Informed Consent: I have reviewed the patients History and Physical, chart, labs and discussed the procedure including the risks, benefits and alternatives for the proposed anesthesia with the patient or authorized representative who has indicated his/her understanding and acceptance.     Plan Discussed with:   Anesthesia Plan Comments:         Anesthesia Quick Evaluation

## 2016-12-01 NOTE — H&P (Signed)
Bonnie Weeks is an 71 y.o. female.   Chief Complaint: Patient is here for EGD, ED and colonoscopy. HPI: Patient is 71 year old Caucasian female who was chronic GERD and history esophageal stricture who presents with three-month history of dysphagia to solids. She says her esophagus has been dilated a few times but not recently. Barium study revealed stricture at GE junction presenting +13 mm barium pill. She says heartburn is well controlled with therapy. She denies abdominal pain change in bowel habits or rectal bleeding. Last colonoscopy was over 10 years ago. Patient has lost 17 pounds over the last few months voluntarily. She is hoping to lose a few more pounds. Personal history significant for breast carcinoma and she remains in remission. Family history is negative for CRC.  Past Medical History:  Diagnosis Date  . Anemia   . Asthma   . Back pain   . Cancer [facility])    right breast cancer  . Depression   . GERD (gastroesophageal reflux disease)   . Headache   . History of hiatal hernia   . Hyperlipidemia   . Hypertension   . Leaky heart valve     Past Surgical History:  Procedure Laterality Date  . ANTERIOR CERVICAL DECOMP/DISCECTOMY FUSION  11/2014   "[place]"  . ANTERIOR LAT LUMBAR FUSION Left 11/09/2015   Procedure: Lumbar two-three,lumbar three-four.lumbar four-five Anterior Lateral Lumbar Fusion;  Surgeon: Erline Levine, MD;  Location: [facility];  Service: Neurosurgery;  Laterality: Left;  . BACK SURGERY    . BREAST LUMPECTOMY Right   . BUNIONECTOMY Bilateral   . COLONOSCOPY    . HERNIA REPAIR     hiatal hernia repair x3  . Hiatal hernia repair    . POSTERIOR LUMBAR FUSION  11/09/2015   L5-S1 with Pedicle Screws L2-LUMBAR SACRAL ONE/NOTES 11/09/2015  . TONSILLECTOMY      No family history on file. Social History:  reports that she has never smoked. She has never used smokeless tobacco. She reports that she does not drink alcohol or use drugs.  Allergies:   Allergies  Allergen Reactions  . Hydromorphone Anaphylaxis  . Procaine Other (See Comments)    Headache   . Oxycodone Nausea And Vomiting    Medications Prior to Admission  Medication Sig Dispense Refill  . anastrozole (ARIMIDEX) 1 MG tablet Take 1 mg by mouth daily.    . cetirizine (ZYRTEC) 10 MG tablet Take 10 mg by mouth daily.    . clarithromycin (BIAXIN) 500 MG tablet Take 500 mg by mouth 2 (two) times daily. 10 day therapy course patient to complete on 11/25/15  0  . Diphenhydramine-APAP 12.5-325 MG TABS Take 1-2 tablets by mouth 2 (two) times daily as needed. For pain.    Marland Kitchen EPINEPHrine (EPIPEN 2-PAK) 0.3 mg/0.3 mL IJ SOAJ injection Inject 0.3 mg into the muscle daily as needed (for allergic reaction).    . ferrous sulfate 325 (65 FE) MG tablet Take 325 mg by mouth daily.  11  . gabapentin (NEURONTIN) 300 MG capsule Take 300-600 mg by mouth 2 (two) times daily. 300 mg in the morning and 600 mg in the evening    . hydrochlorothiazide (HYDRODIURIL) 25 MG tablet Take 25 mg by mouth daily.    Marland Kitchen HYDROcodone-acetaminophen (NORCO) 10-325 MG tablet Take 1 tablet by mouth every 6 (six) hours as needed for moderate pain.    Marland Kitchen lansoprazole (PREVACID) 30 MG capsule Take 30 mg by mouth daily.     Marland Kitchen losartan-hydrochlorothiazide (HYZAAR) 50-12.5  MG tablet Take 1 tablet by mouth daily.    . montelukast (SINGULAIR) 10 MG tablet Take 10 mg by mouth daily.     Marland Kitchen omeprazole-sodium bicarbonate (ZEGERID) 40-1100 MG capsule Take 1 capsule by mouth 2 (two) times daily as needed (for acid reflux.).    Marland Kitchen sertraline (ZOLOFT) 100 MG tablet Take 150 mg by mouth daily.    . simvastatin (ZOCOR) 20 MG tablet Take 20 mg by mouth at bedtime.     . SUMAtriptan 6 MG/0.5ML SOAJ Inject 6 mg into the skin daily as needed. For migraine    . vitamin C (ASCORBIC ACID) 500 MG tablet Take 500 mg by mouth daily.    . cyclobenzaprine (FLEXERIL) 10 MG tablet Take 10 mg by mouth 3 (three) times daily as needed for muscle spasms.       No results found for this or any previous visit (from the past 48 hour(s)). No results found.  ROS  Blood pressure 135/68, temperature 97.7 F (36.5 C), resp. rate 20, SpO2 96 %. Physical Exam  Constitutional: She appears well-developed and well-nourished.  HENT:  Mouth/Throat: Oropharynx is clear and moist.  Eyes: Conjunctivae are normal. No scleral icterus.  Neck: No thyromegaly present.  Cardiovascular: Normal rate, regular rhythm and normal heart sounds.   No murmur heard. Respiratory: Effort normal and breath sounds normal.  GI:  Abdomen is symmetrical soft and nontender without organomegaly or masses.  Musculoskeletal: She exhibits no edema.  Lymphadenopathy:    She has no cervical adenopathy.  Neurological: She is alert.  Skin: Skin is warm and dry.     Assessment/Plan Solid food dysphagia secondary to distal esophageal stricture. Chronic GERD. EGD with ED and average risk screening colonoscopy.  Hildred Laser, MD 12/01/2016, 7:23 AM

## 2016-12-01 NOTE — Op Note (Signed)
[facility] Patient Name: Bonnie Weeks Procedure Date: 12/01/2016 7:37 AM MRN: [id number] Date of Birth: [birth year] Attending MD: Hildred Laser , MD CSN: [id number] Age: 71 Admit Type: Outpatient Procedure:                Upper GI endoscopy Indications:              Esophageal dysphagia, Abnormal UGI series Providers:                Hildred Laser, MD, Lurline Del, RN, Isabella Stalling,                            Technician Referring MD:             Tommie Sams, MD Medicines:                Cetacaine spray, Propofol per Anesthesia Complications:            No immediate complications. Estimated Blood Loss:     Estimated blood loss: none. Procedure:                Pre-Anesthesia Assessment:                           - Prior to the procedure, a History and Physical                            was performed, and patient medications and                            allergies were reviewed. The patient's tolerance of                            previous anesthesia was also reviewed. The risks                            and benefits of the procedure and the sedation                            options and risks were discussed with the patient.                            All questions were answered, and informed consent                            was obtained. Prior Anticoagulants: The patient has                            taken no previous anticoagulant or antiplatelet                            agents. ASA Grade Assessment: II - A patient with                            mild systemic disease. After reviewing the risks  and benefits, the patient was deemed in                            satisfactory condition to undergo the procedure.                           After obtaining informed consent, the endoscope was                            passed under direct vision. Throughout the                            procedure, the patient's blood pressure, pulse, and                   oxygen saturations were monitored continuously. The                            [id number]) was introduced through the and                            advanced to the second part of duodenum. The upper                            GI endoscopy was accomplished without difficulty.                            The patient tolerated the procedure well. Scope In: 7:45:49 AM Scope Out: [id number] AM Total Procedure Duration: 0 hours 11 minutes 29 seconds  Findings:      The examined esophagus was normal.      The Z-line was irregular and was found 39 cm from the incisors.      No endoscopic abnormality was evident in the esophagus to explain the       patient's complaint of dysphagia. It was decided, however, to proceed       with dilation at the gastroesophageal junction. A TTS dilator was passed       through the scope. Dilation with a 15-16.5-18 mm balloon dilator was       performed to 15 mm, 16.5 mm and 18 mm. The dilation site was examined       and showed no change.      Evidence of a [id number] degree fundoplication was found in the cardia. The       wrap appeared intact. This was traversed.      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      The exam of the stomach was otherwise normal.      The duodenal bulb and second portion of the duodenum were normal. Impression:               - Normal esophagus.                           - Z-line irregular without stricture at 39 cm from  the incisors.                           - No endoscopic esophageal abnormality to explain                            patient's dysphagia. Esophagus dilated. Dilated.                           - A [id number] degree fundoplication was found. The wrap                            appears intact.                           - Gastric antral vascular ectasia.                           - Normal duodenal bulb and second portion of the                             duodenum.                           - No specimens collected. Moderate Sedation:      Per Anesthesia Care Recommendation:           - Patient has a contact number available for                            emergencies. The signs and symptoms of potential                            delayed complications were discussed with the                            patient. Return to normal activities tomorrow.                            Written discharge instructions were provided to the                            patient.                           - Resume previous diet today.                           - Continue present medications.                           - Repeat upper endoscopy PRN for retreatment. Procedure Code(s):        --- Professional ---                           [id number], Esophagogastroduodenoscopy, flexible,  transoral; with transendoscopic balloon dilation of                            esophagus (less than 30 mm diameter) Diagnosis Code(s):        --- Professional ---                           K22.8, Other specified diseases of esophagus                           Z98.890, Other specified postprocedural states                           K31.819, Angiodysplasia of stomach and duodenum                            without bleeding                           R13.14, Dysphagia, pharyngoesophageal phase                           R93.3, Abnormal findings on diagnostic imaging of                            other parts of digestive tract CPT copyright 2016 American Medical Association. All rights reserved. The codes documented in this report are preliminary and upon coder review may  be revised to meet current compliance requirements. Hildred Laser, MD Hildred Laser, MD 12/01/2016 8:48:13 AM This report has been signed electronically. Number of Addenda: 0

## 2016-12-01 NOTE — Anesthesia Postprocedure Evaluation (Signed)
Anesthesia Post Note  Patient: SOKHA CRAKER  Procedure(s) Performed: Procedure(s) (LRB): COLONOSCOPY WITH PROPOFOL (N/A) ESOPHAGOGASTRODUODENOSCOPY (EGD) WITH PROPOFOL (N/A) ESOPHAGEAL DILATION (N/A) POLYPECTOMY  Patient location during evaluation: PACU Anesthesia Type: MAC Level of consciousness: awake and alert and oriented Pain management: pain level controlled Vital Signs Assessment: post-procedure vital signs reviewed and stable Respiratory status: spontaneous breathing Cardiovascular status: blood pressure returned to baseline Postop Assessment: no signs of nausea or vomiting Anesthetic complications: no     Last Vitals:  Vitals:   12/01/16 0725 12/01/16 0730  BP:  (!) 141/70  Resp: 14 (!) 38  Temp:      Last Pain: There were no vitals filed for this visit.               Facundo Allemand

## 2016-12-04 ENCOUNTER — Encounter (HOSPITAL_COMMUNITY): Payer: Self-pay | Admitting: Internal Medicine

## 2017-09-17 ENCOUNTER — Telehealth (INDEPENDENT_AMBULATORY_CARE_PROVIDER_SITE_OTHER): Payer: Self-pay | Admitting: Internal Medicine

## 2017-09-17 NOTE — Telephone Encounter (Signed)
Patient called and stated that her esophagus is having spasms and feels like it is closing up.  She had another doctors visit recently and was told there was medication Dr. Laural Golden could give her for this.  She would like to see if there is something he can give her to help.  [phone] cell  [phone] home

## 2017-09-24 ENCOUNTER — Other Ambulatory Visit (INDEPENDENT_AMBULATORY_CARE_PROVIDER_SITE_OTHER): Payer: Self-pay | Admitting: Internal Medicine

## 2017-09-24 MED ORDER — DILTIAZEM HCL 30 MG PO TABS: 30.0000 mg | ORAL_TABLET | Freq: Three times a day (TID) | ORAL | 0 refills | Status: AC

## 2017-09-24 NOTE — Telephone Encounter (Signed)
Patient called again today to check on this.

## 2017-09-24 NOTE — Telephone Encounter (Signed)
Discussed and forwarded to [place] . He has called the patient and discussed with her.

## 2017-09-28 NOTE — Telephone Encounter (Signed)
Prescription for diltiazem sent to patient's pharmacy on 09/24/2017

## 2017-10-31 ENCOUNTER — Telehealth (INDEPENDENT_AMBULATORY_CARE_PROVIDER_SITE_OTHER): Payer: Self-pay | Admitting: *Deleted

## 2017-10-31 NOTE — Telephone Encounter (Signed)
Patient was called and she was not available. I talked with her husband , Abe People.  We ask that the patient only take 10 mg of the Simvastatin daily. The reason is that the Diltiazem HCL and the Simvastatin she is taking both. Use of the Diltiazem with the Simvastatin (doses of Simvastatin exceeding 10 mg a day)may increase simvastatin plasma levels and the risk for myopathy or rhabdomyolysis.  Patient's husband states that he will give the patient the message.

## 2019-06-05 DIAGNOSIS — J4520 Mild intermittent asthma, uncomplicated: Secondary | ICD-10-CM | POA: Diagnosis not present

## 2019-06-05 DIAGNOSIS — J329 Chronic sinusitis, unspecified: Secondary | ICD-10-CM | POA: Diagnosis not present

## 2019-06-25 DIAGNOSIS — M8448XA Pathological fracture, other site, initial encounter for fracture: Secondary | ICD-10-CM | POA: Diagnosis not present

## 2019-06-25 DIAGNOSIS — M545 Low back pain: Secondary | ICD-10-CM | POA: Diagnosis not present

## 2019-06-25 DIAGNOSIS — M5416 Radiculopathy, lumbar region: Secondary | ICD-10-CM | POA: Diagnosis not present

## 2019-06-25 DIAGNOSIS — M8448XG Pathological fracture, other site, subsequent encounter for fracture with delayed healing: Secondary | ICD-10-CM | POA: Diagnosis not present

## 2019-07-03 DIAGNOSIS — H43813 Vitreous degeneration, bilateral: Secondary | ICD-10-CM | POA: Diagnosis not present

## 2019-07-03 DIAGNOSIS — H25813 Combined forms of age-related cataract, bilateral: Secondary | ICD-10-CM | POA: Diagnosis not present

## 2019-07-08 DIAGNOSIS — C50919 Malignant neoplasm of unspecified site of unspecified female breast: Secondary | ICD-10-CM | POA: Diagnosis not present

## 2019-07-08 DIAGNOSIS — M8589 Other specified disorders of bone density and structure, multiple sites: Secondary | ICD-10-CM | POA: Diagnosis not present

## 2019-07-08 DIAGNOSIS — Z79811 Long term (current) use of aromatase inhibitors: Secondary | ICD-10-CM | POA: Diagnosis not present

## 2019-07-10 DIAGNOSIS — M1712 Unilateral primary osteoarthritis, left knee: Secondary | ICD-10-CM | POA: Diagnosis not present

## 2019-07-10 DIAGNOSIS — I10 Essential (primary) hypertension: Secondary | ICD-10-CM | POA: Diagnosis not present

## 2019-07-10 DIAGNOSIS — Z6835 Body mass index (BMI) 35.0-35.9, adult: Secondary | ICD-10-CM | POA: Diagnosis not present

## 2019-07-10 DIAGNOSIS — R300 Dysuria: Secondary | ICD-10-CM | POA: Diagnosis not present

## 2019-07-10 DIAGNOSIS — M1711 Unilateral primary osteoarthritis, right knee: Secondary | ICD-10-CM | POA: Diagnosis not present

## 2019-07-15 DIAGNOSIS — M8589 Other specified disorders of bone density and structure, multiple sites: Secondary | ICD-10-CM | POA: Diagnosis not present

## 2019-07-15 DIAGNOSIS — C50919 Malignant neoplasm of unspecified site of unspecified female breast: Secondary | ICD-10-CM | POA: Diagnosis not present

## 2019-07-15 DIAGNOSIS — Z79811 Long term (current) use of aromatase inhibitors: Secondary | ICD-10-CM | POA: Diagnosis not present

## 2019-08-20 DIAGNOSIS — F339 Major depressive disorder, recurrent, unspecified: Secondary | ICD-10-CM | POA: Diagnosis not present

## 2019-08-20 DIAGNOSIS — E782 Mixed hyperlipidemia: Secondary | ICD-10-CM | POA: Diagnosis not present

## 2019-08-20 DIAGNOSIS — I10 Essential (primary) hypertension: Secondary | ICD-10-CM | POA: Diagnosis not present

## 2019-08-20 DIAGNOSIS — K5790 Diverticulosis of intestine, part unspecified, without perforation or abscess without bleeding: Secondary | ICD-10-CM | POA: Diagnosis not present

## 2019-08-20 DIAGNOSIS — M8580 Other specified disorders of bone density and structure, unspecified site: Secondary | ICD-10-CM | POA: Diagnosis not present

## 2019-08-25 DIAGNOSIS — M4310 Spondylolisthesis, site unspecified: Secondary | ICD-10-CM | POA: Diagnosis not present

## 2019-08-25 DIAGNOSIS — Z6835 Body mass index (BMI) 35.0-35.9, adult: Secondary | ICD-10-CM | POA: Diagnosis not present

## 2019-08-25 DIAGNOSIS — Z23 Encounter for immunization: Secondary | ICD-10-CM | POA: Diagnosis not present

## 2019-08-25 DIAGNOSIS — Z853 Personal history of malignant neoplasm of breast: Secondary | ICD-10-CM | POA: Diagnosis not present

## 2019-08-25 DIAGNOSIS — J410 Simple chronic bronchitis: Secondary | ICD-10-CM | POA: Diagnosis not present

## 2019-08-25 DIAGNOSIS — I10 Essential (primary) hypertension: Secondary | ICD-10-CM | POA: Diagnosis not present

## 2019-08-25 DIAGNOSIS — K5790 Diverticulosis of intestine, part unspecified, without perforation or abscess without bleeding: Secondary | ICD-10-CM | POA: Diagnosis not present

## 2019-08-25 DIAGNOSIS — Z0001 Encounter for general adult medical examination with abnormal findings: Secondary | ICD-10-CM | POA: Diagnosis not present

## 2019-09-01 DIAGNOSIS — N8111 Cystocele, midline: Secondary | ICD-10-CM | POA: Diagnosis not present

## 2019-09-25 DIAGNOSIS — N8111 Cystocele, midline: Secondary | ICD-10-CM | POA: Diagnosis not present

## 2019-09-25 DIAGNOSIS — R3129 Other microscopic hematuria: Secondary | ICD-10-CM | POA: Diagnosis not present

## 2019-09-25 DIAGNOSIS — R3915 Urgency of urination: Secondary | ICD-10-CM | POA: Diagnosis not present

## 2019-09-25 DIAGNOSIS — N3941 Urge incontinence: Secondary | ICD-10-CM | POA: Diagnosis not present

## 2019-09-25 DIAGNOSIS — R351 Nocturia: Secondary | ICD-10-CM | POA: Diagnosis not present

## 2019-09-26 DIAGNOSIS — Z6835 Body mass index (BMI) 35.0-35.9, adult: Secondary | ICD-10-CM | POA: Diagnosis not present

## 2019-09-26 DIAGNOSIS — I10 Essential (primary) hypertension: Secondary | ICD-10-CM | POA: Diagnosis not present

## 2019-09-26 DIAGNOSIS — M1712 Unilateral primary osteoarthritis, left knee: Secondary | ICD-10-CM | POA: Diagnosis not present

## 2019-09-26 DIAGNOSIS — M1711 Unilateral primary osteoarthritis, right knee: Secondary | ICD-10-CM | POA: Diagnosis not present

## 2019-11-06 DIAGNOSIS — N3941 Urge incontinence: Secondary | ICD-10-CM | POA: Diagnosis not present

## 2019-11-07 DIAGNOSIS — N3941 Urge incontinence: Secondary | ICD-10-CM | POA: Diagnosis not present

## 2019-11-11 DIAGNOSIS — N3941 Urge incontinence: Secondary | ICD-10-CM | POA: Diagnosis not present

## 2019-11-11 DIAGNOSIS — I10 Essential (primary) hypertension: Secondary | ICD-10-CM | POA: Diagnosis not present

## 2019-11-20 DIAGNOSIS — E782 Mixed hyperlipidemia: Secondary | ICD-10-CM | POA: Diagnosis not present

## 2019-11-20 DIAGNOSIS — I10 Essential (primary) hypertension: Secondary | ICD-10-CM | POA: Diagnosis not present

## 2019-11-27 DIAGNOSIS — N8111 Cystocele, midline: Secondary | ICD-10-CM | POA: Diagnosis not present

## 2019-11-27 DIAGNOSIS — R102 Pelvic and perineal pain: Secondary | ICD-10-CM | POA: Diagnosis not present

## 2019-11-27 DIAGNOSIS — R3915 Urgency of urination: Secondary | ICD-10-CM | POA: Diagnosis not present

## 2019-11-27 DIAGNOSIS — N3941 Urge incontinence: Secondary | ICD-10-CM | POA: Diagnosis not present

## 2019-11-28 DIAGNOSIS — Z79811 Long term (current) use of aromatase inhibitors: Secondary | ICD-10-CM | POA: Diagnosis not present

## 2019-11-28 DIAGNOSIS — C50919 Malignant neoplasm of unspecified site of unspecified female breast: Secondary | ICD-10-CM | POA: Diagnosis not present

## 2019-12-02 DIAGNOSIS — J069 Acute upper respiratory infection, unspecified: Secondary | ICD-10-CM | POA: Diagnosis not present

## 2019-12-02 DIAGNOSIS — J40 Bronchitis, not specified as acute or chronic: Secondary | ICD-10-CM | POA: Diagnosis not present

## 2019-12-02 DIAGNOSIS — U071 COVID-19: Secondary | ICD-10-CM | POA: Diagnosis not present

## 2019-12-03 DIAGNOSIS — M8589 Other specified disorders of bone density and structure, multiple sites: Secondary | ICD-10-CM | POA: Diagnosis not present

## 2019-12-19 DIAGNOSIS — E7849 Other hyperlipidemia: Secondary | ICD-10-CM | POA: Diagnosis not present

## 2019-12-19 DIAGNOSIS — I10 Essential (primary) hypertension: Secondary | ICD-10-CM | POA: Diagnosis not present

## 2020-01-15 DIAGNOSIS — M8580 Other specified disorders of bone density and structure, unspecified site: Secondary | ICD-10-CM | POA: Diagnosis not present

## 2020-01-19 DIAGNOSIS — N8111 Cystocele, midline: Secondary | ICD-10-CM | POA: Diagnosis not present

## 2020-01-19 DIAGNOSIS — Z882 Allergy status to sulfonamides status: Secondary | ICD-10-CM | POA: Diagnosis not present

## 2020-01-19 DIAGNOSIS — K219 Gastro-esophageal reflux disease without esophagitis: Secondary | ICD-10-CM | POA: Diagnosis not present

## 2020-01-19 DIAGNOSIS — E7800 Pure hypercholesterolemia, unspecified: Secondary | ICD-10-CM | POA: Diagnosis not present

## 2020-01-19 DIAGNOSIS — Z853 Personal history of malignant neoplasm of breast: Secondary | ICD-10-CM | POA: Diagnosis not present

## 2020-01-19 DIAGNOSIS — N3941 Urge incontinence: Secondary | ICD-10-CM | POA: Diagnosis not present

## 2020-01-19 DIAGNOSIS — Z79899 Other long term (current) drug therapy: Secondary | ICD-10-CM | POA: Diagnosis not present

## 2020-01-19 DIAGNOSIS — I10 Essential (primary) hypertension: Secondary | ICD-10-CM | POA: Diagnosis not present

## 2020-01-26 DIAGNOSIS — Z08 Encounter for follow-up examination after completed treatment for malignant neoplasm: Secondary | ICD-10-CM | POA: Diagnosis not present

## 2020-01-26 DIAGNOSIS — Z853 Personal history of malignant neoplasm of breast: Secondary | ICD-10-CM | POA: Diagnosis not present

## 2020-01-27 DIAGNOSIS — N8111 Cystocele, midline: Secondary | ICD-10-CM | POA: Diagnosis not present

## 2020-01-27 DIAGNOSIS — I10 Essential (primary) hypertension: Secondary | ICD-10-CM | POA: Diagnosis not present

## 2020-01-27 DIAGNOSIS — N3941 Urge incontinence: Secondary | ICD-10-CM | POA: Diagnosis not present

## 2020-01-27 DIAGNOSIS — Z882 Allergy status to sulfonamides status: Secondary | ICD-10-CM | POA: Diagnosis not present

## 2020-01-27 DIAGNOSIS — K219 Gastro-esophageal reflux disease without esophagitis: Secondary | ICD-10-CM | POA: Diagnosis not present

## 2020-01-27 DIAGNOSIS — Z79899 Other long term (current) drug therapy: Secondary | ICD-10-CM | POA: Diagnosis not present

## 2020-01-27 DIAGNOSIS — E7800 Pure hypercholesterolemia, unspecified: Secondary | ICD-10-CM | POA: Diagnosis not present

## 2020-01-27 DIAGNOSIS — N813 Complete uterovaginal prolapse: Secondary | ICD-10-CM | POA: Diagnosis not present

## 2020-01-27 DIAGNOSIS — N8110 Cystocele, unspecified: Secondary | ICD-10-CM | POA: Diagnosis not present

## 2020-01-27 DIAGNOSIS — Z853 Personal history of malignant neoplasm of breast: Secondary | ICD-10-CM | POA: Diagnosis not present

## 2020-01-28 DIAGNOSIS — Z882 Allergy status to sulfonamides status: Secondary | ICD-10-CM | POA: Diagnosis not present

## 2020-01-28 DIAGNOSIS — N8111 Cystocele, midline: Secondary | ICD-10-CM | POA: Diagnosis not present

## 2020-01-28 DIAGNOSIS — E7800 Pure hypercholesterolemia, unspecified: Secondary | ICD-10-CM | POA: Diagnosis not present

## 2020-01-28 DIAGNOSIS — I10 Essential (primary) hypertension: Secondary | ICD-10-CM | POA: Diagnosis not present

## 2020-01-28 DIAGNOSIS — Z853 Personal history of malignant neoplasm of breast: Secondary | ICD-10-CM | POA: Diagnosis not present

## 2020-01-28 DIAGNOSIS — N3941 Urge incontinence: Secondary | ICD-10-CM | POA: Diagnosis not present

## 2020-01-28 DIAGNOSIS — K219 Gastro-esophageal reflux disease without esophagitis: Secondary | ICD-10-CM | POA: Diagnosis not present

## 2020-01-28 DIAGNOSIS — Z79899 Other long term (current) drug therapy: Secondary | ICD-10-CM | POA: Diagnosis not present

## 2020-02-12 DIAGNOSIS — R351 Nocturia: Secondary | ICD-10-CM | POA: Diagnosis not present

## 2020-02-12 DIAGNOSIS — N8111 Cystocele, midline: Secondary | ICD-10-CM | POA: Diagnosis not present

## 2020-02-12 DIAGNOSIS — R82998 Other abnormal findings in urine: Secondary | ICD-10-CM | POA: Diagnosis not present

## 2020-02-12 DIAGNOSIS — R8281 Pyuria: Secondary | ICD-10-CM | POA: Diagnosis not present

## 2020-02-16 DIAGNOSIS — R0600 Dyspnea, unspecified: Secondary | ICD-10-CM | POA: Diagnosis not present

## 2020-02-16 DIAGNOSIS — B948 Sequelae of other specified infectious and parasitic diseases: Secondary | ICD-10-CM | POA: Diagnosis not present

## 2020-02-17 DIAGNOSIS — M8589 Other specified disorders of bone density and structure, multiple sites: Secondary | ICD-10-CM | POA: Diagnosis not present

## 2020-02-17 DIAGNOSIS — R0600 Dyspnea, unspecified: Secondary | ICD-10-CM | POA: Diagnosis not present

## 2020-02-17 DIAGNOSIS — B948 Sequelae of other specified infectious and parasitic diseases: Secondary | ICD-10-CM | POA: Diagnosis not present

## 2020-02-18 DIAGNOSIS — I10 Essential (primary) hypertension: Secondary | ICD-10-CM | POA: Diagnosis not present

## 2020-02-18 DIAGNOSIS — E7849 Other hyperlipidemia: Secondary | ICD-10-CM | POA: Diagnosis not present

## 2020-02-19 DIAGNOSIS — M25532 Pain in left wrist: Secondary | ICD-10-CM | POA: Diagnosis not present

## 2020-02-19 DIAGNOSIS — M67432 Ganglion, left wrist: Secondary | ICD-10-CM | POA: Diagnosis not present

## 2020-02-19 DIAGNOSIS — E663 Overweight: Secondary | ICD-10-CM | POA: Diagnosis not present

## 2020-02-23 DIAGNOSIS — I2699 Other pulmonary embolism without acute cor pulmonale: Secondary | ICD-10-CM | POA: Diagnosis not present

## 2020-02-23 DIAGNOSIS — M7989 Other specified soft tissue disorders: Secondary | ICD-10-CM | POA: Diagnosis not present

## 2020-02-23 DIAGNOSIS — I2694 Multiple subsegmental pulmonary emboli without acute cor pulmonale: Secondary | ICD-10-CM | POA: Diagnosis not present

## 2020-02-27 DIAGNOSIS — E782 Mixed hyperlipidemia: Secondary | ICD-10-CM | POA: Diagnosis not present

## 2020-02-27 DIAGNOSIS — I10 Essential (primary) hypertension: Secondary | ICD-10-CM | POA: Diagnosis not present

## 2020-02-27 DIAGNOSIS — E559 Vitamin D deficiency, unspecified: Secondary | ICD-10-CM | POA: Diagnosis not present

## 2020-02-27 DIAGNOSIS — Z853 Personal history of malignant neoplasm of breast: Secondary | ICD-10-CM | POA: Diagnosis not present

## 2020-03-01 DIAGNOSIS — I10 Essential (primary) hypertension: Secondary | ICD-10-CM | POA: Diagnosis not present

## 2020-03-01 DIAGNOSIS — M1712 Unilateral primary osteoarthritis, left knee: Secondary | ICD-10-CM | POA: Diagnosis not present

## 2020-03-01 DIAGNOSIS — Z6835 Body mass index (BMI) 35.0-35.9, adult: Secondary | ICD-10-CM | POA: Diagnosis not present

## 2020-03-01 DIAGNOSIS — M1711 Unilateral primary osteoarthritis, right knee: Secondary | ICD-10-CM | POA: Diagnosis not present

## 2020-03-01 DIAGNOSIS — I2699 Other pulmonary embolism without acute cor pulmonale: Secondary | ICD-10-CM | POA: Diagnosis not present

## 2020-03-01 DIAGNOSIS — E782 Mixed hyperlipidemia: Secondary | ICD-10-CM | POA: Diagnosis not present

## 2020-03-01 DIAGNOSIS — M8580 Other specified disorders of bone density and structure, unspecified site: Secondary | ICD-10-CM | POA: Diagnosis not present

## 2020-03-01 DIAGNOSIS — M4310 Spondylolisthesis, site unspecified: Secondary | ICD-10-CM | POA: Diagnosis not present

## 2020-03-04 DIAGNOSIS — M25532 Pain in left wrist: Secondary | ICD-10-CM | POA: Diagnosis not present

## 2020-03-04 DIAGNOSIS — M6740 Ganglion, unspecified site: Secondary | ICD-10-CM | POA: Diagnosis not present

## 2020-03-19 DIAGNOSIS — I10 Essential (primary) hypertension: Secondary | ICD-10-CM | POA: Diagnosis not present

## 2020-03-19 DIAGNOSIS — E7849 Other hyperlipidemia: Secondary | ICD-10-CM | POA: Diagnosis not present

## 2020-04-07 DIAGNOSIS — I2694 Multiple subsegmental pulmonary emboli without acute cor pulmonale: Secondary | ICD-10-CM | POA: Diagnosis not present

## 2020-04-07 DIAGNOSIS — J45901 Unspecified asthma with (acute) exacerbation: Secondary | ICD-10-CM | POA: Diagnosis not present

## 2020-04-14 DIAGNOSIS — M545 Low back pain: Secondary | ICD-10-CM | POA: Diagnosis not present

## 2020-04-14 DIAGNOSIS — M8448XG Pathological fracture, other site, subsequent encounter for fracture with delayed healing: Secondary | ICD-10-CM | POA: Diagnosis not present

## 2020-04-19 DIAGNOSIS — E7849 Other hyperlipidemia: Secondary | ICD-10-CM | POA: Diagnosis not present

## 2020-04-19 DIAGNOSIS — I10 Essential (primary) hypertension: Secondary | ICD-10-CM | POA: Diagnosis not present

## 2020-04-19 DIAGNOSIS — F339 Major depressive disorder, recurrent, unspecified: Secondary | ICD-10-CM | POA: Diagnosis not present

## 2020-05-10 DIAGNOSIS — N8111 Cystocele, midline: Secondary | ICD-10-CM | POA: Diagnosis not present

## 2020-05-10 DIAGNOSIS — R300 Dysuria: Secondary | ICD-10-CM | POA: Diagnosis not present

## 2020-05-10 DIAGNOSIS — R351 Nocturia: Secondary | ICD-10-CM | POA: Diagnosis not present

## 2020-05-18 DIAGNOSIS — R300 Dysuria: Secondary | ICD-10-CM | POA: Diagnosis not present

## 2020-05-19 DIAGNOSIS — I10 Essential (primary) hypertension: Secondary | ICD-10-CM | POA: Diagnosis not present

## 2020-05-19 DIAGNOSIS — E7849 Other hyperlipidemia: Secondary | ICD-10-CM | POA: Diagnosis not present

## 2020-05-19 DIAGNOSIS — F339 Major depressive disorder, recurrent, unspecified: Secondary | ICD-10-CM | POA: Diagnosis not present

## 2020-05-21 DIAGNOSIS — I2694 Multiple subsegmental pulmonary emboli without acute cor pulmonale: Secondary | ICD-10-CM | POA: Diagnosis not present

## 2020-06-08 DIAGNOSIS — R600 Localized edema: Secondary | ICD-10-CM | POA: Diagnosis not present

## 2020-06-18 DIAGNOSIS — F339 Major depressive disorder, recurrent, unspecified: Secondary | ICD-10-CM | POA: Diagnosis not present

## 2020-06-18 DIAGNOSIS — I10 Essential (primary) hypertension: Secondary | ICD-10-CM | POA: Diagnosis not present

## 2020-06-18 DIAGNOSIS — E7849 Other hyperlipidemia: Secondary | ICD-10-CM | POA: Diagnosis not present

## 2020-06-30 DIAGNOSIS — R0602 Shortness of breath: Secondary | ICD-10-CM | POA: Diagnosis not present

## 2020-06-30 DIAGNOSIS — I2694 Multiple subsegmental pulmonary emboli without acute cor pulmonale: Secondary | ICD-10-CM | POA: Diagnosis not present

## 2020-06-30 DIAGNOSIS — B948 Sequelae of other specified infectious and parasitic diseases: Secondary | ICD-10-CM | POA: Diagnosis not present

## 2020-06-30 DIAGNOSIS — J4520 Mild intermittent asthma, uncomplicated: Secondary | ICD-10-CM | POA: Diagnosis not present

## 2020-07-14 DIAGNOSIS — M8580 Other specified disorders of bone density and structure, unspecified site: Secondary | ICD-10-CM | POA: Diagnosis not present

## 2020-07-20 DIAGNOSIS — I10 Essential (primary) hypertension: Secondary | ICD-10-CM | POA: Diagnosis not present

## 2020-07-20 DIAGNOSIS — F339 Major depressive disorder, recurrent, unspecified: Secondary | ICD-10-CM | POA: Diagnosis not present

## 2020-07-20 DIAGNOSIS — E7849 Other hyperlipidemia: Secondary | ICD-10-CM | POA: Diagnosis not present

## 2020-08-04 DIAGNOSIS — Z1159 Encounter for screening for other viral diseases: Secondary | ICD-10-CM | POA: Diagnosis not present

## 2020-08-09 DIAGNOSIS — R0602 Shortness of breath: Secondary | ICD-10-CM | POA: Diagnosis not present

## 2020-08-19 DIAGNOSIS — I10 Essential (primary) hypertension: Secondary | ICD-10-CM | POA: Diagnosis not present

## 2020-08-19 DIAGNOSIS — F339 Major depressive disorder, recurrent, unspecified: Secondary | ICD-10-CM | POA: Diagnosis not present

## 2020-08-19 DIAGNOSIS — E7849 Other hyperlipidemia: Secondary | ICD-10-CM | POA: Diagnosis not present

## 2020-08-24 DIAGNOSIS — Z23 Encounter for immunization: Secondary | ICD-10-CM | POA: Diagnosis not present

## 2020-08-25 DIAGNOSIS — J329 Chronic sinusitis, unspecified: Secondary | ICD-10-CM | POA: Diagnosis not present

## 2020-08-25 DIAGNOSIS — J4520 Mild intermittent asthma, uncomplicated: Secondary | ICD-10-CM | POA: Diagnosis not present

## 2020-08-25 DIAGNOSIS — B948 Sequelae of other specified infectious and parasitic diseases: Secondary | ICD-10-CM | POA: Diagnosis not present

## 2020-08-30 DIAGNOSIS — M5431 Sciatica, right side: Secondary | ICD-10-CM | POA: Diagnosis not present

## 2020-09-02 DIAGNOSIS — M5431 Sciatica, right side: Secondary | ICD-10-CM | POA: Diagnosis not present

## 2020-09-20 DIAGNOSIS — Z1329 Encounter for screening for other suspected endocrine disorder: Secondary | ICD-10-CM | POA: Diagnosis not present

## 2020-09-20 DIAGNOSIS — I10 Essential (primary) hypertension: Secondary | ICD-10-CM | POA: Diagnosis not present

## 2020-09-20 DIAGNOSIS — E782 Mixed hyperlipidemia: Secondary | ICD-10-CM | POA: Diagnosis not present

## 2020-09-28 DIAGNOSIS — Z20822 Contact with and (suspected) exposure to covid-19: Secondary | ICD-10-CM | POA: Diagnosis not present

## 2020-09-30 DIAGNOSIS — I2699 Other pulmonary embolism without acute cor pulmonale: Secondary | ICD-10-CM | POA: Diagnosis not present

## 2020-09-30 DIAGNOSIS — R600 Localized edema: Secondary | ICD-10-CM | POA: Diagnosis not present

## 2020-09-30 DIAGNOSIS — I10 Essential (primary) hypertension: Secondary | ICD-10-CM | POA: Diagnosis not present

## 2020-09-30 DIAGNOSIS — E782 Mixed hyperlipidemia: Secondary | ICD-10-CM | POA: Diagnosis not present

## 2020-09-30 DIAGNOSIS — R9431 Abnormal electrocardiogram [ECG] [EKG]: Secondary | ICD-10-CM | POA: Diagnosis not present

## 2020-10-01 DIAGNOSIS — M5431 Sciatica, right side: Secondary | ICD-10-CM | POA: Diagnosis not present

## 2020-10-01 DIAGNOSIS — I10 Essential (primary) hypertension: Secondary | ICD-10-CM | POA: Diagnosis not present

## 2020-10-01 DIAGNOSIS — M25551 Pain in right hip: Secondary | ICD-10-CM | POA: Diagnosis not present

## 2020-10-01 DIAGNOSIS — Z6834 Body mass index (BMI) 34.0-34.9, adult: Secondary | ICD-10-CM | POA: Diagnosis not present

## 2020-10-01 DIAGNOSIS — F339 Major depressive disorder, recurrent, unspecified: Secondary | ICD-10-CM | POA: Diagnosis not present

## 2020-10-01 DIAGNOSIS — E782 Mixed hyperlipidemia: Secondary | ICD-10-CM | POA: Diagnosis not present

## 2020-10-01 DIAGNOSIS — Z0001 Encounter for general adult medical examination with abnormal findings: Secondary | ICD-10-CM | POA: Diagnosis not present

## 2020-10-01 DIAGNOSIS — I2699 Other pulmonary embolism without acute cor pulmonale: Secondary | ICD-10-CM | POA: Diagnosis not present

## 2020-10-13 DIAGNOSIS — M8448XG Pathological fracture, other site, subsequent encounter for fracture with delayed healing: Secondary | ICD-10-CM | POA: Diagnosis not present

## 2020-10-13 DIAGNOSIS — M545 Low back pain, unspecified: Secondary | ICD-10-CM | POA: Diagnosis not present

## 2020-10-13 DIAGNOSIS — M5416 Radiculopathy, lumbar region: Secondary | ICD-10-CM | POA: Diagnosis not present

## 2020-10-13 DIAGNOSIS — M48062 Spinal stenosis, lumbar region with neurogenic claudication: Secondary | ICD-10-CM | POA: Diagnosis not present

## 2020-10-27 DIAGNOSIS — I10 Essential (primary) hypertension: Secondary | ICD-10-CM | POA: Diagnosis not present

## 2020-10-27 DIAGNOSIS — M8448XG Pathological fracture, other site, subsequent encounter for fracture with delayed healing: Secondary | ICD-10-CM | POA: Diagnosis not present

## 2020-10-27 DIAGNOSIS — M48062 Spinal stenosis, lumbar region with neurogenic claudication: Secondary | ICD-10-CM | POA: Diagnosis not present

## 2020-10-27 DIAGNOSIS — Z6833 Body mass index (BMI) 33.0-33.9, adult: Secondary | ICD-10-CM | POA: Diagnosis not present

## 2020-10-27 DIAGNOSIS — M5416 Radiculopathy, lumbar region: Secondary | ICD-10-CM | POA: Diagnosis not present

## 2020-10-27 DIAGNOSIS — M545 Low back pain, unspecified: Secondary | ICD-10-CM | POA: Diagnosis not present

## 2020-12-08 DIAGNOSIS — I10 Essential (primary) hypertension: Secondary | ICD-10-CM | POA: Diagnosis not present

## 2020-12-29 DIAGNOSIS — Z20828 Contact with and (suspected) exposure to other viral communicable diseases: Secondary | ICD-10-CM | POA: Diagnosis not present

## 2020-12-29 DIAGNOSIS — M8580 Other specified disorders of bone density and structure, unspecified site: Secondary | ICD-10-CM | POA: Diagnosis not present

## 2021-01-17 DIAGNOSIS — E7849 Other hyperlipidemia: Secondary | ICD-10-CM | POA: Diagnosis not present

## 2021-01-17 DIAGNOSIS — F339 Major depressive disorder, recurrent, unspecified: Secondary | ICD-10-CM | POA: Diagnosis not present

## 2021-01-17 DIAGNOSIS — I10 Essential (primary) hypertension: Secondary | ICD-10-CM | POA: Diagnosis not present

## 2021-01-19 DIAGNOSIS — Z6835 Body mass index (BMI) 35.0-35.9, adult: Secondary | ICD-10-CM | POA: Diagnosis not present

## 2021-01-19 DIAGNOSIS — M1711 Unilateral primary osteoarthritis, right knee: Secondary | ICD-10-CM | POA: Diagnosis not present

## 2021-01-19 DIAGNOSIS — M1712 Unilateral primary osteoarthritis, left knee: Secondary | ICD-10-CM | POA: Diagnosis not present

## 2021-01-21 DIAGNOSIS — H25813 Combined forms of age-related cataract, bilateral: Secondary | ICD-10-CM | POA: Diagnosis not present

## 2021-01-21 DIAGNOSIS — H43812 Vitreous degeneration, left eye: Secondary | ICD-10-CM | POA: Diagnosis not present

## 2021-02-14 DIAGNOSIS — M48062 Spinal stenosis, lumbar region with neurogenic claudication: Secondary | ICD-10-CM | POA: Diagnosis not present

## 2021-02-14 DIAGNOSIS — M8448XG Pathological fracture, other site, subsequent encounter for fracture with delayed healing: Secondary | ICD-10-CM | POA: Diagnosis not present

## 2021-02-14 DIAGNOSIS — M545 Low back pain, unspecified: Secondary | ICD-10-CM | POA: Diagnosis not present

## 2021-02-14 DIAGNOSIS — M5416 Radiculopathy, lumbar region: Secondary | ICD-10-CM | POA: Diagnosis not present

## 2021-02-16 DIAGNOSIS — I10 Essential (primary) hypertension: Secondary | ICD-10-CM | POA: Diagnosis not present

## 2021-02-16 DIAGNOSIS — F339 Major depressive disorder, recurrent, unspecified: Secondary | ICD-10-CM | POA: Diagnosis not present

## 2021-02-16 DIAGNOSIS — E7849 Other hyperlipidemia: Secondary | ICD-10-CM | POA: Diagnosis not present

## 2021-02-28 DIAGNOSIS — M6740 Ganglion, unspecified site: Secondary | ICD-10-CM | POA: Diagnosis not present

## 2021-02-28 DIAGNOSIS — M19032 Primary osteoarthritis, left wrist: Secondary | ICD-10-CM | POA: Diagnosis not present

## 2021-02-28 DIAGNOSIS — M25532 Pain in left wrist: Secondary | ICD-10-CM | POA: Diagnosis not present

## 2021-04-08 DIAGNOSIS — Z6836 Body mass index (BMI) 36.0-36.9, adult: Secondary | ICD-10-CM | POA: Diagnosis not present

## 2021-04-08 DIAGNOSIS — M25571 Pain in right ankle and joints of right foot: Secondary | ICD-10-CM | POA: Diagnosis not present

## 2021-04-08 DIAGNOSIS — M1711 Unilateral primary osteoarthritis, right knee: Secondary | ICD-10-CM | POA: Diagnosis not present

## 2021-04-08 DIAGNOSIS — M1712 Unilateral primary osteoarthritis, left knee: Secondary | ICD-10-CM | POA: Diagnosis not present

## 2021-04-14 DIAGNOSIS — R350 Frequency of micturition: Secondary | ICD-10-CM | POA: Diagnosis not present

## 2021-04-14 DIAGNOSIS — N3941 Urge incontinence: Secondary | ICD-10-CM | POA: Diagnosis not present

## 2021-04-14 DIAGNOSIS — R3915 Urgency of urination: Secondary | ICD-10-CM | POA: Diagnosis not present

## 2021-04-14 DIAGNOSIS — R351 Nocturia: Secondary | ICD-10-CM | POA: Diagnosis not present

## 2021-04-18 DIAGNOSIS — I10 Essential (primary) hypertension: Secondary | ICD-10-CM | POA: Diagnosis not present

## 2021-04-18 DIAGNOSIS — E7849 Other hyperlipidemia: Secondary | ICD-10-CM | POA: Diagnosis not present

## 2021-04-18 DIAGNOSIS — F339 Major depressive disorder, recurrent, unspecified: Secondary | ICD-10-CM | POA: Diagnosis not present

## 2021-04-20 DIAGNOSIS — R110 Nausea: Secondary | ICD-10-CM | POA: Diagnosis not present

## 2021-04-20 DIAGNOSIS — K5792 Diverticulitis of intestine, part unspecified, without perforation or abscess without bleeding: Secondary | ICD-10-CM | POA: Diagnosis not present

## 2021-04-27 DIAGNOSIS — K589 Irritable bowel syndrome without diarrhea: Secondary | ICD-10-CM | POA: Diagnosis not present

## 2021-05-25 DIAGNOSIS — I10 Essential (primary) hypertension: Secondary | ICD-10-CM | POA: Diagnosis not present

## 2021-05-25 DIAGNOSIS — M1712 Unilateral primary osteoarthritis, left knee: Secondary | ICD-10-CM | POA: Diagnosis not present

## 2021-05-25 DIAGNOSIS — Z6834 Body mass index (BMI) 34.0-34.9, adult: Secondary | ICD-10-CM | POA: Diagnosis not present

## 2021-05-25 DIAGNOSIS — M1711 Unilateral primary osteoarthritis, right knee: Secondary | ICD-10-CM | POA: Diagnosis not present

## 2021-05-27 DIAGNOSIS — U071 COVID-19: Secondary | ICD-10-CM | POA: Diagnosis not present

## 2021-06-08 DIAGNOSIS — M1712 Unilateral primary osteoarthritis, left knee: Secondary | ICD-10-CM | POA: Diagnosis not present

## 2021-06-08 DIAGNOSIS — M1711 Unilateral primary osteoarthritis, right knee: Secondary | ICD-10-CM | POA: Diagnosis not present

## 2021-06-08 DIAGNOSIS — Z86711 Personal history of pulmonary embolism: Secondary | ICD-10-CM | POA: Diagnosis not present

## 2021-06-08 DIAGNOSIS — R0600 Dyspnea, unspecified: Secondary | ICD-10-CM | POA: Diagnosis not present

## 2021-06-08 DIAGNOSIS — Z1389 Encounter for screening for other disorder: Secondary | ICD-10-CM | POA: Diagnosis not present

## 2021-06-08 DIAGNOSIS — I10 Essential (primary) hypertension: Secondary | ICD-10-CM | POA: Diagnosis not present

## 2021-06-08 DIAGNOSIS — Z1331 Encounter for screening for depression: Secondary | ICD-10-CM | POA: Diagnosis not present

## 2021-06-08 DIAGNOSIS — Z20828 Contact with and (suspected) exposure to other viral communicable diseases: Secondary | ICD-10-CM | POA: Diagnosis not present

## 2021-06-09 DIAGNOSIS — R0789 Other chest pain: Secondary | ICD-10-CM | POA: Diagnosis not present

## 2021-06-09 DIAGNOSIS — Z7901 Long term (current) use of anticoagulants: Secondary | ICD-10-CM | POA: Diagnosis not present

## 2021-06-09 DIAGNOSIS — Z86718 Personal history of other venous thrombosis and embolism: Secondary | ICD-10-CM | POA: Diagnosis not present

## 2021-06-09 DIAGNOSIS — Z86711 Personal history of pulmonary embolism: Secondary | ICD-10-CM | POA: Diagnosis not present

## 2021-06-09 DIAGNOSIS — R0602 Shortness of breath: Secondary | ICD-10-CM | POA: Diagnosis not present

## 2021-06-09 DIAGNOSIS — I2699 Other pulmonary embolism without acute cor pulmonale: Secondary | ICD-10-CM | POA: Diagnosis not present

## 2021-06-15 DIAGNOSIS — Z86711 Personal history of pulmonary embolism: Secondary | ICD-10-CM | POA: Diagnosis not present

## 2021-06-15 DIAGNOSIS — M1711 Unilateral primary osteoarthritis, right knee: Secondary | ICD-10-CM | POA: Diagnosis not present

## 2021-06-15 DIAGNOSIS — R0600 Dyspnea, unspecified: Secondary | ICD-10-CM | POA: Diagnosis not present

## 2021-06-15 DIAGNOSIS — M1712 Unilateral primary osteoarthritis, left knee: Secondary | ICD-10-CM | POA: Diagnosis not present

## 2021-06-15 DIAGNOSIS — I10 Essential (primary) hypertension: Secondary | ICD-10-CM | POA: Diagnosis not present

## 2021-06-15 DIAGNOSIS — Z20828 Contact with and (suspected) exposure to other viral communicable diseases: Secondary | ICD-10-CM | POA: Diagnosis not present

## 2021-06-15 DIAGNOSIS — R600 Localized edema: Secondary | ICD-10-CM | POA: Diagnosis not present

## 2021-06-15 DIAGNOSIS — I2699 Other pulmonary embolism without acute cor pulmonale: Secondary | ICD-10-CM | POA: Diagnosis not present

## 2021-06-19 DIAGNOSIS — I10 Essential (primary) hypertension: Secondary | ICD-10-CM | POA: Diagnosis not present

## 2021-06-19 DIAGNOSIS — F339 Major depressive disorder, recurrent, unspecified: Secondary | ICD-10-CM | POA: Diagnosis not present

## 2021-06-19 DIAGNOSIS — E7849 Other hyperlipidemia: Secondary | ICD-10-CM | POA: Diagnosis not present

## 2021-06-22 DIAGNOSIS — M549 Dorsalgia, unspecified: Secondary | ICD-10-CM | POA: Diagnosis not present

## 2021-06-22 DIAGNOSIS — Z5320 Procedure and treatment not carried out because of patient's decision for unspecified reasons: Secondary | ICD-10-CM | POA: Diagnosis not present

## 2021-06-22 DIAGNOSIS — I10 Essential (primary) hypertension: Secondary | ICD-10-CM | POA: Diagnosis not present

## 2021-06-22 DIAGNOSIS — E7800 Pure hypercholesterolemia, unspecified: Secondary | ICD-10-CM | POA: Diagnosis not present

## 2021-06-22 DIAGNOSIS — Z7901 Long term (current) use of anticoagulants: Secondary | ICD-10-CM | POA: Diagnosis not present

## 2021-06-22 DIAGNOSIS — Z86711 Personal history of pulmonary embolism: Secondary | ICD-10-CM | POA: Diagnosis not present

## 2021-06-22 DIAGNOSIS — I2699 Other pulmonary embolism without acute cor pulmonale: Secondary | ICD-10-CM | POA: Diagnosis not present

## 2021-06-22 DIAGNOSIS — R079 Chest pain, unspecified: Secondary | ICD-10-CM | POA: Diagnosis not present

## 2021-06-27 DIAGNOSIS — I10 Essential (primary) hypertension: Secondary | ICD-10-CM | POA: Diagnosis not present

## 2021-07-04 DIAGNOSIS — M818 Other osteoporosis without current pathological fracture: Secondary | ICD-10-CM | POA: Diagnosis not present

## 2021-07-12 DIAGNOSIS — R102 Pelvic and perineal pain: Secondary | ICD-10-CM | POA: Diagnosis not present

## 2021-07-27 DIAGNOSIS — M1711 Unilateral primary osteoarthritis, right knee: Secondary | ICD-10-CM | POA: Diagnosis not present

## 2021-07-27 DIAGNOSIS — I2699 Other pulmonary embolism without acute cor pulmonale: Secondary | ICD-10-CM | POA: Diagnosis not present

## 2021-07-27 DIAGNOSIS — I10 Essential (primary) hypertension: Secondary | ICD-10-CM | POA: Diagnosis not present

## 2021-07-27 DIAGNOSIS — R0600 Dyspnea, unspecified: Secondary | ICD-10-CM | POA: Diagnosis not present

## 2021-07-27 DIAGNOSIS — Z20828 Contact with and (suspected) exposure to other viral communicable diseases: Secondary | ICD-10-CM | POA: Diagnosis not present

## 2021-07-27 DIAGNOSIS — M1712 Unilateral primary osteoarthritis, left knee: Secondary | ICD-10-CM | POA: Diagnosis not present

## 2021-07-27 DIAGNOSIS — Z86711 Personal history of pulmonary embolism: Secondary | ICD-10-CM | POA: Diagnosis not present

## 2021-07-27 DIAGNOSIS — R600 Localized edema: Secondary | ICD-10-CM | POA: Diagnosis not present

## 2021-08-03 DIAGNOSIS — N390 Urinary tract infection, site not specified: Secondary | ICD-10-CM | POA: Diagnosis not present

## 2021-09-07 DIAGNOSIS — N3000 Acute cystitis without hematuria: Secondary | ICD-10-CM | POA: Diagnosis not present

## 2021-09-07 DIAGNOSIS — N3941 Urge incontinence: Secondary | ICD-10-CM | POA: Diagnosis not present

## 2021-09-07 DIAGNOSIS — N3281 Overactive bladder: Secondary | ICD-10-CM | POA: Diagnosis not present

## 2021-09-08 DIAGNOSIS — K5792 Diverticulitis of intestine, part unspecified, without perforation or abscess without bleeding: Secondary | ICD-10-CM | POA: Diagnosis not present

## 2021-09-13 ENCOUNTER — Encounter (INDEPENDENT_AMBULATORY_CARE_PROVIDER_SITE_OTHER): Payer: Self-pay | Admitting: *Deleted

## 2021-09-13 DIAGNOSIS — R110 Nausea: Secondary | ICD-10-CM | POA: Diagnosis not present

## 2021-09-13 DIAGNOSIS — M545 Low back pain, unspecified: Secondary | ICD-10-CM | POA: Diagnosis not present

## 2021-09-13 DIAGNOSIS — K5792 Diverticulitis of intestine, part unspecified, without perforation or abscess without bleeding: Secondary | ICD-10-CM | POA: Diagnosis not present

## 2021-09-14 DIAGNOSIS — Z1231 Encounter for screening mammogram for malignant neoplasm of breast: Secondary | ICD-10-CM | POA: Diagnosis not present

## 2021-10-03 DIAGNOSIS — Z23 Encounter for immunization: Secondary | ICD-10-CM | POA: Diagnosis not present

## 2021-10-06 DIAGNOSIS — M545 Low back pain, unspecified: Secondary | ICD-10-CM | POA: Diagnosis not present

## 2021-10-06 DIAGNOSIS — R202 Paresthesia of skin: Secondary | ICD-10-CM | POA: Diagnosis not present

## 2021-10-06 DIAGNOSIS — I10 Essential (primary) hypertension: Secondary | ICD-10-CM | POA: Diagnosis not present

## 2021-10-06 DIAGNOSIS — M48062 Spinal stenosis, lumbar region with neurogenic claudication: Secondary | ICD-10-CM | POA: Diagnosis not present

## 2021-10-06 DIAGNOSIS — R200 Anesthesia of skin: Secondary | ICD-10-CM | POA: Diagnosis not present

## 2021-10-06 DIAGNOSIS — Z6833 Body mass index (BMI) 33.0-33.9, adult: Secondary | ICD-10-CM | POA: Diagnosis not present

## 2021-10-10 DIAGNOSIS — R9431 Abnormal electrocardiogram [ECG] [EKG]: Secondary | ICD-10-CM | POA: Diagnosis not present

## 2021-10-10 DIAGNOSIS — R600 Localized edema: Secondary | ICD-10-CM | POA: Diagnosis not present

## 2021-10-10 DIAGNOSIS — E782 Mixed hyperlipidemia: Secondary | ICD-10-CM | POA: Diagnosis not present

## 2021-10-10 DIAGNOSIS — I350 Nonrheumatic aortic (valve) stenosis: Secondary | ICD-10-CM | POA: Diagnosis not present

## 2021-10-10 DIAGNOSIS — I2699 Other pulmonary embolism without acute cor pulmonale: Secondary | ICD-10-CM | POA: Diagnosis not present

## 2021-10-10 DIAGNOSIS — I10 Essential (primary) hypertension: Secondary | ICD-10-CM | POA: Diagnosis not present

## 2021-10-19 DIAGNOSIS — E782 Mixed hyperlipidemia: Secondary | ICD-10-CM | POA: Diagnosis not present

## 2021-10-19 DIAGNOSIS — E7849 Other hyperlipidemia: Secondary | ICD-10-CM | POA: Diagnosis not present

## 2021-10-19 DIAGNOSIS — I10 Essential (primary) hypertension: Secondary | ICD-10-CM | POA: Diagnosis not present

## 2021-10-19 DIAGNOSIS — E559 Vitamin D deficiency, unspecified: Secondary | ICD-10-CM | POA: Diagnosis not present

## 2021-10-19 DIAGNOSIS — Z1329 Encounter for screening for other suspected endocrine disorder: Secondary | ICD-10-CM | POA: Diagnosis not present

## 2021-10-19 DIAGNOSIS — Z86711 Personal history of pulmonary embolism: Secondary | ICD-10-CM | POA: Diagnosis not present

## 2021-10-26 DIAGNOSIS — Z86711 Personal history of pulmonary embolism: Secondary | ICD-10-CM | POA: Diagnosis not present

## 2021-10-26 DIAGNOSIS — F339 Major depressive disorder, recurrent, unspecified: Secondary | ICD-10-CM | POA: Diagnosis not present

## 2021-10-26 DIAGNOSIS — M1712 Unilateral primary osteoarthritis, left knee: Secondary | ICD-10-CM | POA: Diagnosis not present

## 2021-10-26 DIAGNOSIS — E7849 Other hyperlipidemia: Secondary | ICD-10-CM | POA: Diagnosis not present

## 2021-10-26 DIAGNOSIS — I10 Essential (primary) hypertension: Secondary | ICD-10-CM | POA: Diagnosis not present

## 2021-10-26 DIAGNOSIS — Z6833 Body mass index (BMI) 33.0-33.9, adult: Secondary | ICD-10-CM | POA: Diagnosis not present

## 2021-10-26 DIAGNOSIS — M1711 Unilateral primary osteoarthritis, right knee: Secondary | ICD-10-CM | POA: Diagnosis not present

## 2021-11-01 DIAGNOSIS — M5416 Radiculopathy, lumbar region: Secondary | ICD-10-CM | POA: Diagnosis not present

## 2021-11-10 DIAGNOSIS — M1712 Unilateral primary osteoarthritis, left knee: Secondary | ICD-10-CM | POA: Diagnosis not present

## 2021-11-10 DIAGNOSIS — M25561 Pain in right knee: Secondary | ICD-10-CM | POA: Diagnosis not present

## 2021-11-10 DIAGNOSIS — M170 Bilateral primary osteoarthritis of knee: Secondary | ICD-10-CM | POA: Diagnosis not present

## 2021-11-10 DIAGNOSIS — M25562 Pain in left knee: Secondary | ICD-10-CM | POA: Diagnosis not present

## 2021-11-10 DIAGNOSIS — M1711 Unilateral primary osteoarthritis, right knee: Secondary | ICD-10-CM | POA: Diagnosis not present

## 2021-11-28 DIAGNOSIS — Z0001 Encounter for general adult medical examination with abnormal findings: Secondary | ICD-10-CM | POA: Diagnosis not present

## 2021-11-28 DIAGNOSIS — I10 Essential (primary) hypertension: Secondary | ICD-10-CM | POA: Diagnosis not present

## 2021-11-28 DIAGNOSIS — F339 Major depressive disorder, recurrent, unspecified: Secondary | ICD-10-CM | POA: Diagnosis not present

## 2021-11-28 DIAGNOSIS — M1711 Unilateral primary osteoarthritis, right knee: Secondary | ICD-10-CM | POA: Diagnosis not present

## 2021-11-28 DIAGNOSIS — M1712 Unilateral primary osteoarthritis, left knee: Secondary | ICD-10-CM | POA: Diagnosis not present

## 2021-11-28 DIAGNOSIS — M5416 Radiculopathy, lumbar region: Secondary | ICD-10-CM | POA: Diagnosis not present

## 2021-11-28 DIAGNOSIS — Z6833 Body mass index (BMI) 33.0-33.9, adult: Secondary | ICD-10-CM | POA: Diagnosis not present

## 2021-11-28 DIAGNOSIS — E7849 Other hyperlipidemia: Secondary | ICD-10-CM | POA: Diagnosis not present

## 2021-11-28 DIAGNOSIS — Z86711 Personal history of pulmonary embolism: Secondary | ICD-10-CM | POA: Diagnosis not present

## 2021-12-20 NOTE — Patient Instructions (Signed)
DUE TO COVID-19 ONLY ONE VISITOR IS ALLOWED TO COME WITH YOU AND STAY IN THE WAITING ROOM ONLY DURING PRE OP AND PROCEDURE.   **NO VISITORS ARE ALLOWED IN THE SHORT STAY AREA OR RECOVERY ROOM!!**  IF YOU WILL BE ADMITTED INTO THE HOSPITAL YOU ARE ALLOWED ONLY TWO SUPPORT PEOPLE DURING VISITATION HOURS ONLY (7 AM -8PM)   The support person(s) must pass our screening, gel in and out, and wear a mask at all times, including in the patients room. Patients must also wear a mask when staff or their support person are in the room. Visitors GUEST BADGE MUST BE WORN VISIBLY  One adult visitor may remain with you overnight and MUST be in the room by 8 P.M.  No visitors under the age of 46. Any visitor under the age of 54 must be accompanied by an adult.    COVID SWAB TESTING MUST BE COMPLETED ON:  12/30/21 @ 9:30 AM   Site: [facility] [place] [place] Enter: Main Entrance have a seat in the waiting area to the right of main entrance (DO NOT [place]!!!!!) Dial: [phone] to alert staff you have arrived  You are not required to quarantine, however you are required to wear a well-fitted mask when you are out and around people not in your household.  Hand Hygiene often Do NOT share personal items Notify your provider if you are in close contact with someone who has COVID or you develop fever 100.4 or greater, new onset of sneezing, cough, sore throat, shortness of breath or body aches.  [place], [address], must go inside of the hospital, NOT A DRIVE THRU!  (Must self quarantine after testing. Follow instructions on handout.)       Your procedure is scheduled on: 01/03/22   Report to [facility] Main Entrance    Report to admitting at: 10:15 AM   Call this number if you have problems the morning of surgery [phone]   Do not eat food :After Midnight.   May have liquids until  : 10:00 AM   day of surgery  CLEAR LIQUID DIET  Foods Allowed                                                                     Foods Excluded  Water, Black Coffee and tea, regular and decaf                             liquids that you cannot  Plain Jell-O in any flavor  (No red)                                           see through such as: Fruit ices (not with fruit pulp)                                     milk, soups, orange juice  Iced Popsicles (No red)                                    All solid food                                   Apple juices Sports drinks like Gatorade (No red) Lightly seasoned clear broth or consume(fat free) Sugar Sample Menu Breakfast                                Lunch                                     Supper Cranberry juice                    Beef broth                            Chicken broth Jell-O                                     Grape juice                           Apple juice Coffee or tea                        Jell-O                                      Popsicle                                                Coffee or tea                        Coffee or tea    Complete one Ensure drink the morning of surgery 3 hours prior to scheduled surgery at: 10:00 AM.    The day of surgery:  Drink ONE (1) Pre-Surgery Clear Ensure or G2 at AM the morning of surgery. Drink in one sitting. Do not sip.  This drink was given to you during your hospital  pre-op appointment visit. Nothing else to drink after completing the  Pre-Surgery Clear Ensure or G2.          If you have questions, please contact your surgeons office.  FOLLOW BOWEL PREP INSTRUCTIONS YOU RECEIVED FROM YOUR SURGEON'S OFFICE!!!   Oral Hygiene is also important to reduce your risk of infection.                                    Remember - BRUSH YOUR TEETH THE MORNING OF SURGERY WITH YOUR REGULAR TOOTHPASTE   Do NOT smoke after Midnight   Take  these medicines the  morning of surgery with A SIP OF WATER: sertraline,cetirizine,omeprazole,vibegron,montelukast.  DO NOT TAKE ANY ORAL DIABETIC MEDICATIONS DAY OF YOUR SURGERY                              You may not have any metal on your body including hair pins, jewelry, and body piercing             Do not wear make-up, lotions, powders, perfumes/cologne, or deodorant  Do not wear nail polish including gel and S&S, artificial/acrylic nails, or any other type of covering on natural nails including finger and toenails. If you have artificial nails, gel coating, etc. that needs to be removed by a nail salon please have this removed prior to surgery or surgery may need to be canceled/ delayed if the surgeon/ anesthesia feels like they are unable to be safely monitored.   Do not shave  48 hours prior to surgery.    Do not bring valuables to the hospital. [place].   Contacts, dentures or bridgework may not be worn into surgery.   Bring small overnight bag day of surgery.    Patients discharged on the day of surgery will not be allowed to drive home.  Someone needs to stay with you for the first 24 hours after anesthesia.   Special Instructions: Bring a copy of your healthcare power of attorney and living will documents         the day of surgery if you haven't scanned them before.              Please read over the following fact sheets you were given: IF YOU HAVE QUESTIONS ABOUT YOUR PRE-OP INSTRUCTIONS PLEASE CALL [phone]     [facility] - Preparing for Surgery Before surgery, you can play an important role.  Because skin is not sterile, your skin needs to be as free of germs as possible.  You can reduce the number of germs on your skin by washing with CHG (chlorahexidine gluconate) soap before surgery.  CHG is an antiseptic cleaner which kills germs and bonds with the skin to continue killing germs even after washing. Please DO NOT use if you have an  allergy to CHG or antibacterial soaps.  If your skin becomes reddened/irritated stop using the CHG and inform your nurse when you arrive at Short Stay. Do not shave (including legs and underarms) for at least 48 hours prior to the first CHG shower.  You may shave your face/neck. Please follow these instructions carefully:  1.  Shower with CHG Soap the night before surgery and the  morning of Surgery.  2.  If you choose to wash your hair, wash your hair first as usual with your  normal  shampoo.  3.  After you shampoo, rinse your hair and body thoroughly to remove the  shampoo.                           4.  Use CHG as you would any other liquid soap.  You can apply chg directly  to the skin and wash                       Gently with a scrungie or clean washcloth.  5.  Apply the CHG Soap to your body ONLY FROM THE NECK DOWN.   Do not use on face/ open                           Wound or open sores. Avoid contact with eyes, ears mouth and genitals (private parts).                       Wash face,  Genitals (private parts) with your normal soap.             6.  Wash thoroughly, paying special attention to the area where your surgery  will be performed.  7.  Thoroughly rinse your body with warm water from the neck down.  8.  DO NOT shower/wash with your normal soap after using and rinsing off  the CHG Soap.                9.  Pat yourself dry with a clean towel.            10.  Wear clean pajamas.            11.  Place clean sheets on your bed the night of your first shower and do not  sleep with pets. Day of Surgery : Do not apply any lotions/deodorants the morning of surgery.  Please wear clean clothes to the hospital/surgery center.  FAILURE TO FOLLOW THESE INSTRUCTIONS MAY RESULT IN THE CANCELLATION OF YOUR SURGERY PATIENT SIGNATURE_________________________________  NURSE SIGNATURE__________________________________  ________________________________________________________________________    Bonnie Weeks  An incentive spirometer is a tool that can help keep your lungs clear and active. This tool measures how well you are filling your lungs with each breath. Taking long deep breaths may help reverse or decrease the chance of developing breathing (pulmonary) problems (especially infection) following: A long period of time when you are unable to move or be active. BEFORE THE PROCEDURE  If the spirometer includes an indicator to show your best effort, your nurse or respiratory therapist will set it to a desired goal. If possible, sit up straight or lean slightly forward. Try not to slouch. Hold the incentive spirometer in an upright position. INSTRUCTIONS FOR USE  Sit on the edge of your bed if possible, or sit up as far as you can in bed or on a chair. Hold the incentive spirometer in an upright position. Breathe out normally. Place the mouthpiece in your mouth and seal your lips tightly around it. Breathe in slowly and as deeply as possible, raising the piston or the ball toward the top of the column. Hold your breath for 3-5 seconds or for as long as possible. Allow the piston or ball to fall to the bottom of the column. Remove the mouthpiece from your mouth and breathe out normally. Rest for a few seconds and repeat Steps 1 through 7 at least 10 times every 1-2 hours when you are awake. Take your time and take a few normal breaths between deep breaths. The spirometer may include an indicator to show your best effort. Use the indicator as a goal to work toward during each repetition. After each set of 10 deep breaths, practice coughing to be sure your lungs are clear. If you have an incision (the cut made at the time of surgery), support your incision when coughing by placing a pillow or rolled up towels firmly against it. Once you are able to get out of  bed, walk around indoors and cough well. You may stop using the incentive spirometer when instructed by your caregiver.   RISKS AND COMPLICATIONS Take your time so you do not get dizzy or light-headed. If you are in pain, you may need to take or ask for pain medication before doing incentive spirometry. It is harder to take a deep breath if you are having pain. AFTER USE Rest and breathe slowly and easily. It can be helpful to keep track of a log of your progress. Your caregiver can provide you with a simple table to help with this. If you are using the spirometer at home, follow these instructions: [place] IF:  You are having difficultly using the spirometer. You have trouble using the spirometer as often as instructed. Your pain medication is not giving enough relief while using the spirometer. You develop fever of 100.5 F (38.1 C) or higher. SEEK IMMEDIATE MEDICAL CARE IF:  You cough up bloody sputum that had not been present before. You develop fever of 102 F (38.9 C) or greater. You develop worsening pain at or near the incision site. MAKE SURE YOU:  Understand these instructions. Will watch your condition. Will get help right away if you are not doing well or get worse. Document Released: 03/19/2007 Document Revised: 01/29/2012 Document Reviewed: 05/20/2007 [facility] Patient Information 2014 [place], [place].   ________________________________________________________________________

## 2021-12-21 ENCOUNTER — Encounter (HOSPITAL_COMMUNITY)
Admission: RE | Admit: 2021-12-21 | Discharge: 2021-12-21 | Disposition: A | Discharge status: Home / Self Care | Payer: Medicare PPO | Source: Ambulatory Visit | Attending: Orthopedic Surgery | Admitting: Orthopedic Surgery

## 2021-12-21 ENCOUNTER — Encounter (HOSPITAL_COMMUNITY): Payer: Self-pay

## 2021-12-21 ENCOUNTER — Other Ambulatory Visit: Payer: Self-pay

## 2021-12-21 VITALS — BP 131/66 | HR 76 | Temp 98.0°F | Ht 63.0 in | Wt 190.0 lb

## 2021-12-21 DIAGNOSIS — M1712 Unilateral primary osteoarthritis, left knee: Secondary | ICD-10-CM | POA: Insufficient documentation

## 2021-12-21 DIAGNOSIS — Z01818 Encounter for other preprocedural examination: Secondary | ICD-10-CM | POA: Diagnosis not present

## 2021-12-21 HISTORY — DX: Dyspnea, unspecified: R06.00

## 2021-12-21 HISTORY — DX: Unspecified osteoarthritis, unspecified site: M19.90

## 2021-12-21 HISTORY — DX: Personal history of other venous thrombosis and embolism: Z86.718

## 2021-12-21 LAB — COMPREHENSIVE METABOLIC PANEL
ALT: 15 U/L (ref 0–44)
AST: 21 U/L (ref 15–41)
Albumin: 4.1 g/dL (ref 3.5–5.0)
Alkaline Phosphatase: 39 U/L (ref 38–126)
Anion gap: 11 (ref 5–15)
BUN: 16 mg/dL (ref 8–23)
CO2: 25 mmol/L (ref 22–32)
Calcium: 9.6 mg/dL (ref 8.9–10.3)
Chloride: 99 mmol/L (ref 98–111)
Creatinine, Ser: 0.76 mg/dL (ref 0.44–1.00)
GFR, Estimated: >60 mL/min (ref >60)
Glucose, Bld: 85 mg/dL (ref 70–99)
Potassium: 3.9 mmol/L (ref 3.5–5.1)
Sodium: 135 mmol/L (ref 135–145)
Total Bilirubin: 0.6 mg/dL (ref 0.3–1.2)
Total Protein: 7 g/dL (ref 6.5–8.1)

## 2021-12-21 LAB — CBC
HCT: 35.9 % — ABNORMAL LOW (ref 36.0–46.0)
Hemoglobin: 11.5 g/dL — ABNORMAL LOW (ref 12.0–15.0)
MCH: 27.8 pg (ref 26.0–34.0)
MCHC: 32 g/dL (ref 30.0–36.0)
MCV: 86.7 fL (ref 80.0–100.0)
Platelets: 273 10*3/uL (ref 150–400)
RBC: 4.14 MIL/uL (ref 3.87–5.11)
RDW: 16 % — ABNORMAL HIGH (ref 11.5–15.5)
WBC: 5.7 10*3/uL (ref 4.0–10.5)
nRBC: 0 % (ref 0.0–0.2)

## 2021-12-21 LAB — TYPE AND SCREEN
ABO/RH(D): A POS
Antibody Screen: NEGATIVE

## 2021-12-21 NOTE — Progress Notes (Signed)
COVID Vaccine Completed: Yes Date COVID Vaccine completed: 2021 x 2 COVID vaccine manufacturer:    [place] Test: 12/30/21 PCP - Dr. Ander Gaster Cardiologist -   Chest x-ray -  EKG -  Stress Test -  ECHO -  Cardiac Cath -  Pacemaker/ICD device last checked:  Sleep Study -  CPAP -   Fasting Blood Sugar -  Checks Blood Sugar _____ times a day  Blood Thinner Instructions: Xarelto will be hold starting on 12/30/21 Aspirin Instructions: Last Dose:  Anesthesia review: Hx; HTN,PE  Patient denies shortness of breath, fever, cough and chest pain at PAT appointment   Patient verbalized understanding of instructions that were given to them at the PAT appointment. Patient was also instructed that they will need to review over the PAT instructions again at home before surgery.

## 2021-12-22 LAB — SURGICAL PCR SCREEN
MRSA, PCR: POSITIVE — AB
Staphylococcus aureus: POSITIVE — AB

## 2021-12-22 NOTE — Progress Notes (Signed)
PCR: + STAPH, + MRSA.

## 2021-12-26 DIAGNOSIS — Z86711 Personal history of pulmonary embolism: Secondary | ICD-10-CM | POA: Diagnosis not present

## 2021-12-26 DIAGNOSIS — F339 Major depressive disorder, recurrent, unspecified: Secondary | ICD-10-CM | POA: Diagnosis not present

## 2021-12-26 DIAGNOSIS — M1711 Unilateral primary osteoarthritis, right knee: Secondary | ICD-10-CM | POA: Diagnosis not present

## 2021-12-26 DIAGNOSIS — E7849 Other hyperlipidemia: Secondary | ICD-10-CM | POA: Diagnosis not present

## 2021-12-26 DIAGNOSIS — M1712 Unilateral primary osteoarthritis, left knee: Secondary | ICD-10-CM | POA: Diagnosis not present

## 2021-12-26 DIAGNOSIS — M8580 Other specified disorders of bone density and structure, unspecified site: Secondary | ICD-10-CM | POA: Diagnosis not present

## 2021-12-26 DIAGNOSIS — I10 Essential (primary) hypertension: Secondary | ICD-10-CM | POA: Diagnosis not present

## 2021-12-26 DIAGNOSIS — Z01810 Encounter for preprocedural cardiovascular examination: Secondary | ICD-10-CM | POA: Diagnosis not present

## 2021-12-30 ENCOUNTER — Encounter (HOSPITAL_COMMUNITY): Payer: Medicare PPO

## 2021-12-30 DIAGNOSIS — Z20828 Contact with and (suspected) exposure to other viral communicable diseases: Secondary | ICD-10-CM | POA: Diagnosis not present

## 2021-12-30 DIAGNOSIS — Z1152 Encounter for screening for COVID-19: Secondary | ICD-10-CM | POA: Diagnosis not present

## 2022-01-02 NOTE — H&P (Signed)
TOTAL KNEE ADMISSION H&P  Patient is being admitted for left total knee arthroplasty.  Subjective:  Chief Complaint:left knee pain.  HPI: Bonnie Weeks, 76 y.o. female, has a history of pain and functional disability in the left knee due to arthritis and has failed non-surgical conservative treatments for greater than 12 weeks to includeNSAID's and/or analgesics, corticosteriod injections, and activity modification.  Onset of symptoms was gradual, starting 2 years ago with gradually worsening course since that time. The patient noted no past surgery on the left knee(s).  Patient currently rates pain in the left knee(s) at 8 out of 10 with activity. Patient has worsening of pain with activity and weight bearing, pain that interferes with activities of daily living, and pain with passive range of motion.  Patient has evidence of joint space narrowing by imaging studies. There is no active infection.  Patient Active Problem List   Diagnosis Date Noted   Other dysphagia 11/01/2016   Esophageal dysphagia 09/05/2016   Encounter for screening colonoscopy 09/05/2016   Special screening for malignant neoplasms, colon 07/04/2016   Lumbar spine scoliosis 11/09/2015   Past Medical History:  Diagnosis Date   Anemia    Arthritis    Asthma    Back pain    Cancer ([place])    right breast cancer   Depression    Dyspnea    GERD (gastroesophageal reflux disease)    Headache    History of blood clots    lung   History of hiatal hernia    Hyperlipidemia    Hypertension    Leaky heart valve     Past Surgical History:  Procedure Laterality Date   ANTERIOR CERVICAL DECOMP/DISCECTOMY FUSION  11/2014   "[place]"   ANTERIOR LAT LUMBAR FUSION Left 11/09/2015   Procedure: Lumbar two-three,lumbar three-four.lumbar four-five Anterior Lateral Lumbar Fusion;  Surgeon: Erline Levine, MD;  Location: [facility];  Service: Neurosurgery;  Laterality: Left;   BACK SURGERY     BLADDER REPAIR     BREAST  LUMPECTOMY Right    BUNIONECTOMY Bilateral    COLONOSCOPY     COLONOSCOPY WITH PROPOFOL N/A 12/01/2016   Procedure: COLONOSCOPY WITH PROPOFOL;  Surgeon: Rogene Houston, MD;  Location: [facility];  Service: Endoscopy;  Laterality: N/A;  7:30   ESOPHAGEAL DILATION N/A 12/01/2016   Procedure: ESOPHAGEAL DILATION;  Surgeon: Rogene Houston, MD;  Location: [facility];  Service: Endoscopy;  Laterality: N/A;   ESOPHAGOGASTRODUODENOSCOPY (EGD) WITH PROPOFOL N/A 12/01/2016   Procedure: ESOPHAGOGASTRODUODENOSCOPY (EGD) WITH PROPOFOL;  Surgeon: Rogene Houston, MD;  Location: [facility];  Service: Endoscopy;  Laterality: N/A;   HERNIA REPAIR     hiatal hernia repair x3   Hiatal hernia repair     NASAL SINUS SURGERY     POLYPECTOMY  12/01/2016   Procedure: POLYPECTOMY;  Surgeon: Rogene Houston, MD;  Location: [facility];  Service: Endoscopy;;  cecal polypectomy via cold biopsy   POSTERIOR LUMBAR FUSION  11/09/2015   L5-S1 with Pedicle Screws L2-LUMBAR SACRAL ONE/NOTES 11/09/2015   TONSILLECTOMY      No current facility-administered medications for this encounter.   Current Outpatient Medications  Medication Sig Dispense Refill Last Dose   atorvastatin (LIPITOR) 10 MG tablet Take 10 mg by mouth daily.      Calcium Carb-Cholecalciferol (CALCIUM 600/VITAMIN D PO) Take 1 tablet by mouth daily.      celecoxib (CELEBREX) 200 MG capsule Take 200 mg by mouth daily.  cetirizine (ZYRTEC) 10 MG tablet Take 10 mg by mouth daily.      denosumab (PROLIA) 60 MG/ML SOSY injection Inject 60 mg into the skin every 6 (six) months.      diltiazem (CARDIZEM) 30 MG tablet Take 1 tablet (30 mg total) 3 (three) times daily before meals by mouth. (Patient taking differently: Take 30 mg by mouth daily as needed (Esophageal spasms).) 90 tablet 0    hydrochlorothiazide (HYDRODIURIL) 25 MG tablet Take 25 mg by mouth daily.      HYDROcodone-acetaminophen (NORCO) 10-325 MG tablet Take 1 tablet by mouth every 6  (six) hours as needed for moderate pain.      losartan-hydrochlorothiazide (HYZAAR) 50-12.5 MG tablet Take 1 tablet by mouth daily.      montelukast (SINGULAIR) 10 MG tablet Take 10 mg by mouth daily.       Multiple Vitamins-Minerals (MULTIVITAMIN WITH MINERALS) tablet Take 1 tablet by mouth daily.      naproxen sodium (ALEVE) 220 MG tablet Take 440 mg by mouth daily.      omeprazole (PRILOSEC) 40 MG capsule Take 40 mg by mouth daily.      omeprazole-sodium bicarbonate (ZEGERID) 40-1100 MG capsule Take 1 capsule by mouth 2 (two) times daily as needed (for acid reflux.).      rivaroxaban (XARELTO) 20 MG TABS tablet Take 20 mg by mouth daily.      SUMAtriptan 6 MG/0.5ML SOAJ Inject 6 mg into the skin daily as needed (For migraine).      Vibegron (GEMTESA) 75 MG TABS Take 75 mg by mouth daily.      EPINEPHrine (EPIPEN 2-PAK) 0.3 mg/0.3 mL IJ SOAJ injection Inject 0.3 mg into the muscle daily as needed (for allergic reaction).      sertraline (ZOLOFT) 100 MG tablet Take 150 mg by mouth daily.      Allergies  Allergen Reactions   Hydromorphone Anaphylaxis   Procaine Other (See Comments)    Headache    Oxycodone Nausea And Vomiting    Social History   Tobacco Use   Smoking status: Never   Smokeless tobacco: Never  Substance Use Topics   Alcohol use: No    No family history on file.   Review of Systems  Constitutional:  Negative for chills and fever.  Respiratory:  Negative for cough and shortness of breath.   Cardiovascular:  Negative for chest pain.  Gastrointestinal:  Negative for nausea and vomiting.  Musculoskeletal:  Positive for arthralgias.    Objective:  Physical Exam Well nourished and well developed. General: Alert and oriented x3, cooperative and pleasant, no acute distress. Head: normocephalic, atraumatic, neck supple. Eyes: EOMI.  Musculoskeletal: Bilateral knee exams: No palpable effusions, warmth or erythema Bilateral genu varum Bilateral 5 degree flexion  contractures with flexion close to 110 degrees with tightness and pain Stable and intact medial and lateral collateral ligaments with some pseudolaxity medially No lower extremity edema, erythema or calf tenderness   Calves soft and nontender. Motor function intact in LE. Strength 5/5 LE bilaterally. Neuro: Distal pulses 2+. Sensation to light touch intact in LE.  Vital signs in last 24 hours:    Labs:   Estimated body mass index is 33.66 kg/m as calculated from the following:   Height as of 12/21/21: 5\' 3"  (1.6 m).   Weight as of 12/21/21: 86.2 kg.   Imaging Review Plain radiographs demonstrate severe degenerative joint disease of the left knee(s). The overall alignment isneutral. The bone quality appears to be adequate  for age and reported activity level.      Assessment/Plan:  End stage arthritis, left knee   The patient history, physical examination, clinical judgment of the provider and imaging studies are consistent with end stage degenerative joint disease of the left knee(s) and total knee arthroplasty is deemed medically necessary. The treatment options including medical management, injection therapy arthroscopy and arthroplasty were discussed at length. The risks and benefits of total knee arthroplasty were presented and reviewed. The risks due to aseptic loosening, infection, stiffness, patella tracking problems, thromboembolic complications and other imponderables were discussed. The patient acknowledged the explanation, agreed to proceed with the plan and consent was signed. Patient is being admitted for inpatient treatment for surgery, pain control, PT, OT, prophylactic antibiotics, VTE prophylaxis, progressive ambulation and ADL's and discharge planning. The patient is planning to be discharged  home.  Therapy Plans: outpatient therapy at [facility] Disposition: Home with daughter Planned DVT Prophylaxis: Xarelto 20mg  daily DME needed: none PCP: Dr. Sondra Come, clearance  received TXA: IV Allergies: Oxycodone - headache, novocaine - headache Anesthesia Concerns: none BMI: 34.4 Last HgbA1c: Not diabetic   Other: - Takes Norco 10-15 mg at night per Dr. Vertell Limber - Prefers Hydrocodone - Norco 7.5, celebrex, robaxin - Hx of PE with COVID (most recent in the summer)   Patient's anticipated LOS is less than 2 midnights, meeting these requirements: - Younger than 44 - Lives within 1 hour of care - Has a competent adult at home to recover with post-op recover - NO history of  - Chronic pain requiring opiods  - Diabetes  - Coronary Artery Disease  - Heart failure  - Heart attack  - Stroke  - DVT/VTE  - Cardiac arrhythmia  - Respiratory Failure/COPD  - Renal failure  - Anemia  - Advanced Liver disease  Costella Hatcher, PA-C Orthopedic Surgery [facility] [place] [phone]

## 2022-01-03 ENCOUNTER — Encounter (HOSPITAL_COMMUNITY)
Admission: RE | Disposition: A | Discharge status: Home / Self Care | Payer: Self-pay | Source: Ambulatory Visit | Attending: Orthopedic Surgery

## 2022-01-03 ENCOUNTER — Observation Stay (HOSPITAL_COMMUNITY)
Admission: RE | Admit: 2022-01-03 | Discharge: 2022-01-05 | Disposition: A | Discharge status: Home / Self Care | Payer: Medicare PPO | Source: Ambulatory Visit | Attending: Orthopedic Surgery | Admitting: Orthopedic Surgery

## 2022-01-03 ENCOUNTER — Ambulatory Visit (HOSPITAL_COMMUNITY): Payer: Medicare PPO | Admitting: Certified Registered Nurse Anesthetist

## 2022-01-03 ENCOUNTER — Other Ambulatory Visit: Payer: Self-pay

## 2022-01-03 ENCOUNTER — Encounter (HOSPITAL_COMMUNITY): Payer: Self-pay | Admitting: Orthopedic Surgery

## 2022-01-03 ENCOUNTER — Ambulatory Visit (HOSPITAL_BASED_OUTPATIENT_CLINIC_OR_DEPARTMENT_OTHER): Payer: Medicare PPO | Admitting: Certified Registered Nurse Anesthetist

## 2022-01-03 DIAGNOSIS — I10 Essential (primary) hypertension: Secondary | ICD-10-CM

## 2022-01-03 DIAGNOSIS — M25762 Osteophyte, left knee: Secondary | ICD-10-CM | POA: Diagnosis not present

## 2022-01-03 DIAGNOSIS — Z01818 Encounter for other preprocedural examination: Secondary | ICD-10-CM

## 2022-01-03 DIAGNOSIS — Z853 Personal history of malignant neoplasm of breast: Secondary | ICD-10-CM | POA: Insufficient documentation

## 2022-01-03 DIAGNOSIS — Z79899 Other long term (current) drug therapy: Secondary | ICD-10-CM | POA: Insufficient documentation

## 2022-01-03 DIAGNOSIS — M1712 Unilateral primary osteoarthritis, left knee: Secondary | ICD-10-CM | POA: Diagnosis not present

## 2022-01-03 DIAGNOSIS — G8918 Other acute postprocedural pain: Secondary | ICD-10-CM | POA: Diagnosis not present

## 2022-01-03 DIAGNOSIS — M25462 Effusion, left knee: Secondary | ICD-10-CM

## 2022-01-03 DIAGNOSIS — J45909 Unspecified asthma, uncomplicated: Secondary | ICD-10-CM | POA: Diagnosis not present

## 2022-01-03 DIAGNOSIS — Z96652 Presence of left artificial knee joint: Secondary | ICD-10-CM

## 2022-01-03 HISTORY — PX: TOTAL KNEE ARTHROPLASTY: SHX125

## 2022-01-03 SURGERY — ARTHROPLASTY, KNEE, TOTAL
Anesthesia: Spinal | Site: Knee | Laterality: Left

## 2022-01-03 MED ORDER — BUPIVACAINE-EPINEPHRINE (PF) 0.25% -1:200000 IJ SOLN
INTRAMUSCULAR | Status: DC | PRN
  Administered 2022-01-03: 30 mL

## 2022-01-03 MED ORDER — HYDROCODONE-ACETAMINOPHEN 5-325 MG PO TABS
1.0000 | ORAL_TABLET | ORAL | Status: DC | PRN
  Administered 2022-01-03: 2 via ORAL
  Filled 2022-01-03: qty 2

## 2022-01-03 MED ORDER — LORATADINE 10 MG PO TABS
10.0000 mg | ORAL_TABLET | Freq: Every day | ORAL | Status: DC
  Administered 2022-01-03 – 2022-01-05 (×3): 10 mg via ORAL
  Filled 2022-01-03 (×3): qty 1

## 2022-01-03 MED ORDER — PROPOFOL 1000 MG/100ML IV EMUL
INTRAVENOUS | Status: AC
  Filled 2022-01-03: qty 100

## 2022-01-03 MED ORDER — DEXAMETHASONE SODIUM PHOSPHATE 10 MG/ML IJ SOLN
INTRAMUSCULAR | Status: AC
  Filled 2022-01-03: qty 1

## 2022-01-03 MED ORDER — MIDAZOLAM HCL 2 MG/2ML IJ SOLN
INTRAMUSCULAR | Status: AC
  Filled 2022-01-03: qty 2

## 2022-01-03 MED ORDER — DEXAMETHASONE SODIUM PHOSPHATE 10 MG/ML IJ SOLN
8.0000 mg | Freq: Once | INTRAMUSCULAR | Status: AC
  Administered 2022-01-03: 8 mg via INTRAVENOUS

## 2022-01-03 MED ORDER — FENTANYL CITRATE PF 50 MCG/ML IJ SOSY
50.0000 ug | PREFILLED_SYRINGE | INTRAMUSCULAR | Status: DC
  Filled 2022-01-03: qty 1

## 2022-01-03 MED ORDER — DEXAMETHASONE SODIUM PHOSPHATE 10 MG/ML IJ SOLN
10.0000 mg | Freq: Once | INTRAMUSCULAR | Status: AC
  Administered 2022-01-04: 10 mg via INTRAVENOUS
  Filled 2022-01-03: qty 1

## 2022-01-03 MED ORDER — METHOCARBAMOL 500 MG PO TABS
500.0000 mg | ORAL_TABLET | Freq: Four times a day (QID) | ORAL | Status: DC | PRN
  Administered 2022-01-03 – 2022-01-05 (×6): 500 mg via ORAL
  Filled 2022-01-03 (×6): qty 1

## 2022-01-03 MED ORDER — POLYETHYLENE GLYCOL 3350 17 G PO PACK: 17.0000 g | PACK | Freq: Every day | ORAL | Status: DC | PRN

## 2022-01-03 MED ORDER — POVIDONE-IODINE 10 % EX SWAB
2.0000 "application " | Freq: Once | CUTANEOUS | Status: AC
  Administered 2022-01-03: 2 via TOPICAL

## 2022-01-03 MED ORDER — ONDANSETRON HCL 4 MG PO TABS: 4.0000 mg | ORAL_TABLET | Freq: Four times a day (QID) | ORAL | Status: DC | PRN

## 2022-01-03 MED ORDER — EPHEDRINE SULFATE-NACL 50-0.9 MG/10ML-% IV SOSY
PREFILLED_SYRINGE | INTRAVENOUS | Status: DC | PRN
Start: 2022-01-03 — End: 2022-01-03
  Administered 2022-01-03: 75 mg via INTRAVENOUS

## 2022-01-03 MED ORDER — HYDROCODONE-ACETAMINOPHEN 7.5-325 MG PO TABS
1.0000 | ORAL_TABLET | ORAL | Status: DC | PRN
  Administered 2022-01-03 – 2022-01-05 (×9): 2 via ORAL
  Filled 2022-01-03 (×9): qty 2

## 2022-01-03 MED ORDER — ATORVASTATIN CALCIUM 10 MG PO TABS
10.0000 mg | ORAL_TABLET | Freq: Every day | ORAL | Status: DC
  Administered 2022-01-03 – 2022-01-05 (×3): 10 mg via ORAL
  Filled 2022-01-03 (×3): qty 1

## 2022-01-03 MED ORDER — KETOROLAC TROMETHAMINE 30 MG/ML IJ SOLN
INTRAMUSCULAR | Status: AC
  Filled 2022-01-03: qty 1

## 2022-01-03 MED ORDER — MORPHINE SULFATE (PF) 2 MG/ML IV SOLN: 0.5000 mg | INTRAVENOUS | Status: DC | PRN

## 2022-01-03 MED ORDER — BUPIVACAINE IN DEXTROSE 0.75-8.25 % IT SOLN
INTRATHECAL | Status: DC | PRN
  Administered 2022-01-03: 1.4 mL via INTRATHECAL

## 2022-01-03 MED ORDER — LACTATED RINGERS IV SOLN: INTRAVENOUS | Status: DC

## 2022-01-03 MED ORDER — LIDOCAINE HCL (PF) 2 % IJ SOLN
INTRAMUSCULAR | Status: AC
  Filled 2022-01-03: qty 5

## 2022-01-03 MED ORDER — TRANEXAMIC ACID-NACL 1000-0.7 MG/100ML-% IV SOLN
1000.0000 mg | Freq: Once | INTRAVENOUS | Status: AC
  Administered 2022-01-03: 1000 mg via INTRAVENOUS
  Filled 2022-01-03: qty 100

## 2022-01-03 MED ORDER — 0.9 % SODIUM CHLORIDE (POUR BTL) OPTIME
TOPICAL | Status: DC | PRN
Start: 2022-01-03 — End: 2022-01-03
  Administered 2022-01-03: 1000 mL

## 2022-01-03 MED ORDER — PANTOPRAZOLE SODIUM 40 MG PO TBEC
40.0000 mg | DELAYED_RELEASE_TABLET | Freq: Every day | ORAL | Status: DC
  Administered 2022-01-04 – 2022-01-05 (×2): 40 mg via ORAL
  Filled 2022-01-03 (×2): qty 1

## 2022-01-03 MED ORDER — HYDROCHLOROTHIAZIDE 25 MG PO TABS
25.0000 mg | ORAL_TABLET | Freq: Every day | ORAL | Status: DC
Start: 2022-01-04 — End: 2022-01-03

## 2022-01-03 MED ORDER — CHLORHEXIDINE GLUCONATE 0.12 % MT SOLN
15.0000 mL | Freq: Once | OROMUCOSAL | Status: AC
  Administered 2022-01-03: 15 mL via OROMUCOSAL

## 2022-01-03 MED ORDER — BISACODYL 10 MG RE SUPP: 10.0000 mg | Freq: Every day | RECTAL | Status: DC | PRN

## 2022-01-03 MED ORDER — DILTIAZEM HCL 30 MG PO TABS
30.0000 mg | ORAL_TABLET | Freq: Every day | ORAL | Status: DC | PRN
  Filled 2022-01-03: qty 1

## 2022-01-03 MED ORDER — FENTANYL CITRATE PF 50 MCG/ML IJ SOSY
PREFILLED_SYRINGE | INTRAMUSCULAR | Status: AC
  Administered 2022-01-03: 50 ug via INTRAVENOUS
  Filled 2022-01-03: qty 1

## 2022-01-03 MED ORDER — ACETAMINOPHEN 325 MG PO TABS: 325.0000 mg | ORAL_TABLET | Freq: Four times a day (QID) | ORAL | Status: DC | PRN

## 2022-01-03 MED ORDER — PHENOL 1.4 % MT LIQD: 1.0000 | OROMUCOSAL | Status: DC | PRN

## 2022-01-03 MED ORDER — METOCLOPRAMIDE HCL 5 MG/ML IJ SOLN
5.0000 mg | Freq: Three times a day (TID) | INTRAMUSCULAR | Status: DC | PRN
  Administered 2022-01-04: 10 mg via INTRAVENOUS
  Filled 2022-01-03: qty 2

## 2022-01-03 MED ORDER — FERROUS SULFATE 325 (65 FE) MG PO TABS
325.0000 mg | ORAL_TABLET | Freq: Three times a day (TID) | ORAL | Status: DC
  Administered 2022-01-03 – 2022-01-04 (×2): 325 mg via ORAL
  Filled 2022-01-03 (×3): qty 1

## 2022-01-03 MED ORDER — ONDANSETRON HCL 4 MG/2ML IJ SOLN
INTRAMUSCULAR | Status: AC
  Filled 2022-01-03: qty 2

## 2022-01-03 MED ORDER — METOCLOPRAMIDE HCL 5 MG PO TABS: 5.0000 mg | ORAL_TABLET | Freq: Three times a day (TID) | ORAL | Status: DC | PRN

## 2022-01-03 MED ORDER — BUPIVACAINE-EPINEPHRINE (PF) 0.25% -1:200000 IJ SOLN
INTRAMUSCULAR | Status: AC
  Filled 2022-01-03: qty 30

## 2022-01-03 MED ORDER — DIPHENHYDRAMINE HCL 12.5 MG/5ML PO ELIX: 12.5000 mg | ORAL_SOLUTION | ORAL | Status: DC | PRN

## 2022-01-03 MED ORDER — SERTRALINE HCL 50 MG PO TABS
150.0000 mg | ORAL_TABLET | Freq: Every day | ORAL | Status: DC
  Administered 2022-01-04 – 2022-01-05 (×2): 150 mg via ORAL
  Filled 2022-01-03 (×2): qty 1

## 2022-01-03 MED ORDER — DOCUSATE SODIUM 100 MG PO CAPS
100.0000 mg | ORAL_CAPSULE | Freq: Two times a day (BID) | ORAL | Status: DC
  Administered 2022-01-03 – 2022-01-05 (×4): 100 mg via ORAL
  Filled 2022-01-03 (×4): qty 1

## 2022-01-03 MED ORDER — MORPHINE SULFATE (PF) 2 MG/ML IV SOLN: 1.0000 mg | INTRAVENOUS | Status: DC | PRN

## 2022-01-03 MED ORDER — CEFAZOLIN SODIUM-DEXTROSE 2-4 GM/100ML-% IV SOLN
2.0000 g | INTRAVENOUS | Status: AC
  Administered 2022-01-03: 2 g via INTRAVENOUS
  Filled 2022-01-03: qty 100

## 2022-01-03 MED ORDER — SODIUM CHLORIDE 0.9 % IV SOLN: INTRAVENOUS | Status: DC

## 2022-01-03 MED ORDER — VANCOMYCIN HCL IN DEXTROSE 1-5 GM/200ML-% IV SOLN
1000.0000 mg | INTRAVENOUS | Status: AC
  Administered 2022-01-03: 1000 mg via INTRAVENOUS
  Filled 2022-01-03: qty 200

## 2022-01-03 MED ORDER — ONDANSETRON HCL 4 MG/2ML IJ SOLN: 4.0000 mg | Freq: Once | INTRAMUSCULAR | Status: DC | PRN

## 2022-01-03 MED ORDER — LOSARTAN POTASSIUM-HCTZ 50-12.5 MG PO TABS: 1.0000 | ORAL_TABLET | Freq: Every day | ORAL | Status: DC

## 2022-01-03 MED ORDER — TRANEXAMIC ACID-NACL 1000-0.7 MG/100ML-% IV SOLN
1000.0000 mg | INTRAVENOUS | Status: AC
  Administered 2022-01-03: 1000 mg via INTRAVENOUS
  Filled 2022-01-03: qty 100

## 2022-01-03 MED ORDER — LOSARTAN POTASSIUM 50 MG PO TABS
50.0000 mg | ORAL_TABLET | Freq: Every day | ORAL | Status: DC
  Administered 2022-01-04 – 2022-01-05 (×2): 50 mg via ORAL
  Filled 2022-01-03 (×2): qty 1

## 2022-01-03 MED ORDER — PHENYLEPHRINE HCL-NACL 20-0.9 MG/250ML-% IV SOLN
INTRAVENOUS | Status: DC | PRN
  Administered 2022-01-03: 20 ug/min via INTRAVENOUS

## 2022-01-03 MED ORDER — CEFAZOLIN SODIUM-DEXTROSE 2-4 GM/100ML-% IV SOLN
2.0000 g | Freq: Four times a day (QID) | INTRAVENOUS | Status: AC
  Administered 2022-01-03 – 2022-01-04 (×2): 2 g via INTRAVENOUS
  Filled 2022-01-03 (×2): qty 100

## 2022-01-03 MED ORDER — MENTHOL 3 MG MT LOZG: 1.0000 | LOZENGE | OROMUCOSAL | Status: DC | PRN

## 2022-01-03 MED ORDER — PROPOFOL 10 MG/ML IV BOLUS
INTRAVENOUS | Status: DC | PRN
Start: 2022-01-03 — End: 2022-01-03
  Administered 2022-01-03: 20 mg via INTRAVENOUS

## 2022-01-03 MED ORDER — BUPIVACAINE HCL (PF) 0.5 % IJ SOLN
INTRAMUSCULAR | Status: DC | PRN
  Administered 2022-01-03: 20 mL via PERINEURAL

## 2022-01-03 MED ORDER — HYDROCHLOROTHIAZIDE 12.5 MG PO TABS
12.5000 mg | ORAL_TABLET | Freq: Every day | ORAL | Status: DC
  Administered 2022-01-04 – 2022-01-05 (×2): 12.5 mg via ORAL
  Filled 2022-01-03 (×2): qty 1

## 2022-01-03 MED ORDER — MONTELUKAST SODIUM 10 MG PO TABS
10.0000 mg | ORAL_TABLET | Freq: Every day | ORAL | Status: DC
  Administered 2022-01-03 – 2022-01-05 (×3): 10 mg via ORAL
  Filled 2022-01-03 (×3): qty 1

## 2022-01-03 MED ORDER — SODIUM CHLORIDE 0.9 % IR SOLN
Status: DC | PRN
  Administered 2022-01-03: 1000 mL

## 2022-01-03 MED ORDER — METHOCARBAMOL 500 MG IVPB - SIMPLE MED
500.0000 mg | Freq: Four times a day (QID) | INTRAVENOUS | Status: DC | PRN
  Filled 2022-01-03: qty 50

## 2022-01-03 MED ORDER — ONDANSETRON HCL 4 MG/2ML IJ SOLN
4.0000 mg | Freq: Four times a day (QID) | INTRAMUSCULAR | Status: DC | PRN
  Administered 2022-01-04 (×2): 4 mg via INTRAVENOUS
  Filled 2022-01-03 (×2): qty 2

## 2022-01-03 MED ORDER — ACETAMINOPHEN 10 MG/ML IV SOLN: 1000.0000 mg | Freq: Once | INTRAVENOUS | Status: DC | PRN

## 2022-01-03 MED ORDER — PROPOFOL 500 MG/50ML IV EMUL
INTRAVENOUS | Status: DC | PRN
  Administered 2022-01-03: 75 ug/kg/min via INTRAVENOUS

## 2022-01-03 MED ORDER — STERILE WATER FOR IRRIGATION IR SOLN
Status: DC | PRN
  Administered 2022-01-03: 2000 mL

## 2022-01-03 MED ORDER — SODIUM CHLORIDE (PF) 0.9 % IJ SOLN
INTRAMUSCULAR | Status: DC | PRN
  Administered 2022-01-03: 30 mL

## 2022-01-03 MED ORDER — VANCOMYCIN HCL IN DEXTROSE 1-5 GM/200ML-% IV SOLN: 1000.0000 mg | Freq: Once | INTRAVENOUS | Status: DC

## 2022-01-03 MED ORDER — KETOROLAC TROMETHAMINE 30 MG/ML IJ SOLN
INTRAMUSCULAR | Status: DC | PRN
  Administered 2022-01-03: 30 mg

## 2022-01-03 MED ORDER — MIDAZOLAM HCL 2 MG/2ML IJ SOLN: 1.0000 mg | INTRAMUSCULAR | Status: DC

## 2022-01-03 MED ORDER — ORAL CARE MOUTH RINSE: 15.0000 mL | Freq: Once | OROMUCOSAL | Status: AC

## 2022-01-03 MED ORDER — VIBEGRON 75 MG PO TABS: 75.0000 mg | ORAL_TABLET | Freq: Every day | ORAL | Status: DC

## 2022-01-03 MED ORDER — ONDANSETRON HCL 4 MG/2ML IJ SOLN
INTRAMUSCULAR | Status: DC | PRN
Start: 2022-01-03 — End: 2022-01-03
  Administered 2022-01-03: 4 mg via INTRAVENOUS

## 2022-01-03 MED ORDER — SODIUM CHLORIDE (PF) 0.9 % IJ SOLN
INTRAMUSCULAR | Status: AC
  Filled 2022-01-03: qty 30

## 2022-01-03 MED ORDER — RIVAROXABAN 10 MG PO TABS
20.0000 mg | ORAL_TABLET | Freq: Every day | ORAL | Status: DC
  Administered 2022-01-04 – 2022-01-05 (×2): 20 mg via ORAL
  Filled 2022-01-03 (×2): qty 2

## 2022-01-03 SURGICAL SUPPLY — 51 items
ATTUNE MED ANAT PAT 38 KNEE (Knees) ×1 IMPLANT
ATTUNE PSFEM LTSZ6 NARCEM KNEE (Femur) ×1 IMPLANT
ATTUNE PSRP INSR SZ6 10 KNEE (Insert) ×1 IMPLANT
BAG COUNTER SPONGE SURGICOUNT (BAG) ×1 IMPLANT
BAG ZIPLOCK 12X15 (MISCELLANEOUS) ×1 IMPLANT
BASE TIBIA ATTUNE KNEE SYS SZ6 (Knees) IMPLANT
BLADE SAW SGTL 11.0X1.19X90.0M (BLADE) IMPLANT
BLADE SAW SGTL 13.0X1.19X90.0M (BLADE) ×2 IMPLANT
BLADE SURG SZ10 CARB STEEL (BLADE) ×4 IMPLANT
BNDG ELASTIC 6X5.8 VLCR STR LF (GAUZE/BANDAGES/DRESSINGS) ×2 IMPLANT
BOWL SMART MIX CTS (DISPOSABLE) ×2 IMPLANT
CEMENT HV SMART SET (Cement) ×2 IMPLANT
CUFF TOURN SGL QUICK 34 (TOURNIQUET CUFF) ×1
CUFF TRNQT CYL 34X4.125X (TOURNIQUET CUFF) ×1 IMPLANT
DERMABOND ADVANCED (GAUZE/BANDAGES/DRESSINGS) ×1
DERMABOND ADVANCED .7 DNX12 (GAUZE/BANDAGES/DRESSINGS) ×1 IMPLANT
DRAPE INCISE IOBAN 66X45 STRL (DRAPES) ×2 IMPLANT
DRAPE U-SHAPE 47X51 STRL (DRAPES) ×2 IMPLANT
DRESSING AQUACEL AG SP 3.5X10 (GAUZE/BANDAGES/DRESSINGS) ×1 IMPLANT
DRSG AQUACEL AG ADV 3.5X10 (GAUZE/BANDAGES/DRESSINGS) ×1 IMPLANT
DRSG AQUACEL AG SP 3.5X10 (GAUZE/BANDAGES/DRESSINGS) ×2
DURAPREP 26ML APPLICATOR (WOUND CARE) ×4 IMPLANT
ELECT REM PT RETURN 15FT ADLT (MISCELLANEOUS) ×2 IMPLANT
GLOVE SURG ENC MOIS LTX SZ6 (GLOVE) ×2 IMPLANT
GLOVE SURG ENC MOIS LTX SZ7 (GLOVE) ×2 IMPLANT
GLOVE SURG UNDER POLY LF SZ7.5 (GLOVE) ×2 IMPLANT
GOWN STRL REUS W/TWL LRG LVL3 (GOWN DISPOSABLE) ×2 IMPLANT
HANDPIECE INTERPULSE COAX TIP (DISPOSABLE) ×1
HOLDER FOLEY CATH W/STRAP (MISCELLANEOUS) ×1 IMPLANT
KIT TURNOVER KIT A (KITS) ×1 IMPLANT
MANIFOLD NEPTUNE II (INSTRUMENTS) ×2 IMPLANT
NDL SAFETY ECLIPSE 18X1.5 (NEEDLE) IMPLANT
NEEDLE HYPO 18GX1.5 SHARP (NEEDLE)
NS IRRIG 1000ML POUR BTL (IV SOLUTION) ×2 IMPLANT
PACK TOTAL KNEE CUSTOM (KITS) ×2 IMPLANT
PROTECTOR NERVE ULNAR (MISCELLANEOUS) ×2 IMPLANT
SET HNDPC FAN SPRY TIP SCT (DISPOSABLE) ×1 IMPLANT
SET PAD KNEE POSITIONER (MISCELLANEOUS) ×2 IMPLANT
SPIKE FLUID TRANSFER (MISCELLANEOUS) ×4 IMPLANT
SPONGE T-LAP 18X18 ~~LOC~~+RFID (SPONGE) ×6 IMPLANT
SUT MNCRL AB 4-0 PS2 18 (SUTURE) ×2 IMPLANT
SUT STRATAFIX PDS+ 0 24IN (SUTURE) ×2 IMPLANT
SUT VIC AB 1 CT1 36 (SUTURE) ×2 IMPLANT
SUT VIC AB 2-0 CT1 27 (SUTURE) ×3
SUT VIC AB 2-0 CT1 TAPERPNT 27 (SUTURE) ×3 IMPLANT
SYR 3ML LL SCALE MARK (SYRINGE) ×2 IMPLANT
TIBIA ATTUNE KNEE SYS BASE SZ6 (Knees) ×2 IMPLANT
TRAY FOLEY MTR SLVR 16FR STAT (SET/KITS/TRAYS/PACK) ×2 IMPLANT
TUBE SUCTION HIGH CAP CLEAR NV (SUCTIONS) ×2 IMPLANT
WATER STERILE IRR 1000ML POUR (IV SOLUTION) ×4 IMPLANT
WRAP KNEE MAXI GEL POST OP (GAUZE/BANDAGES/DRESSINGS) ×2 IMPLANT

## 2022-01-03 NOTE — Care Plan (Signed)
Ortho Bundle Case Management Note  Patient Details  Name: Bonnie Weeks MRN: [id number] Date of Birth: 1946-03-13                  L TKA on 01-03-22 DCP: Home with dtr and husband DME: No needs, has a RW PT: [facility]. PT referral sent for [facility] 01-06-22.   DME Arranged:  N/A DME Agency:     HH Arranged:    HH Agency:     Additional Comments: Please contact me with any questions of if this plan should need to change.  Marianne Sofia, RN,CCM [facility]  [phone] 01/03/2022, 2:16 PM

## 2022-01-03 NOTE — Anesthesia Procedure Notes (Signed)
Anesthesia Regional Block: Adductor canal block   Pre-Anesthetic Checklist: , timeout performed,  Correct Patient, Correct Site, Correct Laterality,  Correct Procedure, Correct Position, site marked,  Risks and benefits discussed,  Surgical consent,  Pre-op evaluation,  At surgeon's request and post-op pain management  Laterality: Left  Prep: chloraprep       Needles:  Injection technique: Single-shot  Needle Type: Echogenic Needle     Needle Length: 9cm      Additional Needles:   Procedures:,,,, ultrasound used (permanent image in chart),,    Narrative:  Start time: 01/03/2022 12:20 PM End time: 01/03/2022 12:26 PM Injection made incrementally with aspirations every 5 mL.  Performed by: Personally  Anesthesiologist: Myrtie Soman, MD  Additional Notes: Patient tolerated the procedure well without complications

## 2022-01-03 NOTE — Anesthesia Procedure Notes (Signed)
Spinal  Patient location during procedure: OR Start time: 01/03/2022 12:45 PM End time: 01/03/2022 12:53 PM Reason for block: surgical anesthesia Staffing Performed: anesthesiologist  Anesthesiologist: Myrtie Soman, MD Preanesthetic Checklist Completed: patient identified, IV checked, site marked, risks and benefits discussed, surgical consent, monitors and equipment checked, pre-op evaluation and timeout performed Spinal Block Patient position: sitting Prep: Betadine Patient monitoring: heart rate, continuous pulse ox and blood pressure Approach: right paramedian Location: L3-4 Injection technique: single-shot Needle Needle type: Quincke  Needle gauge: 22 G Needle length: 9 cm Assessment Sensory level: T6 Events: CSF return Additional Notes

## 2022-01-03 NOTE — Anesthesia Procedure Notes (Signed)
Anesthesia Procedure Image    

## 2022-01-03 NOTE — Interval H&P Note (Signed)
History and Physical Interval Note:  01/03/2022 11:26 AM  Bonnie Weeks  has presented today for surgery, with the diagnosis of Left knee osteoarthritis.  The various methods of treatment have been discussed with the patient and family. After consideration of risks, benefits and other options for treatment, the patient has consented to  Procedure(s): TOTAL KNEE ARTHROPLASTY (Left) as a surgical intervention.  The patient's history has been reviewed, patient examined, no change in status, stable for surgery.  I have reviewed the patient's chart and labs.  Questions were answered to the patient's satisfaction.     Mauri Pole

## 2022-01-03 NOTE — Progress Notes (Signed)
Assisted Dr. Rose with left, ultrasound guided, adductor canal block. Side rails up, monitors on throughout procedure. See vital signs in flow sheet. Tolerated Procedure well.  

## 2022-01-03 NOTE — Transfer of Care (Signed)
Immediate Anesthesia Transfer of Care Note  Patient: Bonnie Weeks  Procedure(s) Performed: TOTAL KNEE ARTHROPLASTY (Left: Knee)  Patient Location: PACU  Anesthesia Type:Spinal  Level of Consciousness: awake, alert , oriented and patient cooperative  Airway & Oxygen Therapy: Patient Spontanous Breathing and Patient connected to face mask  Post-op Assessment: Report given to RN and Post -op Vital signs reviewed and stable  Post vital signs: Reviewed and stable  Last Vitals:  Vitals Value Taken Time  BP 109/47 01/03/22 1430  Temp    Pulse 67 01/03/22 1431  Resp 15 01/03/22 1431  SpO2 100 % 01/03/22 1431  Vitals shown include unvalidated device data.  Last Pain:  Vitals:   01/03/22 1049  TempSrc:   PainSc: 0-No pain      Patients Stated Pain Goal: 4 (22/84/06 9861)  Complications: No notable events documented.

## 2022-01-03 NOTE — Anesthesia Postprocedure Evaluation (Signed)
Anesthesia Post Note  Patient: Bonnie Weeks  Procedure(s) Performed: TOTAL KNEE ARTHROPLASTY (Left: Knee)     Patient location during evaluation: PACU Anesthesia Type: Spinal Level of consciousness: oriented and awake and alert Pain management: pain level controlled Vital Signs Assessment: post-procedure vital signs reviewed and stable Respiratory status: spontaneous breathing, respiratory function stable and patient connected to nasal cannula oxygen Cardiovascular status: blood pressure returned to baseline and stable Postop Assessment: no headache, no backache and no apparent nausea or vomiting Anesthetic complications: no   No notable events documented.  Last Vitals:  Vitals:   01/03/22 1600 01/03/22 1624  BP: 134/68 134/71  Pulse: 68 64  Resp: 18   Temp: (!) 35.9 C 36.4 C  SpO2: 100% 99%    Last Pain:  Vitals:   01/03/22 1624  TempSrc: Oral  PainSc:                  [name]

## 2022-01-03 NOTE — Op Note (Signed)
NAME:  Bonnie Weeks                      MEDICAL RECORD NO.:  [id number]                             FACILITY:  [facility]      PHYSICIAN:  Pietro Cassis. Alvan Dame, M.D.  DATE OF BIRTH:  1946/08/31      DATE OF PROCEDURE:  01/03/2022                                     OPERATIVE REPORT         PREOPERATIVE DIAGNOSIS:  Left knee osteoarthritis.      POSTOPERATIVE DIAGNOSIS:  Left knee osteoarthritis.      FINDINGS:  The patient was noted to have complete loss of cartilage and   bone-on-bone arthritis with associated osteophytes in the medial and patellofemoral compartments of   the knee with a significant synovitis and associated effusion.  The patient had failed months of conservative treatment including medications, injection therapy, activity modification.     PROCEDURE:  Left total knee replacement.      COMPONENTS USED:  DePuy Attune rotating platform posterior stabilized knee   system, a size 6N femur, 6 tibia, size 10 mm PS AOX insert, and 38 anatomic patellar   button.      SURGEON:  Pietro Cassis. Alvan Dame, M.D.      ASSISTANT:  Costella Hatcher, PA-C.      ANESTHESIA:  Regional and Spinal.      SPECIMENS:  None.      COMPLICATION:  None.      DRAINS:  None.  EBL: <100 cc      TOURNIQUET TIME:  33 min at 225 mmHg     The patient was stable to the recovery room.      INDICATION FOR PROCEDURE:  Bonnie Weeks is a 76 y.o. female patient of   mine.  The patient had been seen, evaluated, and treated for months conservatively in the   office with medication, activity modification, and injections.  The patient had   radiographic changes of bone-on-bone arthritis with endplate sclerosis and osteophytes noted.  Based on the radiographic changes and failed conservative measures, the patient   decided to proceed with definitive treatment, total knee replacement.  Risks of infection, DVT, component failure, need for revision surgery, neurovascular injury were reviewed in the office setting.  The  postop course was reviewed stressing the efforts to maximize post-operative satisfaction and function.  Consent was obtained for benefit of pain   relief.      PROCEDURE IN DETAIL:  The patient was brought to the operative theater.   Once adequate anesthesia, preoperative antibiotics, 2 gm of Ancef,1 gm of Tranexamic Acid, and 10 mg of Decadron administered, the patient was positioned supine with a left thigh tourniquet placed.  The  left lower extremity was prepped and draped in sterile fashion.  A time-   out was performed identifying the patient, planned procedure, and the appropriate extremity.      The left lower extremity was placed in the [facility] leg holder.  The leg was   exsanguinated, tourniquet elevated to 250 mmHg.  A midline incision was   made followed by median parapatellar arthrotomy.  Following initial   exposure, attention was  first directed to the patella.  Precut   measurement was noted to be 22 mm.  I resected down to 14 mm and used a   38 anatomic patellar button to restore patellar height as well as cover the cut surface.      The lug holes were drilled and a metal shim was placed to protect the   patella from retractors and saw blade during the procedure.      At this point, attention was now directed to the femur.  The femoral   canal was opened with a drill, irrigated to try to prevent fat emboli.  An   intramedullary rod was passed at 3 degrees valgus, 9 mm of bone was   resected off the distal femur.  Following this resection, the tibia was   subluxated anteriorly.  Using the extramedullary guide, 2 mm of bone was resected off   the proximal medial tibia.  We confirmed the gap would be   stable medially and laterally with a size 6 spacer block as well as confirmed that the tibial cut was perpendicular in the coronal plane, checking with an alignment rod.      Once this was done, I sized the femur to be a size 6 in the anterior-   posterior dimension, chose a  narrow component based on medial and   lateral dimension.  The size 6 rotation block was then pinned in   position anterior referenced using the C-clamp to set rotation.  The   anterior, posterior, and  chamfer cuts were made without difficulty nor   notching making certain that I was along the anterior cortex to help   with flexion gap stability.      The final box cut was made off the lateral aspect of distal femur.      At this point, the tibia was sized to be a size 6.  The size 6 tray was   then pinned in position through the medial third of the tubercle,   drilled, and keel punched.  Trial reduction was now carried with a 6 femur,  6 tibia, a size size 10 mm PS insert, and the 38 anatomic patella botton.  The knee was brought to full extension with good flexion stability with the patella   tracking through the trochlea without application of pressure.  Given   all these findings the trial components removed.  Final components were   opened and cement was mixed.  The knee was irrigated with normal saline solution and pulse lavage.  The synovial lining was   then injected with 30 cc of 0.25% Marcaine with epinephrine, 1 cc of Toradol and 30 cc of NS for a total of 61 cc.     Final implants were then cemented onto cleaned and dried cut surfaces of bone with the knee brought to extension with a size 10 mm PS trial insert.      Once the cement had fully cured, excess cement was removed   throughout the knee.  I confirmed that I was satisfied with the range of   motion and stability, and the final size 10 mm PS AOX insert was chosen.  It was   placed into the knee.      The tourniquet had been let down at 33 minutes.  No significant   hemostasis was required.  The extensor mechanism was then reapproximated using #1 Vicryl and #1 Stratafix sutures with the knee   in flexion.  The  remaining wound was closed with 2-0 Vicryl and running 4-0 Monocryl.   The knee was cleaned, dried, dressed  sterilely using Dermabond and   Aquacel dressing.  The patient was then   brought to recovery room in stable condition, tolerating the procedure   well.   Please note that Physician Assistant, Costella Hatcher, PA-C was present for the entirety of the case, and was utilized for pre-operative positioning, peri-operative retractor management, general facilitation of the procedure and for primary wound closure at the end of the case.              Pietro Cassis Alvan Dame, M.D.    01/03/2022 12:50 PM

## 2022-01-03 NOTE — Anesthesia Procedure Notes (Signed)
Procedure Name: MAC Date/Time: 01/03/2022 1:00 PM Performed by: Claudia Desanctis, CRNA Pre-anesthesia Checklist: Patient identified, Emergency Drugs available, Suction available and Patient being monitored Patient Re-evaluated:Patient Re-evaluated prior to induction Oxygen Delivery Method: Simple face mask

## 2022-01-03 NOTE — Anesthesia Preprocedure Evaluation (Signed)
Anesthesia Evaluation  Patient identified by MRN, date of birth, ID band Patient awake    Reviewed: Allergy & Precautions, NPO status , Patient's Chart, lab work & pertinent test results  Airway Mallampati: II  TM Distance: >3 FB Neck ROM: Limited    Dental no notable dental hx.    Pulmonary PE   Pulmonary exam normal breath sounds clear to auscultation       Cardiovascular hypertension, Pt. on medications Normal cardiovascular exam Rhythm:Regular Rate:Normal     Neuro/Psych negative neurological ROS  negative psych ROS   GI/Hepatic Neg liver ROS, GERD  Medicated,  Endo/Other  negative endocrine ROS  Renal/GU negative Renal ROS  negative genitourinary   Musculoskeletal Significant scoliosis L2-S1 fusion   Abdominal   Peds negative pediatric ROS (+)  Hematology negative hematology ROS (+)   Anesthesia Other Findings   Reproductive/Obstetrics negative OB ROS                             Anesthesia Physical Anesthesia Plan  ASA: 3  Anesthesia Plan: Spinal   Post-op Pain Management: Regional block*   Induction: Intravenous  PONV Risk Score and Plan: 2 and Ondansetron, Dexamethasone, Propofol infusion and Treatment may vary due to age or medical condition  Airway Management Planned: Simple Face Mask  Additional Equipment:   Intra-op Plan:   Post-operative Plan:   Informed Consent: I have reviewed the patients History and Physical, chart, labs and discussed the procedure including the risks, benefits and alternatives for the proposed anesthesia with the patient or authorized representative who has indicated his/her understanding and acceptance.     Dental advisory given  Plan Discussed with: CRNA and Surgeon  Anesthesia Plan Comments:         Anesthesia Quick Evaluation

## 2022-01-03 NOTE — Discharge Instructions (Signed)

## 2022-01-03 NOTE — Plan of Care (Signed)
  Problem: Activity: Goal: Risk for activity intolerance will decrease Outcome: Progressing   Problem: Pain Managment: Goal: General experience of comfort will improve Outcome: Progressing   Problem: Safety: Goal: Ability to remain free from injury will improve Outcome: Progressing   

## 2022-01-04 ENCOUNTER — Encounter (HOSPITAL_COMMUNITY): Payer: Self-pay | Admitting: Orthopedic Surgery

## 2022-01-04 DIAGNOSIS — I10 Essential (primary) hypertension: Secondary | ICD-10-CM | POA: Diagnosis not present

## 2022-01-04 DIAGNOSIS — Z853 Personal history of malignant neoplasm of breast: Secondary | ICD-10-CM | POA: Diagnosis not present

## 2022-01-04 DIAGNOSIS — Z79899 Other long term (current) drug therapy: Secondary | ICD-10-CM | POA: Diagnosis not present

## 2022-01-04 DIAGNOSIS — M1712 Unilateral primary osteoarthritis, left knee: Secondary | ICD-10-CM | POA: Diagnosis not present

## 2022-01-04 DIAGNOSIS — J45909 Unspecified asthma, uncomplicated: Secondary | ICD-10-CM | POA: Diagnosis not present

## 2022-01-04 LAB — CBC
HCT: 31.1 % — ABNORMAL LOW (ref 36.0–46.0)
Hemoglobin: 10.1 g/dL — ABNORMAL LOW (ref 12.0–15.0)
MCH: 28.2 pg (ref 26.0–34.0)
MCHC: 32.5 g/dL (ref 30.0–36.0)
MCV: 86.9 fL (ref 80.0–100.0)
Platelets: 222 10*3/uL (ref 150–400)
RBC: 3.58 MIL/uL — ABNORMAL LOW (ref 3.87–5.11)
RDW: 15.3 % (ref 11.5–15.5)
WBC: 10.2 10*3/uL (ref 4.0–10.5)
nRBC: 0 % (ref 0.0–0.2)

## 2022-01-04 LAB — BASIC METABOLIC PANEL
Anion gap: 8 (ref 5–15)
BUN: 13 mg/dL (ref 8–23)
CO2: 27 mmol/L (ref 22–32)
Calcium: 8.7 mg/dL — ABNORMAL LOW (ref 8.9–10.3)
Chloride: 101 mmol/L (ref 98–111)
Creatinine, Ser: 0.68 mg/dL (ref 0.44–1.00)
GFR, Estimated: >60 mL/min (ref >60)
Glucose, Bld: 168 mg/dL — ABNORMAL HIGH (ref 70–99)
Potassium: 3.9 mmol/L (ref 3.5–5.1)
Sodium: 136 mmol/L (ref 135–145)

## 2022-01-04 MED ORDER — HYDROCODONE-ACETAMINOPHEN 7.5-325 MG PO TABS: 1.0000 | ORAL_TABLET | ORAL | 0 refills | Status: DC | PRN

## 2022-01-04 MED ORDER — KETOROLAC TROMETHAMINE 15 MG/ML IJ SOLN
7.5000 mg | Freq: Four times a day (QID) | INTRAMUSCULAR | Status: AC
  Administered 2022-01-04 (×2): 7.5 mg via INTRAVENOUS
  Filled 2022-01-04 (×2): qty 1

## 2022-01-04 MED ORDER — METHOCARBAMOL 500 MG PO TABS: 500.0000 mg | ORAL_TABLET | Freq: Four times a day (QID) | ORAL | 0 refills | Status: DC | PRN

## 2022-01-04 MED ORDER — POLYETHYLENE GLYCOL 3350 17 G PO PACK: 17.0000 g | PACK | Freq: Every day | ORAL | 0 refills | Status: AC | PRN

## 2022-01-04 MED ORDER — DOCUSATE SODIUM 100 MG PO CAPS: 100.0000 mg | ORAL_CAPSULE | Freq: Two times a day (BID) | ORAL | 0 refills | Status: DC

## 2022-01-04 NOTE — Progress Notes (Signed)
Physical Therapy Treatment Patient Details Name: Bonnie Weeks MRN: [id number] DOB: 1946-09-20 Today's Date: 01/04/2022   History of Present Illness 76 yo s/p L TKA 01/03/22, PMH: back fusion    PT Comments    The  patient is sleepy but arouses. Ambulated x 50' with frequent cues for sequence. Then 20'x 2 more to Bathe room. Patient has not progressed well enough to achieve PT goals for DC home today . Family present, RN aware,. Plan PT in AM, steps , patient should progres for Dc.  Recommendations for follow up therapy are one component of a multi-disciplinary discharge planning process, led by the attending physician.  Recommendations may be updated based on patient status, additional functional criteria and insurance authorization.  Follow Up Recommendations  Follow physician's recommendations for discharge plan and follow up therapies     Assistance Recommended at Discharge Intermittent Supervision/Assistance  Patient can return home with the following A little help with walking and/or transfers;Assistance with cooking/housework;Assist for transportation;A little help with bathing/dressing/bathroom;Help with stairs or ramp for entrance   Equipment Recommendations   (may need youth Rw-family checking)    Recommendations for Other Services       Precautions / Restrictions Precautions Precautions: Knee;Fall Restrictions LLE Weight Bearing: Weight bearing as tolerated     Mobility  Bed Mobility Overal bed mobility: Needs Assistance Bed Mobility: Supine to Sit, Sit to Supine     Supine to sit: Min guard Sit to supine: Min guard   General bed mobility comments: use of belt on the left  foot to self assist leg    Transfers Overall transfer level: Needs assistance Equipment used: Rolling walker (2 wheels) Transfers: Sit to/from Stand Sit to Stand: Min assist           General transfer comment: cues for hand and left leg position., .     Ambulation/Gait Ambulation/Gait assistance: Min assist Gait Distance (Feet): 50 Feet Assistive device: Rolling walker (2 wheels) Gait Pattern/deviations: Step-to pattern, Antalgic, Decreased stance time - left Gait velocity: decr     General Gait Details: multimodal cues for sequence and position insaide Rw.   Stairs             Wheelchair Mobility    Modified Rankin (Stroke Patients Only)       Balance Overall balance assessment: Needs assistance Sitting-balance support: Feet supported, Bilateral upper extremity supported Sitting balance-Leahy Scale: Fair     Standing balance support: During functional activity, Bilateral upper extremity supported, Reliant on assistive device for balance Standing balance-Leahy Scale: Poor                              Cognition Arousal/Alertness: Awake/alert Behavior During Therapy: WFL for tasks assessed/performed Overall Cognitive Status: Within Functional Limits for tasks assessed                                 General Comments: sleepy from meds most likely, perks up when mobilizing        Exercises Total Joint Exercises Ankle Circles/Pumps: AROM, Both, 10 reps Quad Sets: AROM, Both, 10 reps Heel Slides: AAROM, Left, 10 reps Hip ABduction/ADduction: AAROM, Left, 10 reps Straight Leg Raises: AAROM, Left, 10 reps Long Arc Quad: AAROM, 5 reps, Left    General Comments        Pertinent Vitals/Pain Pain Assessment Pain Assessment: 0-10 Pain Score: 5  Pain Location: left knee Pain Descriptors / Indicators: Aching, Discomfort Pain Intervention(s): Monitored during session, Premedicated before session, Ice applied    Home Living Family/patient expects to be discharged to:: Private residence Living Arrangements: Spouse/significant other;Children Available Help at Discharge: Family;Available 24 hours/day Type of Home: House Home Access: Stairs to enter   [employer] of Steps:  1   Home Layout: One level Home Equipment: Conservation officer, nature (2 wheels)      Prior Function            PT Goals (current goals can now be found in the care plan section) Acute Rehab PT Goals Patient Stated Goal: to go home PT Goal Formulation: With patient Time For Goal Achievement: 01/11/22 Potential to Achieve Goals: Good Progress towards PT goals: Progressing toward goals    Frequency    7X/week      PT Plan Current plan remains appropriate    Co-evaluation              AM-PAC PT "6 Clicks" Mobility   Outcome Measure  Help needed turning from your back to your side while in a flat bed without using bedrails?: A Little Help needed moving from lying on your back to sitting on the side of a flat bed without using bedrails?: A Little Help needed moving to and from a bed to a chair (including a wheelchair)?: A Little Help needed standing up from a chair using your arms (e.g., wheelchair or bedside chair)?: A Little Help needed to walk in hospital room?: A Little Help needed climbing 3-5 steps with a railing? : A Lot 6 Click Score: 17    End of Session Equipment Utilized During Treatment: Gait belt Activity Tolerance: Patient tolerated treatment well Patient left: in bed;with call bell/phone within reach;with bed alarm set;with family/visitor present Nurse Communication: Mobility status PT Visit Diagnosis: Unsteadiness on feet (R26.81);Difficulty in walking, not elsewhere classified (R26.2)     Time: 3734-2876 PT Time Calculation (min) (ACUTE ONLY): 24 min  Charges:  $Gait Training: 8-22 mins $Therapeutic Exercise: 8-22 mins                     Tresa Endo PT Acute Rehabilitation Services Pager [phone] Office [phone]    Claretha Cooper 01/04/2022, 4:36 PM

## 2022-01-04 NOTE — Plan of Care (Signed)
  Problem: Coping: Goal: Level of anxiety will decrease Outcome: Progressing   Problem: Pain Managment: Goal: General experience of comfort will improve Outcome: Progressing   

## 2022-01-04 NOTE — TOC Transition Note (Signed)
Transition of Care [facility]) - CM/SW Discharge Note  Patient Details  Name: Bonnie Weeks MRN: [id number] Date of Birth: 09/06/1946  Transition of Care [facility]) CM/SW Contact:  Sherie Don, LCSW Phone Number: 01/04/2022, 9:53 AM  Clinical Narrative: Patient is expected to discharge home after working with PT. CSW met with patient to review discharge plan. Patient will discharge home with OPPT at [facility] with the first appointment scheduled for 01/06/22. Patient has a rolling walker and elevated toilet at home, so there are no DME needs at this time. TOC signing off.  Final next level of care: OP Rehab Barriers to Discharge: No Barriers Identified  Patient Goals and CMS Choice Patient states their goals for this hospitalization and ongoing recovery are:: Go to OPPT at [facility] Choice offered to / list presented to : NA  Discharge Plan and Services         DME Arranged: N/A DME Agency: NA  Readmission Risk Interventions No flowsheet data found.

## 2022-01-04 NOTE — Evaluation (Signed)
Physical Therapy Evaluation Patient Details Name: Bonnie Weeks MRN: [id number] DOB: 06-03-1946 Today's Date: 01/04/2022  History of Present Illness  76 yo s/p L TKA 01/03/22, PMH: back fusion  Clinical Impression  The patient was limited in ambulation due to complaints of left knee pain and nausea. Will return  for second PT and see how she progresses.  Pt admitted with above diagnosis.  Pt currently with functional limitations due to the deficits listed below (see PT Problem List). Pt will benefit from skilled PT to increase their independence and safety with mobility to allow discharge to the venue listed below.          Recommendations for follow up therapy are one component of a multi-disciplinary discharge planning process, led by the attending physician.  Recommendations may be updated based on patient status, additional functional criteria and insurance authorization.  Follow Up Recommendations Follow physician's recommendations for discharge plan and follow up therapies    Assistance Recommended at Discharge Intermittent Supervision/Assistance  Patient can return home with the following  A little help with walking and/or transfers;Assistance with cooking/housework;Assist for transportation;A little help with bathing/dressing/bathroom;Help with stairs or ramp for entrance    Equipment Recommendations None recommended by PT  Recommendations for Other Services       Functional Status Assessment Patient has had a recent decline in their functional status and demonstrates the ability to make significant improvements in function in a reasonable and predictable amount of time.     Precautions / Restrictions Precautions Precautions: Knee Restrictions LLE Weight Bearing: Weight bearing as tolerated      Mobility  Bed Mobility Overal bed mobility: Needs Assistance Bed Mobility: Supine to Sit     Supine to sit: Min assist     General bed mobility comments: support Left leg  to floor    Transfers Overall transfer level: Needs assistance Equipment used: Rolling walker (2 wheels) Transfers: Sit to/from Stand Sit to Stand: Min assist           General transfer comment: cues for hand and left leg position., patient sort of  "blocked" trying to stand up. fian;;y able to rise  from bed.    Ambulation/Gait Ambulation/Gait assistance: Min assist Gait Distance (Feet): 20 Feet Assistive device: Rolling walker (2 wheels) Gait Pattern/deviations: Step-to pattern, Antalgic, Decreased stance time - left Gait velocity: decr     General Gait Details: much extra time to  ambulate a short distance. patient complained of being hot and nauseated and increased pain  Stairs            Wheelchair Mobility    Modified Rankin (Stroke Patients Only)       Balance Overall balance assessment: Needs assistance Sitting-balance support: Feet supported, Bilateral upper extremity supported Sitting balance-Leahy Scale: Fair     Standing balance support: During functional activity, Bilateral upper extremity supported, Reliant on assistive device for balance Standing balance-Leahy Scale: Poor                               Pertinent Vitals/Pain Pain Assessment Pain Assessment: 0-10 Pain Score: 6  Pain Descriptors / Indicators: Aching, Discomfort Pain Intervention(s): Monitored during session, Premedicated before session, Ice applied, Limited activity within patient's tolerance    Home Living Family/patient expects to be discharged to:: Private residence Living Arrangements: Spouse/significant other;Children Available Help at Discharge: Family;Available 24 hours/day Type of Home: House Home Access: Stairs to enter   [employer]  of Steps: 1   Home Layout: One level Home Equipment: Conservation officer, nature (2 wheels)      Prior Function Prior Level of Function : Independent/Modified Independent                     Hand Dominance         Extremity/Trunk Assessment   Upper Extremity Assessment Upper Extremity Assessment: Overall WFL for tasks assessed    Lower Extremity Assessment Lower Extremity Assessment: RLE deficits/detail RLE Deficits / Details: 5-60    Cervical / Trunk Assessment Cervical / Trunk Assessment: Normal  Communication   Communication: No difficulties  Cognition Arousal/Alertness: Awake/alert Behavior During Therapy: WFL for tasks assessed/performed Overall Cognitive Status: Within Functional Limits for tasks assessed                                          General Comments      Exercises Total Joint Exercises Long Arc Quad: AAROM, 5 reps, Left   Assessment/Plan    PT Assessment Patient needs continued PT services  PT Problem List Decreased strength;Decreased mobility;Decreased safety awareness;Decreased range of motion;Decreased knowledge of precautions;Decreased activity tolerance;Decreased knowledge of use of DME;Pain       PT Treatment Interventions DME instruction;Therapeutic activities;Gait training;Therapeutic exercise;Patient/family education;Stair training;Functional mobility training    PT Goals (Current goals can be found in the Care Plan section)  Acute Rehab PT Goals Patient Stated Goal: to go home PT Goal Formulation: With patient Time For Goal Achievement: 01/11/22 Potential to Achieve Goals: Good    Frequency 7X/week     Co-evaluation               AM-PAC PT "6 Clicks" Mobility  Outcome Measure Help needed turning from your back to your side while in a flat bed without using bedrails?: A Little Help needed moving from lying on your back to sitting on the side of a flat bed without using bedrails?: A Little Help needed moving to and from a bed to a chair (including a wheelchair)?: A Little Help needed standing up from a chair using your arms (e.g., wheelchair or bedside chair)?: A Little Help needed to walk in hospital room?: A  Lot Help needed climbing 3-5 steps with a railing? : A Lot 6 Click Score: 16    End of Session Equipment Utilized During Treatment: Gait belt Activity Tolerance: Patient limited by pain;Treatment limited secondary to medical complications (Comment) Patient left: in chair;with call bell/phone within reach;with family/visitor present Nurse Communication: Mobility status PT Visit Diagnosis: Unsteadiness on feet (R26.81);Difficulty in walking, not elsewhere classified (R26.2)    Time: 6286-3817 PT Time Calculation (min) (ACUTE ONLY): 28 min   Charges:   PT Evaluation $PT Eval Low Complexity: 1 Low PT Treatments $Gait Training: 8-22 mins        Tresa Endo PT Acute Rehabilitation Services Pager [phone] Office [phone]   Claretha Cooper 01/04/2022, 1:10 PM

## 2022-01-04 NOTE — Progress Notes (Signed)
° °  Subjective: 1 Day Post-Op Procedure(s) (LRB): TOTAL KNEE ARTHROPLASTY (Left) Patient reports pain as mild.   Patient seen in rounds with Dr. Alvan Dame. Patient is well, and has had no acute complaints or problems. No acute events overnight. Foley catheter removed.  We will start therapy today.   Objective: Vital signs in last 24 hours: Temp:  [96.7 F (35.9 C)-98 F (36.7 C)] 97.6 F (36.4 C) (02/15 0557) Pulse Rate:  [57-85] 61 (02/15 0557) Resp:  [14-18] 16 (02/15 0557) BP: (109-147)/(47-90) 135/69 (02/15 0557) SpO2:  [96 %-100 %] 97 % (02/15 0557) Weight:  [86.2 kg] 86.2 kg (02/14 1049)  Intake/Output from previous day:  Intake/Output Summary (Last 24 hours) at 01/04/2022 0845 Last data filed at 01/04/2022 8756 Gross per 24 hour  Intake 2182.45 ml  Output 1750 ml  Net 432.45 ml     Intake/Output this shift: No intake/output data recorded.  Labs: Recent Labs    01/04/22 0359  HGB 10.1*   Recent Labs    01/04/22 0359  WBC 10.2  RBC 3.58*  HCT 31.1*  PLT 222   Recent Labs    01/04/22 0359  NA 136  K 3.9  CL 101  CO2 27  BUN 13  CREATININE 0.68  GLUCOSE 168*  CALCIUM 8.7*   No results for input(s): LABPT, INR in the last 72 hours.  Exam: General - Patient is Alert and Oriented Extremity - Neurologically intact Sensation intact distally Intact pulses distally Dorsiflexion/Plantar flexion intact Dressing - dressing C/D/I Motor Function - intact, moving foot and toes well on exam.   Past Medical History:  Diagnosis Date   Anemia    Arthritis    Asthma    Back pain    Cancer (HCC)    right breast cancer   Depression    Dyspnea    GERD (gastroesophageal reflux disease)    Headache    History of blood clots    lung   History of hiatal hernia    Hyperlipidemia    Hypertension    Leaky heart valve     Assessment/Plan: 1 Day Post-Op Procedure(s) (LRB): TOTAL KNEE ARTHROPLASTY (Left) Principal Problem:   S/P total knee arthroplasty,  left  Estimated body mass index is 33.66 kg/m as calculated from the following:   Height as of this encounter: 5\' 3"  (1.6 m).   Weight as of this encounter: 86.2 kg. Advance diet Up with therapy D/C IV fluids   Patient's anticipated LOS is less than 2 midnights, meeting these requirements: - Younger than 81 - Lives within 1 hour of care - Has a competent adult at home to recover with post-op recover - NO history of  - Chronic pain requiring opiods  - Diabetes  - Coronary Artery Disease  - Heart failure  - Heart attack  - Stroke  - DVT/VTE  - Cardiac arrhythmia  - Respiratory Failure/COPD  - Renal failure  - Anemia  - Advanced Liver disease     DVT Prophylaxis - Xarelto Weight bearing as tolerated.  Plan is to go Home after hospital stay. Plan for discharge today following 1-2 sessions of PT as long as they are meeting their goals. Patient is scheduled for OPPT. Follow up in the office in 2 weeks.   Griffith Citron, PA-C Orthopedic Surgery [phone] 01/04/2022, 8:45 AM

## 2022-01-05 DIAGNOSIS — J45909 Unspecified asthma, uncomplicated: Secondary | ICD-10-CM | POA: Diagnosis not present

## 2022-01-05 DIAGNOSIS — M1712 Unilateral primary osteoarthritis, left knee: Secondary | ICD-10-CM | POA: Diagnosis not present

## 2022-01-05 DIAGNOSIS — I10 Essential (primary) hypertension: Secondary | ICD-10-CM | POA: Diagnosis not present

## 2022-01-05 DIAGNOSIS — Z79899 Other long term (current) drug therapy: Secondary | ICD-10-CM | POA: Diagnosis not present

## 2022-01-05 DIAGNOSIS — Z853 Personal history of malignant neoplasm of breast: Secondary | ICD-10-CM | POA: Diagnosis not present

## 2022-01-05 LAB — CBC
HCT: 32.6 % — ABNORMAL LOW (ref 36.0–46.0)
Hemoglobin: 10.6 g/dL — ABNORMAL LOW (ref 12.0–15.0)
MCH: 28.2 pg (ref 26.0–34.0)
MCHC: 32.5 g/dL (ref 30.0–36.0)
MCV: 86.7 fL (ref 80.0–100.0)
Platelets: 204 10*3/uL (ref 150–400)
RBC: 3.76 MIL/uL — ABNORMAL LOW (ref 3.87–5.11)
RDW: 15.7 % — ABNORMAL HIGH (ref 11.5–15.5)
WBC: 10.3 10*3/uL (ref 4.0–10.5)
nRBC: 0 % (ref 0.0–0.2)

## 2022-01-05 MED ORDER — CALCIUM CARBONATE ANTACID 500 MG PO CHEW
1.0000 | CHEWABLE_TABLET | Freq: Two times a day (BID) | ORAL | Status: DC | PRN
  Administered 2022-01-05: 200 mg via ORAL
  Filled 2022-01-05: qty 1

## 2022-01-05 NOTE — Plan of Care (Signed)
All discharge instructions were given to the Pt. Discussed pain management. All questions were answered. 

## 2022-01-05 NOTE — Progress Notes (Signed)
Physical Therapy Treatment Patient Details Name: Bonnie Weeks MRN: [id number] DOB: 07/21/1946 Today's Date: 01/05/2022   History of Present Illness 76 yo s/p L TKA 01/03/22, PMH: back fusion    PT Comments    Pt ambulated in hallway and practiced one step.  Pt also performed a couple exercises upon returning to recliner.  Pt would like to practice step once more and then anticipates d/c home later today.    Recommendations for follow up therapy are one component of a multi-disciplinary discharge planning process, led by the attending physician.  Recommendations may be updated based on patient status, additional functional criteria and insurance authorization.  Follow Up Recommendations  Follow physician's recommendations for discharge plan and follow up therapies     Assistance Recommended at Discharge Intermittent Supervision/Assistance  Patient can return home with the following A little help with walking and/or transfers;Assistance with cooking/housework;Assist for transportation;A little help with bathing/dressing/bathroom;Help with stairs or ramp for entrance   Equipment Recommendations  None recommended by PT    Recommendations for Other Services       Precautions / Restrictions Precautions Precautions: Knee;Fall Restrictions LLE Weight Bearing: Weight bearing as tolerated     Mobility  Bed Mobility               General bed mobility comments: pt in recliner on arrival    Transfers Overall transfer level: Needs assistance Equipment used: Rolling walker (2 wheels) Transfers: Sit to/from Stand Sit to Stand: Min guard           General transfer comment: cues for UE and LE positioning for pain control    Ambulation/Gait Ambulation/Gait assistance: Min guard Gait Distance (Feet): 80 Feet Assistive device: Rolling walker (2 wheels) Gait Pattern/deviations: Step-to pattern, Antalgic, Decreased stance time - left Gait velocity: decr     General Gait  Details: cues for sequence, RW positioning, step length, posture   Stairs Stairs: Yes Stairs assistance: Min guard Stair Management: Step to pattern, Forwards, With walker Number of Stairs: 1 General stair comments: performed one step near hand rail, cues for RW positioning as well (pt has handrails and performed sturdy handrail to RW); performed once with cues for technique and safety; pt's daughter and spouse observed   Wheelchair Mobility    Modified Rankin (Stroke Patients Only)       Balance                                            Cognition Arousal/Alertness: Awake/alert Behavior During Therapy: WFL for tasks assessed/performed Overall Cognitive Status: Within Functional Limits for tasks assessed                                          Exercises Total Joint Exercises Ankle Circles/Pumps: AROM, Both, 10 reps Quad Sets: AROM, Both, 10 reps Knee Flexion: AROM, Left, 10 reps, Seated    General Comments        Pertinent Vitals/Pain Pain Assessment Pain Assessment: 0-10 Pain Score: 5  Pain Location: left knee Pain Descriptors / Indicators: Aching, Discomfort Pain Intervention(s): Repositioned, Monitored during session, Ice applied    Home Living  Prior Function            PT Goals (current goals can now be found in the care plan section) Progress towards PT goals: Progressing toward goals    Frequency    7X/week      PT Plan Current plan remains appropriate    Co-evaluation              AM-PAC PT "6 Clicks" Mobility   Outcome Measure  Help needed turning from your back to your side while in a flat bed without using bedrails?: A Little Help needed moving from lying on your back to sitting on the side of a flat bed without using bedrails?: A Little Help needed moving to and from a bed to a chair (including a wheelchair)?: A Little Help needed standing up from a chair  using your arms (e.g., wheelchair or bedside chair)?: A Little Help needed to walk in hospital room?: A Little Help needed climbing 3-5 steps with a railing? : A Little 6 Click Score: 18    End of Session Equipment Utilized During Treatment: Gait belt Activity Tolerance: Patient tolerated treatment well Patient left: with family/visitor present;in chair;with call bell/phone within reach Nurse Communication: Mobility status PT Visit Diagnosis: Unsteadiness on feet (R26.81);Difficulty in walking, not elsewhere classified (R26.2)     Time: 7867-5449 PT Time Calculation (min) (ACUTE ONLY): 30 min  Charges:  $Gait Training: 23-37 mins                    Jannette Spanner PT, DPT Acute Rehabilitation Services Pager: [phone] Office: [place] 01/05/2022, 12:31 PM

## 2022-01-05 NOTE — Progress Notes (Signed)
Physical Therapy Treatment Patient Details Name: Bonnie Weeks MRN: [id number] DOB: 1946/09/04 Today's Date: 01/05/2022   History of Present Illness 76 yo s/p L TKA 01/03/22, PMH: back fusion    PT Comments    Pt ambulated in hallway and practiced one step again with daughter assisting with RW as needed.  Pt and daughter reports understanding.  Pt also performed a couple exercises and reviewed HEP verbally.  Pt provided with HEP and step handouts and plans to f/u with OPPT tomorrow.  Pt feels ready for d/c home today.     Recommendations for follow up therapy are one component of a multi-disciplinary discharge planning process, led by the attending physician.  Recommendations may be updated based on patient status, additional functional criteria and insurance authorization.  Follow Up Recommendations  Follow physician's recommendations for discharge plan and follow up therapies     Assistance Recommended at Discharge Intermittent Supervision/Assistance  Patient can return home with the following A little help with walking and/or transfers;Assistance with cooking/housework;Assist for transportation;A little help with bathing/dressing/bathroom;Help with stairs or ramp for entrance   Equipment Recommendations  None recommended by PT    Recommendations for Other Services       Precautions / Restrictions Precautions Precautions: Knee;Fall Restrictions LLE Weight Bearing: Weight bearing as tolerated     Mobility  Bed Mobility               General bed mobility comments: pt in recliner on arrival    Transfers Overall transfer level: Needs assistance Equipment used: Rolling walker (2 wheels) Transfers: Sit to/from Stand Sit to Stand: Min guard           General transfer comment: cues for UE and LE positioning for pain control    Ambulation/Gait Ambulation/Gait assistance: Min guard Gait Distance (Feet): 60 Feet Assistive device: Rolling walker (2 wheels) Gait  Pattern/deviations: Step-to pattern, Antalgic, Decreased stance time - left Gait velocity: decr     General Gait Details: cues for sequence, RW positioning, step length, posture   Stairs Stairs: Yes Stairs assistance: Min guard Stair Management: Step to pattern, Forwards, With walker Number of Stairs: 1 General stair comments: performed one step near hand rail, cues for RW positioning as well; performed once with cues for technique and safety; pt's daughter assisted with holding/moving RW as needed   Wheelchair Mobility    Modified Rankin (Stroke Patients Only)       Balance                                            Cognition Arousal/Alertness: Awake/alert Behavior During Therapy: WFL for tasks assessed/performed Overall Cognitive Status: Within Functional Limits for tasks assessed                                          Exercises Total Joint Exercises Ankle Circles/Pumps: AROM, Both, 10 reps Quad Sets: AROM, Both, 10 reps Heel Slides: AAROM, Left, 10 reps Hip ABduction/ADduction: AAROM, Left, 10 reps Straight Leg Raises: AAROM, Left, 10 reps Knee Flexion: AROM, Left, 10 reps, Seated    General Comments        Pertinent Vitals/Pain Pain Assessment Pain Assessment: 0-10 Pain Score: 4  Pain Location: left knee Pain Descriptors / Indicators: Aching, Discomfort, Sore Pain Intervention(s):  Repositioned, Monitored during session, Premedicated before session    Home Living                          Prior Function            PT Goals (current goals can now be found in the care plan section) Progress towards PT goals: Progressing toward goals    Frequency    7X/week      PT Plan Current plan remains appropriate    Co-evaluation              AM-PAC PT "6 Clicks" Mobility   Outcome Measure  Help needed turning from your back to your side while in a flat bed without using bedrails?: A Little Help  needed moving from lying on your back to sitting on the side of a flat bed without using bedrails?: A Little Help needed moving to and from a bed to a chair (including a wheelchair)?: A Little Help needed standing up from a chair using your arms (e.g., wheelchair or bedside chair)?: A Little Help needed to walk in hospital room?: A Little Help needed climbing 3-5 steps with a railing? : A Little 6 Click Score: 18    End of Session Equipment Utilized During Treatment: Gait belt Activity Tolerance: Patient tolerated treatment well Patient left: with family/visitor present;in chair;with call bell/phone within reach Nurse Communication: Mobility status PT Visit Diagnosis: Difficulty in walking, not elsewhere classified (R26.2)     Time: 7517-0017 PT Time Calculation (min) (ACUTE ONLY): 22 min  Charges:  $Gait Training: 8-22 mins                    Arlyce Dice, DPT Acute Rehabilitation Services Pager: [phone] Office: [place] 01/05/2022, 4:22 PM

## 2022-01-05 NOTE — Plan of Care (Signed)
  Problem: Coping: Goal: Level of anxiety will decrease Outcome: Progressing   Problem: Pain Managment: Goal: General experience of comfort will improve Outcome: Progressing   

## 2022-01-05 NOTE — Progress Notes (Signed)
° °  Subjective: 2 Days Post-Op Procedure(s) (LRB): TOTAL KNEE ARTHROPLASTY (Left) Patient reports pain as mild.   Patient seen in rounds for Dr. Alvan Dame. Patient is well, and has had no acute complaints or problems. No acute events overnight. Voiding without difficulty, positive flatus. Patient ambulated 50 feet with PT, but was not able to meet all goals to pass for safe d/c home.  We will continue therapy today.   Objective: Vital signs in last 24 hours: Temp:  [98.1 F (36.7 C)-98.3 F (36.8 C)] 98.1 F (36.7 C) (02/16 0623) Pulse Rate:  [72-76] 72 (02/16 0623) Resp:  [16-18] 16 (02/16 0623) BP: (129-138)/(66-74) 137/66 (02/16 0623) SpO2:  [90 %-96 %] 90 % (02/16 0623)  Intake/Output from previous day:  Intake/Output Summary (Last 24 hours) at 01/05/2022 0726 Last data filed at 01/05/2022 7619 Gross per 24 hour  Intake 600 ml  Output 100 ml  Net 500 ml     Intake/Output this shift: No intake/output data recorded.  Labs: Recent Labs    01/04/22 0359 01/05/22 0331  HGB 10.1* 10.6*   Recent Labs    01/04/22 0359 01/05/22 0331  WBC 10.2 10.3  RBC 3.58* 3.76*  HCT 31.1* 32.6*  PLT 222 204   Recent Labs    01/04/22 0359  NA 136  K 3.9  CL 101  CO2 27  BUN 13  CREATININE 0.68  GLUCOSE 168*  CALCIUM 8.7*   No results for input(s): LABPT, INR in the last 72 hours.  Exam: General - Patient is Alert and Oriented Extremity - Neurologically intact Sensation intact distally Intact pulses distally Dorsiflexion/Plantar flexion intact Dressing - dressing C/D/I Motor Function - intact, moving foot and toes well on exam.   Past Medical History:  Diagnosis Date   Anemia    Arthritis    Asthma    Back pain    Cancer (HCC)    right breast cancer   Depression    Dyspnea    GERD (gastroesophageal reflux disease)    Headache    History of blood clots    lung   History of hiatal hernia    Hyperlipidemia    Hypertension    Leaky heart valve      Assessment/Plan: 2 Days Post-Op Procedure(s) (LRB): TOTAL KNEE ARTHROPLASTY (Left) Principal Problem:   S/P total knee arthroplasty, left  Estimated body mass index is 33.66 kg/m as calculated from the following:   Height as of this encounter: 5\' 3"  (1.6 m).   Weight as of this encounter: 86.2 kg. Advance diet Up with therapy D/C IV fluids   Patient's anticipated LOS is less than 2 midnights, meeting these requirements: - Younger than 79 - Lives within 1 hour of care - Has a competent adult at home to recover with post-op recover - NO history of  - Chronic pain requiring opiods  - Diabetes  - Coronary Artery Disease  - Heart failure  - Heart attack  - Stroke  - DVT/VTE  - Cardiac arrhythmia  - Respiratory Failure/COPD  - Renal failure  - Anemia  - Advanced Liver disease     DVT Prophylaxis - Aspirin Weight bearing as tolerated.  Plan is to go Home after hospital stay. Plan for discharge today following 1-2 sessions of PT as long as they are meeting their goals. Patient is scheduled for OPPT. Follow up in the office in 2 weeks.   Griffith Citron, PA-C Orthopedic Surgery [phone] 01/05/2022, 7:26 AM

## 2022-01-09 DIAGNOSIS — M25562 Pain in left knee: Secondary | ICD-10-CM | POA: Diagnosis not present

## 2022-01-09 DIAGNOSIS — Z96652 Presence of left artificial knee joint: Secondary | ICD-10-CM | POA: Diagnosis not present

## 2022-01-11 DIAGNOSIS — Z96652 Presence of left artificial knee joint: Secondary | ICD-10-CM | POA: Diagnosis not present

## 2022-01-11 DIAGNOSIS — M25562 Pain in left knee: Secondary | ICD-10-CM | POA: Diagnosis not present

## 2022-01-13 DIAGNOSIS — Z96652 Presence of left artificial knee joint: Secondary | ICD-10-CM | POA: Diagnosis not present

## 2022-01-13 DIAGNOSIS — M25562 Pain in left knee: Secondary | ICD-10-CM | POA: Diagnosis not present

## 2022-01-16 DIAGNOSIS — M25562 Pain in left knee: Secondary | ICD-10-CM | POA: Diagnosis not present

## 2022-01-16 DIAGNOSIS — Z96652 Presence of left artificial knee joint: Secondary | ICD-10-CM | POA: Diagnosis not present

## 2022-01-18 DIAGNOSIS — Z96652 Presence of left artificial knee joint: Secondary | ICD-10-CM | POA: Diagnosis not present

## 2022-01-18 DIAGNOSIS — M25562 Pain in left knee: Secondary | ICD-10-CM | POA: Diagnosis not present

## 2022-01-18 NOTE — Discharge Summary (Signed)
Patient ID: Bonnie Weeks MRN: [id number] DOB/AGE: 1946-02-05 76 y.o.  Admit date: 01/03/2022 Discharge date: 01/05/2022  Admission Diagnoses:  Left knee osteoarthritis  Discharge Diagnoses:  Principal Problem:   S/P total knee arthroplasty, left   Past Medical History:  Diagnosis Date   Anemia    Arthritis    Asthma    Back pain    Cancer ([place])    right breast cancer   Depression    Dyspnea    GERD (gastroesophageal reflux disease)    Headache    History of blood clots    lung   History of hiatal hernia    Hyperlipidemia    Hypertension    Leaky heart valve     Surgeries: Procedure(s): TOTAL KNEE ARTHROPLASTY on 01/03/2022   Consultants:   Discharged Condition: Improved  Hospital Course: Bonnie Weeks is an 76 y.o. female who was admitted 01/03/2022 for operative treatment ofS/P total knee arthroplasty, left. Patient has severe unremitting pain that affects sleep, daily activities, and work/hobbies. After pre-op clearance the patient was taken to the operating room on 01/03/2022 and underwent  Procedure(s): TOTAL KNEE ARTHROPLASTY.    Patient was given perioperative antibiotics:  Anti-infectives (From admission, onward)    Start     Dose/Rate Route Frequency Ordered Stop   01/03/22 1900  ceFAZolin (ANCEF) IVPB 2g/100 mL premix        2 g 200 mL/hr over 30 Minutes Intravenous Every 6 hours 01/03/22 1618 01/04/22 0130   01/03/22 1030  ceFAZolin (ANCEF) IVPB 2g/100 mL premix        2 g 200 mL/hr over 30 Minutes Intravenous On call to O.R. 01/03/22 1027 01/03/22 1308   01/03/22 1030  vancomycin (VANCOCIN) IVPB 1000 mg/200 mL premix        1,000 mg 200 mL/hr over 60 Minutes Intravenous On call to O.R. 01/03/22 1027 01/03/22 1329   01/03/22 1030  vancomycin (VANCOCIN) IVPB 1000 mg/200 mL premix  Status:  Discontinued        1,000 mg 200 mL/hr over 60 Minutes Intravenous  Once 01/03/22 1027 01/03/22 1030        Patient was given sequential compression devices,  early ambulation, and chemoprophylaxis to prevent DVT. Patient worked with PT and was meeting their goals regarding safe ambulation and transfers.  Patient benefited maximally from hospital stay and there were no complications.    Recent vital signs: No data found.   Recent laboratory studies: No results for input(s): WBC, HGB, HCT, PLT, NA, K, CL, CO2, BUN, CREATININE, GLUCOSE, INR, CALCIUM in the last 72 hours.  Invalid input(s): PT, 2   Discharge Medications:   Allergies as of 01/05/2022       Reactions   Hydromorphone Anaphylaxis   Procaine Other (See Comments)   Headache   Oxycodone Nausea And Vomiting        Medication List     STOP taking these medications    hydrochlorothiazide 25 MG tablet Commonly known as: HYDRODIURIL   HYDROcodone-acetaminophen 10-325 MG tablet Commonly known as: NORCO Replaced by: HYDROcodone-acetaminophen 7.5-325 MG tablet   naproxen sodium 220 MG tablet Commonly known as: ALEVE       TAKE these medications    atorvastatin 10 MG tablet Commonly known as: LIPITOR Take 10 mg by mouth daily.   CALCIUM 600/VITAMIN D PO Take 1 tablet by mouth daily.   celecoxib 200 MG capsule Commonly known as: CELEBREX Take 200 mg by mouth daily.   cetirizine 10 MG tablet  Commonly known as: ZYRTEC Take 10 mg by mouth daily.   denosumab 60 MG/ML Sosy injection Commonly known as: PROLIA Inject 60 mg into the skin every 6 (six) months.   diltiazem 30 MG tablet Commonly known as: CARDIZEM Take 1 tablet (30 mg total) 3 (three) times daily before meals by mouth. What changed:  when to take this reasons to take this   docusate sodium 100 MG capsule Commonly known as: COLACE Take 1 capsule (100 mg total) by mouth 2 (two) times daily.   EpiPen 2-Pak 0.3 mg/0.3 mL Soaj injection Generic drug: EPINEPHrine Inject 0.3 mg into the muscle daily as needed (for allergic reaction).   Gemtesa 75 MG Tabs Generic drug: Vibegron Take 75 mg by mouth  daily.   HYDROcodone-acetaminophen 7.5-325 MG tablet Commonly known as: NORCO Take 1-2 tablets by mouth every 4 (four) hours as needed for severe pain. Replaces: HYDROcodone-acetaminophen 10-325 MG tablet   losartan-hydrochlorothiazide 50-12.5 MG tablet Commonly known as: HYZAAR Take 1 tablet by mouth daily.   methocarbamol 500 MG tablet Commonly known as: ROBAXIN Take 1 tablet (500 mg total) by mouth every 6 (six) hours as needed for muscle spasms.   montelukast 10 MG tablet Commonly known as: SINGULAIR Take 10 mg by mouth daily.   multivitamin with minerals tablet Take 1 tablet by mouth daily.   omeprazole 40 MG capsule Commonly known as: PRILOSEC Take 40 mg by mouth daily.   omeprazole-sodium bicarbonate 40-1100 MG capsule Commonly known as: ZEGERID Take 1 capsule by mouth 2 (two) times daily as needed (for acid reflux.).   polyethylene glycol 17 g packet Commonly known as: MIRALAX / GLYCOLAX Take 17 g by mouth daily as needed for mild constipation.   rivaroxaban 20 MG Tabs tablet Commonly known as: XARELTO Take 20 mg by mouth daily.   sertraline 100 MG tablet Commonly known as: ZOLOFT Take 150 mg by mouth daily.   SUMAtriptan 6 MG/0.5ML Soaj Inject 6 mg into the skin daily as needed (For migraine).               Discharge Care Instructions  (From admission, onward)           Start     Ordered   01/04/22 0000  Change dressing       Comments: Maintain surgical dressing until follow up in the clinic. If the edges start to pull up, may reinforce with tape. If the dressing is no longer working, may remove and cover with gauze and tape, but must keep the area dry and clean.  Call with any questions or concerns.   01/04/22 0850            Diagnostic Studies: No results found.  Disposition: Discharge disposition: 01-Home or Self Care       Discharge Instructions     Call MD / Call 911   Complete by: As directed    If you experience chest  pain or shortness of breath, CALL 911 and be transported to the hospital emergency room.  If you develope a fever above 101 F, pus (white drainage) or increased drainage or redness at the wound, or calf pain, call your surgeon's office.   Change dressing   Complete by: As directed    Maintain surgical dressing until follow up in the clinic. If the edges start to pull up, may reinforce with tape. If the dressing is no longer working, may remove and cover with gauze and tape, but must keep the area dry  and clean.  Call with any questions or concerns.   Constipation Prevention   Complete by: As directed    Drink plenty of fluids.  Prune juice may be helpful.  You may use a stool softener, such as Colace (over the counter) 100 mg twice a day.  Use MiraLax (over the counter) for constipation as needed.   Diet - low sodium heart healthy   Complete by: As directed    Increase activity slowly as tolerated   Complete by: As directed    Weight bearing as tolerated with assist device (walker, cane, etc) as directed, use it as long as suggested by your surgeon or therapist, typically at least 4-6 weeks.   Post-operative opioid taper instructions:   Complete by: As directed    POST-OPERATIVE OPIOID TAPER INSTRUCTIONS: It is important to wean off of your opioid medication as soon as possible. If you do not need pain medication after your surgery it is ok to stop day one. Opioids include: Codeine, Hydrocodone(Norco, Vicodin), Oxycodone(Percocet, oxycontin) and hydromorphone amongst others.  Long term and even short term use of opiods can cause: Increased pain response Dependence Constipation Depression Respiratory depression And more.  Withdrawal symptoms can include Flu like symptoms Nausea, vomiting And more Techniques to manage these symptoms Hydrate well Eat regular healthy meals Stay active Use relaxation techniques(deep breathing, meditating, yoga) Do Not substitute Alcohol to help with  tapering If you have been on opioids for less than two weeks and do not have pain than it is ok to stop all together.  Plan to wean off of opioids This plan should start within one week post op of your joint replacement. Maintain the same interval or time between taking each dose and first decrease the dose.  Cut the total daily intake of opioids by one tablet each day Next start to increase the time between doses. The last dose that should be eliminated is the evening dose.      TED hose   Complete by: As directed    Use stockings (TED hose) for 2 weeks on both leg(s).  You may remove them at night for sleeping.        Follow-up Information     Paralee Cancel, MD. Go on 01/18/2022.   Specialty: Orthopedic Surgery Why: You are scheduled for first post op appointment on Wednesday March 1st at 9:45am. Contact information: [address] [place] [address] [phone]                  Signed: Irving Copas 01/18/2022, 7:41 AM

## 2022-01-19 ENCOUNTER — Ambulatory Visit (INDEPENDENT_AMBULATORY_CARE_PROVIDER_SITE_OTHER): Payer: Medicare Other | Admitting: Gastroenterology

## 2022-01-19 DIAGNOSIS — M25562 Pain in left knee: Secondary | ICD-10-CM | POA: Diagnosis not present

## 2022-01-19 DIAGNOSIS — C50919 Malignant neoplasm of unspecified site of unspecified female breast: Secondary | ICD-10-CM | POA: Diagnosis not present

## 2022-01-19 DIAGNOSIS — M1712 Unilateral primary osteoarthritis, left knee: Secondary | ICD-10-CM | POA: Diagnosis not present

## 2022-01-19 DIAGNOSIS — Z791 Long term (current) use of non-steroidal anti-inflammatories (NSAID): Secondary | ICD-10-CM | POA: Diagnosis not present

## 2022-01-19 DIAGNOSIS — Z471 Aftercare following joint replacement surgery: Secondary | ICD-10-CM | POA: Diagnosis not present

## 2022-01-19 DIAGNOSIS — Z79899 Other long term (current) drug therapy: Secondary | ICD-10-CM | POA: Diagnosis not present

## 2022-01-19 DIAGNOSIS — Z96652 Presence of left artificial knee joint: Secondary | ICD-10-CM | POA: Diagnosis not present

## 2022-01-19 DIAGNOSIS — Z7901 Long term (current) use of anticoagulants: Secondary | ICD-10-CM | POA: Diagnosis not present

## 2022-01-19 DIAGNOSIS — I890 Lymphedema, not elsewhere classified: Secondary | ICD-10-CM | POA: Diagnosis not present

## 2022-01-20 DIAGNOSIS — M25562 Pain in left knee: Secondary | ICD-10-CM | POA: Diagnosis not present

## 2022-01-20 DIAGNOSIS — Z96652 Presence of left artificial knee joint: Secondary | ICD-10-CM | POA: Diagnosis not present

## 2022-01-26 DIAGNOSIS — Z96652 Presence of left artificial knee joint: Secondary | ICD-10-CM | POA: Diagnosis not present

## 2022-01-26 DIAGNOSIS — M25562 Pain in left knee: Secondary | ICD-10-CM | POA: Diagnosis not present

## 2022-01-30 DIAGNOSIS — M25562 Pain in left knee: Secondary | ICD-10-CM | POA: Diagnosis not present

## 2022-01-30 DIAGNOSIS — Z96652 Presence of left artificial knee joint: Secondary | ICD-10-CM | POA: Diagnosis not present

## 2022-02-01 ENCOUNTER — Other Ambulatory Visit: Payer: Self-pay | Admitting: Neurological Surgery

## 2022-02-01 ENCOUNTER — Other Ambulatory Visit: Payer: Self-pay | Admitting: Family Medicine

## 2022-02-01 DIAGNOSIS — M5416 Radiculopathy, lumbar region: Secondary | ICD-10-CM

## 2022-02-02 DIAGNOSIS — Z96652 Presence of left artificial knee joint: Secondary | ICD-10-CM | POA: Diagnosis not present

## 2022-02-02 DIAGNOSIS — M25562 Pain in left knee: Secondary | ICD-10-CM | POA: Diagnosis not present

## 2022-02-06 ENCOUNTER — Ambulatory Visit
Admission: RE | Admit: 2022-02-06 | Discharge: 2022-02-06 | Disposition: A | Discharge status: Home / Self Care | Payer: Medicare PPO | Source: Ambulatory Visit | Attending: Neurological Surgery | Admitting: Neurological Surgery

## 2022-02-06 ENCOUNTER — Other Ambulatory Visit: Payer: Self-pay | Admitting: Neurological Surgery

## 2022-02-06 DIAGNOSIS — M4327 Fusion of spine, lumbosacral region: Secondary | ICD-10-CM | POA: Diagnosis not present

## 2022-02-06 DIAGNOSIS — M5416 Radiculopathy, lumbar region: Secondary | ICD-10-CM

## 2022-02-06 DIAGNOSIS — M5417 Radiculopathy, lumbosacral region: Secondary | ICD-10-CM | POA: Diagnosis not present

## 2022-02-06 MED ORDER — IOPAMIDOL (ISOVUE-M 200) INJECTION 41%
1.0000 mL | Freq: Once | INTRAMUSCULAR | Status: AC
  Administered 2022-02-06: 1 mL via EPIDURAL

## 2022-02-06 MED ORDER — METHYLPREDNISOLONE ACETATE 40 MG/ML INJ SUSP (RADIOLOG
80.0000 mg | Freq: Once | INTRAMUSCULAR | Status: AC
  Administered 2022-02-06: 80 mg via EPIDURAL

## 2022-02-06 NOTE — Discharge Instructions (Signed)

## 2022-02-09 DIAGNOSIS — Z96652 Presence of left artificial knee joint: Secondary | ICD-10-CM | POA: Diagnosis not present

## 2022-02-09 DIAGNOSIS — M25562 Pain in left knee: Secondary | ICD-10-CM | POA: Diagnosis not present

## 2022-02-13 DIAGNOSIS — M25562 Pain in left knee: Secondary | ICD-10-CM | POA: Diagnosis not present

## 2022-02-13 DIAGNOSIS — Z96652 Presence of left artificial knee joint: Secondary | ICD-10-CM | POA: Diagnosis not present

## 2022-02-14 NOTE — Progress Notes (Addendum)
COVID Vaccine Completed: yes x2 ?Date COVID Vaccine completed: ?Has received booster: ?COVID vaccine manufacturer: [place]  ? ?Date of COVID positive in last 90 days: no ? ?PCP - Judd Lien, MD ?Cardiologist - [facility] in [place] ? ?Chest x-ray - n/a ?EKG - in Nov cards, req ?Stress Test - few years ago ?ECHO - yes, within few years ?Cardiac Cath - n/a ?Pacemaker/ICD device last checked: n/a ?Spinal Cord Stimulator: n/a ? ?Bowel Prep - no ? ?Sleep Study - n/a ?CPAP -  ? ?Fasting Blood Sugar - n/a ?Checks Blood Sugar _____ times a day ? ?Blood Thinner Instructions: Xarelto, hold 7 days ?Aspirin Instructions: ?Last Dose: 02/14/22 0700 ? ?Activity level: Can go up a flight of stairs and perform activities of daily living without stopping and without symptoms of chest pain. SOB with exertion, pt has asthma ?   ? ?Anesthesia review: esophageal dysphagia, HTN, leaky heart valve, cancer, anemia, PE ? ?Patient denies shortness of breath, fever, cough and chest pain at PAT appointment ? ? ?Patient verbalized understanding of instructions that were given to them at the PAT appointment. Patient was also instructed that they will need to review over the PAT instructions again at home before surgery.  ?

## 2022-02-14 NOTE — Patient Instructions (Addendum)
DUE TO COVID-19 ONLY ONE VISITOR  (aged 74 and older)  IS ALLOWED TO COME WITH YOU AND STAY IN THE WAITING ROOM ONLY DURING PRE OP AND PROCEDURE.   ?**NO VISITORS ARE ALLOWED IN THE SHORT STAY AREA OR RECOVERY ROOM!!** ? ?IF YOU WILL BE ADMITTED INTO THE HOSPITAL YOU ARE ALLOWED ONLY TWO SUPPORT PEOPLE DURING VISITATION HOURS ONLY (7 AM -8PM)   ?The support person(s) must pass our screening, gel in and out, and wear a mask at all times, including in the patient?s room. ?Patients must also wear a mask when staff or their support person are in the room. ?Visitors GUEST BADGE MUST BE WORN VISIBLY  ?One adult visitor may remain with you overnight and MUST be in the room by 8 P.M. ?  ? ? Your procedure is scheduled on: 02/21/22 ? ? Report to [facility] Main Entrance ? ?  Report to admitting at 5:15 AM ? ? Call this number if you have problems the morning of surgery [phone] ? ? Do not eat food :After Midnight. ? ? After Midnight you may have the following liquids until 4:15 AM DAY OF SURGERY ? ?Water ?Black Coffee (sugar ok, NO MILK/CREAM OR CREAMERS)  ?Tea (sugar ok, NO MILK/CREAM OR CREAMERS) regular and decaf                             ?Plain Jell-O (NO RED)                                           ?Fruit ices (not with fruit pulp, NO RED)                                     ?Popsicles (NO RED)                                                                  ?Juice: apple, WHITE grape, WHITE cranberry ?Sports drinks like Gatorade (NO RED) ?Clear broth(vegetable,chicken,beef) ?  ?The day of surgery:  ?Drink ONE (1) Pre-Surgery Clear Ensure at 4:15 AM the morning of surgery. Drink in one sitting. Do not sip.  ?This drink was given to you during your hospital  ?pre-op appointment visit. ?Nothing else to drink after completing the  ?Pre-Surgery Clear Ensure. ?  ?       If you have questions, please contact your surgeon?s office. ? ? ?FOLLOW BOWEL PREP AND ANY ADDITIONAL PRE OP INSTRUCTIONS YOU RECEIVED FROM  YOUR SURGEON'S OFFICE!!! ?  ?  ?Oral Hygiene is also important to reduce your risk of infection.                                    ?Remember - BRUSH YOUR TEETH THE MORNING OF SURGERY WITH YOUR REGULAR TOOTHPASTE ? ? Take these medicines the morning of surgery with A SIP OF WATER: Atorvastatin, Zyrtec, Diltiazem, Fluoxetine, Norco, Omeprazole.  ? ?These are anesthesia recommendations for holding your  anticoagulants.  Please contact your prescribing physician to confirm IF it is safe to hold your anticoagulants for this length of time. ? ? ?Eliquis Apixaban   72 hours   ?Xarelto Rivaroxaban   72 hours  ?Plavix Clopidogrel   120 hours  ?Pletal Cilostazol   120 hours  ?               ?           You may not have any metal on your body including hair pins, jewelry, and body piercing ? ?           Do not wear make-up, lotions, powders, perfumes, or deodorant ? ?Do not wear nail polish including gel and S&S, artificial/acrylic nails, or any other type of covering on natural nails including finger and toenails. If you have artificial nails, gel coating, etc. that needs to be removed by a nail salon please have this removed prior to surgery or surgery may need to be canceled/ delayed if the surgeon/ anesthesia feels like they are unable to be safely monitored.  ? ?Do not shave  48 hours prior to surgery.  ? ? Do not bring valuables to the hospital. [facility] NOT ?            RESPONSIBLE   FOR VALUABLES. ? ? Contacts, dentures or bridgework may not be worn into surgery. ? ? Bring small overnight bag day of surgery. ?  ? Special Instructions: Bring a copy of your healthcare power of attorney and living will documents         the day of surgery if you haven't scanned them before. ? ?            Please read over the following fact sheets you were given: IF [place] [phone]- Apolonio Schneiders ? ?   [place] - Preparing for Surgery ?Before surgery, you can play an important  role.  Because skin is not sterile, your skin needs to be as free of germs as possible.  You can reduce the number of germs on your skin by washing with CHG (chlorahexidine gluconate) soap before surgery.  CHG is an antiseptic cleaner which kills germs and bonds with the skin to continue killing germs even after washing. ?Please DO NOT use if you have an allergy to CHG or antibacterial soaps.  If your skin becomes reddened/irritated stop using the CHG and inform your nurse when you arrive at Short Stay. ?Do not shave (including legs and underarms) for at least 48 hours prior to the first CHG shower.  You may shave your face/neck. ? ?Please follow these instructions carefully: ? 1.  Shower with CHG Soap the night before surgery and the  morning of surgery. ? 2.  If you choose to wash your hair, wash your hair first as usual with your normal  shampoo. ? 3.  After you shampoo, rinse your hair and body thoroughly to remove the shampoo.                            ? 4.  Use CHG as you would any other liquid soap.  You can apply chg directly to the skin and wash.  Gently with a scrungie or clean washcloth. ? 5.  Apply the CHG Soap to your body ONLY FROM THE NECK DOWN.   Do   not use on face/ open      ?  Wound or open sores. Avoid contact with eyes, ears mouth and   genitals (private parts).  ?                     Production manager,  Genitals (private parts) with your normal soap. ?            6.  Wash thoroughly, paying special attention to the area where your    surgery  will be performed. ? 7.  Thoroughly rinse your body with warm water from the neck down. ? 8.  DO NOT shower/wash with your normal soap after using and rinsing off the CHG Soap. ?               9.  Pat yourself dry with a clean towel. ?           10.  Wear clean pajamas. ?           11.  Place clean sheets on your bed the night of your first shower and do not  sleep with pets. ?Day of Surgery : ?Do not apply any lotions/deodorants the morning  of surgery.  Please wear clean clothes to the hospital/surgery center. ? ?FAILURE TO FOLLOW THESE INSTRUCTIONS MAY RESULT IN THE CANCELLATION OF YOUR SURGERY ? ?PATIENT SIGNATURE_________________________________ ? ?NURSE SIGNATURE__________________________________ ? ?________________________________________________________________________  ? ?Incentive Spirometer ? ?An incentive spirometer is a tool that can help keep your lungs clear and active. This tool measures how well you are filling your lungs with each breath. Taking long deep breaths may help reverse or decrease the chance of developing breathing (pulmonary) problems (especially infection) following: ?A long period of time when you are unable to move or be active. ?BEFORE THE PROCEDURE  ?If the spirometer includes an indicator to show your best effort, your nurse or respiratory therapist will set it to a desired goal. ?If possible, sit up straight or lean slightly forward. Try not to slouch. ?Hold the incentive spirometer in an upright position. ?INSTRUCTIONS FOR USE  ?Sit on the edge of your bed if possible, or sit up as far as you can in bed or on a chair. ?Hold the incentive spirometer in an upright position. ?Breathe out normally. ?Place the mouthpiece in your mouth and seal your lips tightly around it. ?Breathe in slowly and as deeply as possible, raising the piston or the ball toward the top of the column. ?Hold your breath for 3-5 seconds or for as long as possible. Allow the piston or ball to fall to the bottom of the column. ?Remove the mouthpiece from your mouth and breathe out normally. ?Rest for a few seconds and repeat Steps 1 through 7 at least 10 times every 1-2 hours when you are awake. Take your time and take a few normal breaths between deep breaths. ?The spirometer may include an indicator to show your best effort. Use the indicator as a goal to work toward during each repetition. ?After each set of 10 deep breaths, practice coughing to  be sure your lungs are clear. If you have an incision (the cut made at the time of surgery), support your incision when coughing by placing a pillow or rolled up towels firmly against it. ?Once you are a

## 2022-02-15 ENCOUNTER — Encounter (HOSPITAL_COMMUNITY): Payer: Self-pay

## 2022-02-15 ENCOUNTER — Encounter (HOSPITAL_COMMUNITY)
Admission: RE | Admit: 2022-02-15 | Discharge: 2022-02-15 | Disposition: A | Discharge status: Home / Self Care | Payer: Medicare PPO | Source: Ambulatory Visit | Attending: Orthopedic Surgery | Admitting: Orthopedic Surgery

## 2022-02-15 VITALS — BP 150/76 | HR 75 | Temp 98.2°F | Resp 16 | Ht 63.0 in | Wt 185.2 lb

## 2022-02-15 DIAGNOSIS — I350 Nonrheumatic aortic (valve) stenosis: Secondary | ICD-10-CM | POA: Insufficient documentation

## 2022-02-15 DIAGNOSIS — M1711 Unilateral primary osteoarthritis, right knee: Secondary | ICD-10-CM | POA: Diagnosis not present

## 2022-02-15 DIAGNOSIS — Z01812 Encounter for preprocedural laboratory examination: Secondary | ICD-10-CM | POA: Diagnosis not present

## 2022-02-15 DIAGNOSIS — I10 Essential (primary) hypertension: Secondary | ICD-10-CM | POA: Diagnosis not present

## 2022-02-15 DIAGNOSIS — Z01818 Encounter for other preprocedural examination: Secondary | ICD-10-CM

## 2022-02-15 DIAGNOSIS — Z7901 Long term (current) use of anticoagulants: Secondary | ICD-10-CM | POA: Insufficient documentation

## 2022-02-15 HISTORY — DX: Pneumonia, unspecified organism: J18.9

## 2022-02-15 HISTORY — DX: Nonrheumatic aortic (valve) stenosis: I35.0

## 2022-02-15 HISTORY — DX: Other pulmonary embolism without acute cor pulmonale: I26.99

## 2022-02-15 LAB — COMPREHENSIVE METABOLIC PANEL
ALT: 12 U/L (ref 0–44)
AST: 17 U/L (ref 15–41)
Albumin: 4.3 g/dL (ref 3.5–5.0)
Alkaline Phosphatase: 39 U/L (ref 38–126)
Anion gap: 9 (ref 5–15)
BUN: 17 mg/dL (ref 8–23)
CO2: 28 mmol/L (ref 22–32)
Calcium: 9.4 mg/dL (ref 8.9–10.3)
Chloride: 103 mmol/L (ref 98–111)
Creatinine, Ser: 0.64 mg/dL (ref 0.44–1.00)
GFR, Estimated: >60 mL/min (ref >60)
Glucose, Bld: 91 mg/dL (ref 70–99)
Potassium: 3.6 mmol/L (ref 3.5–5.1)
Sodium: 140 mmol/L (ref 135–145)
Total Bilirubin: 0.3 mg/dL (ref 0.3–1.2)
Total Protein: 7.1 g/dL (ref 6.5–8.1)

## 2022-02-15 LAB — CBC
HCT: 33.6 % — ABNORMAL LOW (ref 36.0–46.0)
Hemoglobin: 10.7 g/dL — ABNORMAL LOW (ref 12.0–15.0)
MCH: 27.6 pg (ref 26.0–34.0)
MCHC: 31.8 g/dL (ref 30.0–36.0)
MCV: 86.8 fL (ref 80.0–100.0)
Platelets: 326 10*3/uL (ref 150–400)
RBC: 3.87 MIL/uL (ref 3.87–5.11)
RDW: 14.8 % (ref 11.5–15.5)
WBC: 5.5 10*3/uL (ref 4.0–10.5)
nRBC: 0 % (ref 0.0–0.2)

## 2022-02-15 LAB — SURGICAL PCR SCREEN
MRSA, PCR: POSITIVE — AB
Staphylococcus aureus: POSITIVE — AB

## 2022-02-16 NOTE — Progress Notes (Signed)
PCR positive for STAPH and MRSA. Results routed to Dr. Alvan Dame. ?

## 2022-02-17 ENCOUNTER — Encounter (HOSPITAL_COMMUNITY): Payer: Self-pay

## 2022-02-17 NOTE — Anesthesia Preprocedure Evaluation (Deleted)
Anesthesia Evaluation  ? ? ?Airway ?Mallampati: II ? ?TM Distance: >3 FB ?Neck ROM: Full ? ? ? Dental ?no notable dental hx. ? ?  ?Pulmonary ?asthma ,  ?  ?Pulmonary exam normal ?breath sounds clear to auscultation ? ? ? ? ? ? Cardiovascular ?hypertension, Pt. on medications ?Normal cardiovascular exam ?Rhythm:Regular Rate:Normal ? ? ?  ?Neuro/Psych ? Headaches, Depression   ? GI/Hepatic ?GERD  ,  ?Endo/Other  ? ? Renal/GU ?  ? ?  ?Musculoskeletal ? ? Abdominal ?(+) + obese,   ?Peds ? Hematology ?  ?Anesthesia Other Findings ? ? Reproductive/Obstetrics ? ?  ? ? ? ? ? ? ? ? ? ? ? ? ? ?  ?  ? ? ? ? ? ? ?                                  Anesthesia Evaluation  ?Patient identified by MRN, date of birth, ID band ?Patient awake ? ? ? ?Reviewed: ?Allergy & Precautions, NPO status , Patient's Chart, lab work & pertinent test results ? ?Airway ?Mallampati: II ? ?TM Distance: >3 FB ?Neck ROM: Limited ? ? ? Dental ?no notable dental hx. ? ?  ?Pulmonary ?PE ?  ?Pulmonary exam normal ?breath sounds clear to auscultation ? ? ? ? ? ? Cardiovascular ?hypertension, Pt. on medications ?Normal cardiovascular exam ?Rhythm:Regular Rate:Normal ? ? ?  ?Neuro/Psych ?negative neurological ROS ? negative psych ROS  ? GI/Hepatic ?Neg liver ROS, GERD  Medicated,  ?Endo/Other  ?negative endocrine ROS ? Renal/GU ?negative Renal ROS  ?negative genitourinary ?  ?Musculoskeletal ?Significant scoliosis ?L2-S1 fusion  ? Abdominal ?  ?Peds ?negative pediatric ROS ?(+)  Hematology ?negative hematology ROS ?(+)   ?Anesthesia Other Findings ? ? Reproductive/Obstetrics ?negative OB ROS ? ?  ? ? ? ? ? ? ? ? ? ? ? ? ? ?  ?  ? ? ? ? ? ? ? ? ?Anesthesia Physical ?Anesthesia Plan ? ?ASA: 3 ? ?Anesthesia Plan: Spinal  ? ?Post-op Pain Management: Regional block*  ? ?Induction: Intravenous ? ?PONV Risk Score and Plan: 2 and Ondansetron, Dexamethasone, Propofol infusion and Treatment may vary due to age or medical  condition ? ?Airway Management Planned: Simple Face Mask ? ?Additional Equipment:  ? ?Intra-op Plan:  ? ?Post-operative Plan:  ? ?Informed Consent: I have reviewed the patients History and Physical, chart, labs and discussed the procedure including the risks, benefits and alternatives for the proposed anesthesia with the patient or authorized representative who has indicated his/her understanding and acceptance.  ? ? ? ?Dental advisory given ? ?Plan Discussed with: CRNA and Surgeon ? ?Anesthesia Plan Comments:   ? ? ? ? ? ? ?Anesthesia Quick Evaluation ? ?Anesthesia Physical ?Anesthesia Plan ? ?ASA: 2 ? ?Anesthesia Plan: Spinal  ? ?Post-op Pain Management: Regional block*  ? ?Induction: Intravenous ? ?PONV Risk Score and Plan: 2 and Ondansetron, Midazolam and Treatment may vary due to age or medical condition ? ?Airway Management Planned: Simple Face Mask ? ?Additional Equipment:  ? ?Intra-op Plan:  ? ?Post-operative Plan:  ? ?Informed Consent: I have reviewed the patients History and Physical, chart, labs and discussed the procedure including the risks, benefits and alternatives for the proposed anesthesia with the patient or authorized representative who has indicated his/her understanding and acceptance.  ? ? ? ?Dental advisory given ? ?Plan Discussed with: CRNA ? ?Anesthesia Plan Comments: (See APP note by A. Jonah Blue,  FNP )  ? ? ? ? ? ?Anesthesia Quick Evaluation ? ?

## 2022-02-17 NOTE — Progress Notes (Signed)
Anesthesia Chart Review: ? ? Case: [id number] Date/Time: 02/21/22 0700  ? Procedure: TOTAL KNEE ARTHROPLASTY (Right: Knee)  ? Anesthesia type: Spinal  ? Pre-op diagnosis: Right knee osteoarthritis  ? Location: [facility] ROOM 09 / [facility]  ? Surgeons: Paralee Cancel, MD  ? ?  ? ? 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 ?Pt reports she was told to hold xarelto 7 days before surgery, last dose 02/14/22 ? ?VS: BP (!) 150/76   Pulse 75   Temp 36.8 ?C (Oral)   Resp 16   Ht '5\' 3"'$  (1.6 m)   Wt 84 kg   SpO2 94%   BMI 32.81 kg/m?  ? ?PROVIDERS: ?- PCP is Burdine, Virgina Evener, MD ?- Cardiologist is Donn Pierini, MD ? ? ?LABS: Labs reviewed: Acceptable for surgery. ?(all labs ordered are listed, but only abnormal results are displayed) ? ?Labs Reviewed  ?SURGICAL PCR SCREEN - Abnormal; Notable for the following components:  ?    Result Value  ? MRSA, PCR POSITIVE (*)   ? Staphylococcus aureus POSITIVE (*)   ? All other components within normal limits  ?CBC - Abnormal; Notable for the following components:  ? Hemoglobin 10.7 (*)   ? HCT 33.6 (*)   ? All other components within normal limits  ?COMPREHENSIVE METABOLIC PANEL  ?TYPE AND SCREEN  ? ? ?EKG 10/10/21 (Dr. Everitt Amber office): sinus rhythm - possible anterior ischemia ? ? ?CV: ?Echo 06/27/2021 (Dr. Everitt Amber office): ?1.  Normal LV size with normal systolic function.  EF 60-65%.  Grade 1 diastolic dysfunction.  Normal LV wall thickness. ?2.  Normal RV size with normal function. ?3.  Abnormal, trileaflet aortic valve.  There is mild aortic stenosis.  The aortic valve is thickened and calcified. ?4.  Mild mitral regurgitation. ?5.  Mild tricuspid regurgitation. ?6.  Normal pulmonary artery systolic pressure (PASP equals 27 mmHg) ?7.  Mild pulmonic regurgitation. ? ? ?Past Medical History:  ?Diagnosis Date  ? Anemia   ? Aortic stenosis   ? mild by 06/27/21 echo  ? Arthritis    ? Asthma   ? Back pain   ? Cancer [facility])   ? right breast cancer  ? Depression   ? Dyspnea   ? GERD (gastroesophageal reflux disease)   ? Headache   ? History of hiatal hernia   ? Hyperlipidemia   ? Hypertension   ? Pneumonia   ? 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 ? ?Past Surgical History:  ?Procedure Laterality Date  ? ANTERIOR CERVICAL DECOMP/DISCECTOMY FUSION  11/2014  ? "[facility]"  ? ANTERIOR LAT LUMBAR FUSION Left 11/09/2015  ? Procedure: Lumbar two-three,lumbar three-four.lumbar four-five Anterior Lateral Lumbar Fusion;  Surgeon: Erline Levine, MD;  Location: [facility];  Service: Neurosurgery;  Laterality: Left;  ? BACK SURGERY    ? BLADDER REPAIR    ? BREAST LUMPECTOMY Right   ? BUNIONECTOMY Bilateral   ? COLONOSCOPY    ? COLONOSCOPY WITH PROPOFOL N/A 12/01/2016  ? Procedure: COLONOSCOPY WITH PROPOFOL;  Surgeon: Rogene Houston, MD;  [facility];  Service: Endoscopy;  Laterality: N/A;  7:30  ? ESOPHAGEAL DILATION N/A 12/01/2016  ? Procedure: ESOPHAGEAL DILATION;  Surgeon: Rogene Houston, MD;  [facility];  Service: Endoscopy;  Laterality: N/A;  ? ESOPHAGOGASTRODUODENOSCOPY (EGD) WITH PROPOFOL N/A 12/01/2016  ?  Procedure: ESOPHAGOGASTRODUODENOSCOPY (EGD) WITH PROPOFOL;  Surgeon: Rogene Houston, MD;  [facility];  Service: Endoscopy;  Laterality: N/A;  ? HERNIA REPAIR    ? hiatal hernia repair x3  ? Hiatal hernia repair    ? NASAL SINUS SURGERY    ? POLYPECTOMY  12/01/2016  ? Procedure: POLYPECTOMY;  Surgeon: Rogene Houston, MD;  [facility];  Service: Endoscopy;;  cecal polypectomy via cold biopsy  ? POSTERIOR LUMBAR FUSION  11/09/2015  ? L5-S1 with Pedicle Screws L2-LUMBAR SACRAL ONE/NOTES 11/09/2015  ? TONSILLECTOMY    ? TOTAL KNEE ARTHROPLASTY Left 01/03/2022  ? Procedure: TOTAL KNEE ARTHROPLASTY;  Surgeon: Paralee Cancel, MD;  Location[facility];  Service: Orthopedics;  Laterality: Left;  ? ? ?MEDICATIONS: ?  atorvastatin (LIPITOR) 10 MG tablet  ? Calcium Carb-Cholecalciferol (CALCIUM 600/VITAMIN D PO)  ? celecoxib (CELEBREX) 200 MG capsule  ? cetirizine (ZYRTEC) 10 MG tablet  ? denosumab (PROLIA) 60 MG/ML SOSY injection  ? diltiazem (CARDIZEM) 30 MG tablet  ? docusate sodium (COLACE) 100 MG capsule  ? FLUoxetine (PROZAC) 40 MG capsule  ? hydrochlorothiazide (HYDRODIURIL) 25 MG tablet  ? HYDROcodone-acetaminophen (NORCO) 7.5-325 MG tablet  ? losartan-hydrochlorothiazide (HYZAAR) 50-12.5 MG tablet  ? methocarbamol (ROBAXIN) 500 MG tablet  ? montelukast (SINGULAIR) 10 MG tablet  ? Multiple Vitamins-Minerals (MULTIVITAMIN WITH MINERALS) tablet  ? omeprazole (PRILOSEC) 40 MG capsule  ? omeprazole-sodium bicarbonate (ZEGERID) 40-1100 MG capsule  ? polyethylene glycol (MIRALAX / GLYCOLAX) 17 g packet  ? rivaroxaban (XARELTO) 20 MG TABS tablet  ? SUMAtriptan 6 MG/0.5ML SOAJ  ? Vibegron (GEMTESA) 75 MG TABS  ? ?No current facility-administered medications for this encounter.  ? ? ?Pt reports she is holding xarelto 7 days before surgery.  ? ? ?If no changes, I anticipate pt can proceed with surgery as scheduled.  ? ?Willeen Cass, PhD, FNP-BC ?[facility] Short Stay Surgical Center/Anesthesiology ?Phone: [phone] ?02/17/2022 12:57 PM ? ? ? ? ? ? ?

## 2022-02-20 NOTE — H&P (Signed)
TOTAL KNEE ADMISSION H&P ? ?Patient is being admitted for right total knee arthroplasty. ? ?Subjective: ? ?Chief Complaint:right knee pain. ? ?HPI: Bonnie Weeks, 76 y.o. female, has a history of pain and functional disability in the right knee due to arthritis and has failed non-surgical conservative treatments for greater than 12 weeks to includeNSAID's and/or analgesics, corticosteriod injections, and activity modification.  Onset of symptoms was gradual, starting 2 years ago with gradually worsening course since that time. The patient noted no past surgery on the right knee(s).  Patient currently rates pain in the right knee(s) at 8 out of 10 with activity. Patient has worsening of pain with activity and weight bearing, pain that interferes with activities of daily living, and pain with passive range of motion.  Patient has evidence of joint space narrowing by imaging studies. There is no active infection. ? ?Patient Active Problem List  ? Diagnosis Date Noted  ? S/P total knee arthroplasty, left 01/03/2022  ? Other dysphagia 11/01/2016  ? Esophageal dysphagia 09/05/2016  ? Encounter for screening colonoscopy 09/05/2016  ? Special screening for malignant neoplasms, colon 07/04/2016  ? Lumbar spine scoliosis 11/09/2015  ? ?Past Medical History:  ?Diagnosis Date  ? Anemia   ? Aortic stenosis   ? mild by 06/27/21 echo  ? Arthritis   ? Asthma   ? Back pain   ? Cancer [facility])   ? right breast cancer  ? Depression   ? Dyspnea   ? GERD (gastroesophageal reflux disease)   ? Headache   ? History of hiatal hernia   ? Hyperlipidemia   ? Hypertension   ? Pneumonia   ? 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  ?Past Surgical History:  ?Procedure Laterality Date  ? ANTERIOR CERVICAL DECOMP/DISCECTOMY FUSION  11/2014  ? "[facility]"  ? ANTERIOR LAT LUMBAR FUSION Left 11/09/2015  ? Procedure: Lumbar two-three,lumbar three-four.lumbar four-five Anterior Lateral Lumbar Fusion;  Surgeon: Erline Levine, MD;  Location: [place] NEURO ORS;  Service: Neurosurgery;  Laterality: Left;  ? BACK SURGERY    ? BLADDER REPAIR    ? BREAST LUMPECTOMY Right   ? BUNIONECTOMY Bilateral   ? COLONOSCOPY    ? COLONOSCOPY WITH PROPOFOL N/A 12/01/2016  ? Procedure: COLONOSCOPY WITH PROPOFOL;  Surgeon: Rogene Houston, MD;  Location: [facility];  Service: Endoscopy;  Laterality: N/A;  7:30  ? ESOPHAGEAL DILATION N/A 12/01/2016  ? Procedure: ESOPHAGEAL DILATION;  Surgeon: Rogene Houston, MD;  Location: [facility] ENDO SUITE;  Service: Endoscopy;  Laterality: N/A;  ? ESOPHAGOGASTRODUODENOSCOPY (EGD) WITH PROPOFOL N/A 12/01/2016  ? Procedure: ESOPHAGOGASTRODUODENOSCOPY (EGD) WITH PROPOFOL;  Surgeon: Rogene Houston, MD;  Location: [facility];  Service: Endoscopy;  Laterality: N/A;  ? HERNIA REPAIR    ? hiatal hernia repair x3  ? Hiatal hernia repair    ? NASAL SINUS SURGERY    ? POLYPECTOMY  12/01/2016  ? Procedure: POLYPECTOMY;  Surgeon: Rogene Houston, MD;  Location: [facility];  Service: Endoscopy;;  cecal polypectomy via cold biopsy  ? POSTERIOR LUMBAR FUSION  11/09/2015  ? L5-S1 with Pedicle Screws L2-LUMBAR SACRAL ONE/NOTES 11/09/2015  ? TONSILLECTOMY    ? TOTAL KNEE ARTHROPLASTY Left 01/03/2022  ? Procedure: TOTAL KNEE ARTHROPLASTY;  Surgeon: Paralee Cancel, MD;  Location: [facility] ORS;  Service: Orthopedics;  Laterality: Left;  ?  ?No current facility-administered medications for this encounter.  ? ?Current Outpatient Medications  ?Medication Sig Dispense Refill  Last Dose  ? atorvastatin (LIPITOR) 10 MG tablet Take 10 mg by mouth daily.     ? Calcium Carb-Cholecalciferol (CALCIUM 600/VITAMIN D PO) Take 1 tablet by mouth daily.     ? celecoxib (CELEBREX) 200 MG capsule Take 200 mg by mouth daily.     ? cetirizine (ZYRTEC) 10 MG tablet Take 10 mg by mouth daily.     ? denosumab (PROLIA) 60 MG/ML SOSY injection Inject 60 mg into the skin every 6 (six) months.     ? diltiazem (CARDIZEM) 30 MG tablet Take 1 tablet (30 mg total) 3 (three)  times daily before meals by mouth. (Patient taking differently: Take 30 mg by mouth daily.) 90 tablet 0   ? FLUoxetine (PROZAC) 40 MG capsule Take 40 mg by mouth daily.     ? hydrochlorothiazide (HYDRODIURIL) 25 MG tablet Take 25 mg by mouth daily.     ? HYDROcodone-acetaminophen (NORCO) 7.5-325 MG tablet Take 1-2 tablets by mouth every 4 (four) hours as needed for severe pain. 42 tablet 0   ? losartan-hydrochlorothiazide (HYZAAR) 50-12.5 MG tablet Take 1 tablet by mouth daily.     ? methocarbamol (ROBAXIN) 500 MG tablet Take 1 tablet (500 mg total) by mouth every 6 (six) hours as needed for muscle spasms. 40 tablet 0   ? montelukast (SINGULAIR) 10 MG tablet Take 10 mg by mouth daily.      ? Multiple Vitamins-Minerals (MULTIVITAMIN WITH MINERALS) tablet Take 1 tablet by mouth daily.     ? omeprazole (PRILOSEC) 40 MG capsule Take 40 mg by mouth daily.     ? omeprazole-sodium bicarbonate (ZEGERID) 40-1100 MG capsule Take 1 capsule by mouth 2 (two) times daily as needed (for acid reflux.).     ? polyethylene glycol (MIRALAX / GLYCOLAX) 17 g packet Take 17 g by mouth daily as needed for mild constipation. 14 each 0   ? rivaroxaban (XARELTO) 20 MG TABS tablet Take 20 mg by mouth daily.     ? SUMAtriptan 6 MG/0.5ML SOAJ Inject 6 mg into the skin daily as needed (For migraine).     ? Vibegron (GEMTESA) 75 MG TABS Take 75 mg by mouth daily.     ? docusate sodium (COLACE) 100 MG capsule Take 1 capsule (100 mg total) by mouth 2 (two) times daily. (Patient not taking: Reported on 02/13/2022) 10 capsule 0 Not Taking  ? ?Allergies  ?Allergen Reactions  ? Hydromorphone Anaphylaxis  ? Procaine Other (See Comments)  ?  Headache ?  ? Meloxicam Other (See Comments)  ?  unknown  ? Oxycodone Nausea And Vomiting  ? Sulfamethoxazole-Trimethoprim Other (See Comments)  ?  unknown  ?  ?Social History  ? ?Tobacco Use  ? Smoking status: Never  ? Smokeless tobacco: Never  ?Substance Use Topics  ? Alcohol use: No  ?  ?No family history on file.   ? ?Review of Systems  ?Constitutional:  Negative for chills and fever.  ?Respiratory:  Negative for cough and shortness of breath.   ?Cardiovascular:  Negative for chest pain.  ?Gastrointestinal:  Negative for nausea and vomiting.  ?Musculoskeletal:  Positive for arthralgias.  ? ? ?Objective: ? ?Physical Exam ?Well nourished and well developed. ?General: Alert and oriented x3, cooperative and pleasant, no acute distress. ?Head: normocephalic, atraumatic, neck supple. ?Eyes: EOMI. ? ?Musculoskeletal: ?Right Knee Exam: ?No palpable effusion, warmth or erythema ?genu varum ?5 degree flexion contractures with flexion close to 110 degrees with tightness and pain ?Stable and intact medial and lateral  collateral ligaments with some pseudolaxity medially ?No lower extremity edema, erythema or calf tenderness ? ?Left Knee: Well healing arthroplasty incision with dermabond in place. AROM 2-115. ? ?Calves soft and nontender. Motor function intact in LE. Strength 5/5 LE bilaterally. ?Neuro: Distal pulses 2+. Sensation to light touch intact in LE. ? ?Vital signs in last 24 hours: ?  ? ?Labs: ? ? ?Estimated body mass index is 32.81 kg/m? as calculated from the following: ?  Height as of 02/15/22: '5\' 3"'$  (1.6 m). ?  Weight as of 02/15/22: 84 kg. ? ? ?Imaging Review ?Plain radiographs demonstrate severe degenerative joint disease of the right knee(s). The overall alignment isneutral. The bone quality appears to be adequate for age and reported activity level. ? ? ? ? ? ?Assessment/Plan: ? ?End stage arthritis, right knee  ? ?The patient history, physical examination, clinical judgment of the provider and imaging studies are consistent with end stage degenerative joint disease of the right knee(s) and total knee arthroplasty is deemed medically necessary. The treatment options including medical management, injection therapy arthroscopy and arthroplasty were discussed at length. The risks and benefits of total knee arthroplasty were  presented and reviewed. The risks due to aseptic loosening, infection, stiffness, patella tracking problems, thromboembolic complications and other imponderables were discussed. The patient acknowledged

## 2022-02-21 ENCOUNTER — Encounter (HOSPITAL_COMMUNITY): Payer: Self-pay | Admitting: Orthopedic Surgery

## 2022-02-21 ENCOUNTER — Ambulatory Visit (HOSPITAL_BASED_OUTPATIENT_CLINIC_OR_DEPARTMENT_OTHER): Payer: Medicare PPO | Admitting: Anesthesiology

## 2022-02-21 ENCOUNTER — Observation Stay (HOSPITAL_COMMUNITY)
Admission: RE | Admit: 2022-02-21 | Discharge: 2022-02-23 | Disposition: A | Discharge status: Home / Self Care | Payer: Medicare PPO | Attending: Orthopedic Surgery | Admitting: Orthopedic Surgery

## 2022-02-21 ENCOUNTER — Ambulatory Visit (HOSPITAL_COMMUNITY): Payer: Medicare PPO | Admitting: Physician Assistant

## 2022-02-21 ENCOUNTER — Other Ambulatory Visit: Payer: Self-pay

## 2022-02-21 ENCOUNTER — Encounter (HOSPITAL_COMMUNITY)
Admission: RE | Disposition: A | Discharge status: Home / Self Care | Payer: Self-pay | Source: Home / Self Care | Attending: Orthopedic Surgery

## 2022-02-21 DIAGNOSIS — M6281 Muscle weakness (generalized): Secondary | ICD-10-CM | POA: Diagnosis not present

## 2022-02-21 DIAGNOSIS — D649 Anemia, unspecified: Secondary | ICD-10-CM | POA: Insufficient documentation

## 2022-02-21 DIAGNOSIS — Z86711 Personal history of pulmonary embolism: Secondary | ICD-10-CM | POA: Insufficient documentation

## 2022-02-21 DIAGNOSIS — M25761 Osteophyte, right knee: Secondary | ICD-10-CM | POA: Insufficient documentation

## 2022-02-21 DIAGNOSIS — Y838 Other surgical procedures as the cause of abnormal reaction of the patient, or of later complication, without mention of misadventure at the time of the procedure: Secondary | ICD-10-CM | POA: Insufficient documentation

## 2022-02-21 DIAGNOSIS — Z96651 Presence of right artificial knee joint: Secondary | ICD-10-CM

## 2022-02-21 DIAGNOSIS — M1711 Unilateral primary osteoarthritis, right knee: Secondary | ICD-10-CM | POA: Diagnosis not present

## 2022-02-21 DIAGNOSIS — M545 Low back pain, unspecified: Secondary | ICD-10-CM | POA: Diagnosis not present

## 2022-02-21 DIAGNOSIS — Z96652 Presence of left artificial knee joint: Secondary | ICD-10-CM | POA: Insufficient documentation

## 2022-02-21 DIAGNOSIS — K219 Gastro-esophageal reflux disease without esophagitis: Secondary | ICD-10-CM | POA: Insufficient documentation

## 2022-02-21 DIAGNOSIS — M65861 Other synovitis and tenosynovitis, right lower leg: Secondary | ICD-10-CM | POA: Insufficient documentation

## 2022-02-21 DIAGNOSIS — M25561 Pain in right knee: Secondary | ICD-10-CM | POA: Insufficient documentation

## 2022-02-21 DIAGNOSIS — Z79899 Other long term (current) drug therapy: Secondary | ICD-10-CM | POA: Insufficient documentation

## 2022-02-21 DIAGNOSIS — I10 Essential (primary) hypertension: Secondary | ICD-10-CM | POA: Diagnosis not present

## 2022-02-21 DIAGNOSIS — Y708 Miscellaneous anesthesiology devices associated with adverse incidents, not elsewhere classified: Secondary | ICD-10-CM | POA: Insufficient documentation

## 2022-02-21 DIAGNOSIS — Z6832 Body mass index (BMI) 32.0-32.9, adult: Secondary | ICD-10-CM | POA: Diagnosis not present

## 2022-02-21 DIAGNOSIS — R262 Difficulty in walking, not elsewhere classified: Secondary | ICD-10-CM | POA: Diagnosis not present

## 2022-02-21 DIAGNOSIS — Z7901 Long term (current) use of anticoagulants: Secondary | ICD-10-CM | POA: Diagnosis not present

## 2022-02-21 DIAGNOSIS — Z981 Arthrodesis status: Secondary | ICD-10-CM | POA: Diagnosis not present

## 2022-02-21 DIAGNOSIS — J45909 Unspecified asthma, uncomplicated: Secondary | ICD-10-CM | POA: Insufficient documentation

## 2022-02-21 DIAGNOSIS — Z791 Long term (current) use of non-steroidal anti-inflammatories (NSAID): Secondary | ICD-10-CM | POA: Insufficient documentation

## 2022-02-21 DIAGNOSIS — G4440 Drug-induced headache, not elsewhere classified, not intractable: Secondary | ICD-10-CM | POA: Insufficient documentation

## 2022-02-21 DIAGNOSIS — M25461 Effusion, right knee: Secondary | ICD-10-CM | POA: Insufficient documentation

## 2022-02-21 DIAGNOSIS — E785 Hyperlipidemia, unspecified: Secondary | ICD-10-CM | POA: Diagnosis not present

## 2022-02-21 DIAGNOSIS — T8859XA Other complications of anesthesia, initial encounter: Secondary | ICD-10-CM | POA: Diagnosis not present

## 2022-02-21 DIAGNOSIS — G8918 Other acute postprocedural pain: Secondary | ICD-10-CM | POA: Diagnosis not present

## 2022-02-21 DIAGNOSIS — Z853 Personal history of malignant neoplasm of breast: Secondary | ICD-10-CM | POA: Diagnosis not present

## 2022-02-21 DIAGNOSIS — E669 Obesity, unspecified: Secondary | ICD-10-CM | POA: Insufficient documentation

## 2022-02-21 DIAGNOSIS — F32A Depression, unspecified: Secondary | ICD-10-CM | POA: Diagnosis not present

## 2022-02-21 HISTORY — PX: TOTAL KNEE ARTHROPLASTY: SHX125

## 2022-02-21 LAB — APTT: aPTT: 26 seconds (ref 24–36)

## 2022-02-21 LAB — TYPE AND SCREEN
ABO/RH(D): A POS
Antibody Screen: NEGATIVE

## 2022-02-21 LAB — PROTIME-INR
INR: 0.9 (ref 0.8–1.2)
Prothrombin Time: 12 seconds (ref 11.4–15.2)

## 2022-02-21 SURGERY — ARTHROPLASTY, KNEE, TOTAL
Anesthesia: Monitor Anesthesia Care | Site: Knee | Laterality: Right

## 2022-02-21 MED ORDER — FENTANYL CITRATE (PF) 100 MCG/2ML IJ SOLN
INTRAMUSCULAR | Status: DC | PRN
Start: 2022-02-21 — End: 2022-02-21
  Administered 2022-02-21: 50 ug via INTRAVENOUS
  Administered 2022-02-21 (×2): 25 ug via INTRAVENOUS

## 2022-02-21 MED ORDER — BUPIVACAINE IN DEXTROSE 0.75-8.25 % IT SOLN
INTRATHECAL | Status: DC | PRN
  Administered 2022-02-21: 1.8 mL via INTRATHECAL

## 2022-02-21 MED ORDER — PROPOFOL 500 MG/50ML IV EMUL
INTRAVENOUS | Status: DC | PRN
  Administered 2022-02-21: 50 ug/kg/min via INTRAVENOUS
  Administered 2022-02-21: 75 ug/kg/min via INTRAVENOUS

## 2022-02-21 MED ORDER — DOCUSATE SODIUM 100 MG PO CAPS
100.0000 mg | ORAL_CAPSULE | Freq: Two times a day (BID) | ORAL | Status: DC
  Administered 2022-02-21 – 2022-02-23 (×4): 100 mg via ORAL
  Filled 2022-02-21 (×4): qty 1

## 2022-02-21 MED ORDER — METOCLOPRAMIDE HCL 5 MG PO TABS: 5.0000 mg | ORAL_TABLET | Freq: Three times a day (TID) | ORAL | Status: DC | PRN

## 2022-02-21 MED ORDER — HYDROCODONE-ACETAMINOPHEN 7.5-325 MG PO TABS
1.0000 | ORAL_TABLET | ORAL | Status: DC | PRN
  Administered 2022-02-21 – 2022-02-23 (×7): 2 via ORAL
  Filled 2022-02-21 (×8): qty 2

## 2022-02-21 MED ORDER — CELECOXIB 200 MG PO CAPS
200.0000 mg | ORAL_CAPSULE | Freq: Two times a day (BID) | ORAL | Status: DC
  Administered 2022-02-22 – 2022-02-23 (×3): 200 mg via ORAL
  Filled 2022-02-21 (×3): qty 1

## 2022-02-21 MED ORDER — PHENOL 1.4 % MT LIQD: 1.0000 | OROMUCOSAL | Status: DC | PRN

## 2022-02-21 MED ORDER — FENTANYL CITRATE PF 50 MCG/ML IJ SOSY: 25.0000 ug | PREFILLED_SYRINGE | INTRAMUSCULAR | Status: DC | PRN

## 2022-02-21 MED ORDER — ROPIVACAINE HCL 5 MG/ML IJ SOLN
INTRAMUSCULAR | Status: DC | PRN
  Administered 2022-02-21: 20 mL via PERINEURAL

## 2022-02-21 MED ORDER — DILTIAZEM HCL 30 MG PO TABS
30.0000 mg | ORAL_TABLET | Freq: Every day | ORAL | Status: DC
  Administered 2022-02-22 – 2022-02-23 (×2): 30 mg via ORAL
  Filled 2022-02-21 (×2): qty 1

## 2022-02-21 MED ORDER — KETOROLAC TROMETHAMINE 30 MG/ML IJ SOLN
INTRAMUSCULAR | Status: AC
  Filled 2022-02-21: qty 1

## 2022-02-21 MED ORDER — TRANEXAMIC ACID-NACL 1000-0.7 MG/100ML-% IV SOLN
1000.0000 mg | INTRAVENOUS | Status: AC
  Administered 2022-02-21: 1000 mg via INTRAVENOUS
  Filled 2022-02-21: qty 100

## 2022-02-21 MED ORDER — SODIUM CHLORIDE 0.9 % IV SOLN: INTRAVENOUS | Status: DC

## 2022-02-21 MED ORDER — LIDOCAINE HCL (PF) 2 % IJ SOLN
INTRAMUSCULAR | Status: AC
  Filled 2022-02-21: qty 5

## 2022-02-21 MED ORDER — MENTHOL 3 MG MT LOZG: 1.0000 | LOZENGE | OROMUCOSAL | Status: DC | PRN

## 2022-02-21 MED ORDER — EPHEDRINE SULFATE-NACL 50-0.9 MG/10ML-% IV SOSY
PREFILLED_SYRINGE | INTRAVENOUS | Status: DC | PRN
  Administered 2022-02-21: 10 mg via INTRAVENOUS
  Administered 2022-02-21: 5 mg via INTRAVENOUS

## 2022-02-21 MED ORDER — CHLORHEXIDINE GLUCONATE 0.12 % MT SOLN
15.0000 mL | Freq: Once | OROMUCOSAL | Status: AC
  Administered 2022-02-21: 15 mL via OROMUCOSAL

## 2022-02-21 MED ORDER — ORAL CARE MOUTH RINSE: 15.0000 mL | Freq: Once | OROMUCOSAL | Status: AC

## 2022-02-21 MED ORDER — ATORVASTATIN CALCIUM 10 MG PO TABS
10.0000 mg | ORAL_TABLET | Freq: Every day | ORAL | Status: DC
  Administered 2022-02-22 – 2022-02-23 (×2): 10 mg via ORAL
  Filled 2022-02-21 (×2): qty 1

## 2022-02-21 MED ORDER — MIDAZOLAM HCL 2 MG/2ML IJ SOLN
INTRAMUSCULAR | Status: AC
  Filled 2022-02-21: qty 2

## 2022-02-21 MED ORDER — LOSARTAN POTASSIUM-HCTZ 50-12.5 MG PO TABS: 1.0000 | ORAL_TABLET | Freq: Every day | ORAL | Status: DC

## 2022-02-21 MED ORDER — DEXMEDETOMIDINE (PRECEDEX) IN NS 20 MCG/5ML (4 MCG/ML) IV SYRINGE
PREFILLED_SYRINGE | INTRAVENOUS | Status: AC
  Filled 2022-02-21: qty 5

## 2022-02-21 MED ORDER — MIDAZOLAM HCL 5 MG/5ML IJ SOLN
INTRAMUSCULAR | Status: DC | PRN
  Administered 2022-02-21 (×2): 1 mg via INTRAVENOUS

## 2022-02-21 MED ORDER — BISACODYL 10 MG RE SUPP: 10.0000 mg | Freq: Every day | RECTAL | Status: DC | PRN

## 2022-02-21 MED ORDER — METHOCARBAMOL 500 MG IVPB - SIMPLE MED
500.0000 mg | Freq: Four times a day (QID) | INTRAVENOUS | Status: DC | PRN
  Filled 2022-02-21: qty 50

## 2022-02-21 MED ORDER — METOCLOPRAMIDE HCL 5 MG/ML IJ SOLN: 5.0000 mg | Freq: Three times a day (TID) | INTRAMUSCULAR | Status: DC | PRN

## 2022-02-21 MED ORDER — SODIUM CHLORIDE (PF) 0.9 % IJ SOLN
INTRAMUSCULAR | Status: AC
  Filled 2022-02-21: qty 30

## 2022-02-21 MED ORDER — PANTOPRAZOLE SODIUM 40 MG PO TBEC
40.0000 mg | DELAYED_RELEASE_TABLET | Freq: Every day | ORAL | Status: DC
  Administered 2022-02-22 – 2022-02-23 (×2): 40 mg via ORAL
  Filled 2022-02-21 (×2): qty 1

## 2022-02-21 MED ORDER — CEFAZOLIN SODIUM-DEXTROSE 2-4 GM/100ML-% IV SOLN
2.0000 g | Freq: Four times a day (QID) | INTRAVENOUS | Status: AC
  Administered 2022-02-21 (×2): 2 g via INTRAVENOUS
  Filled 2022-02-21 (×2): qty 100

## 2022-02-21 MED ORDER — DEXMEDETOMIDINE (PRECEDEX) IN NS 20 MCG/5ML (4 MCG/ML) IV SYRINGE
PREFILLED_SYRINGE | INTRAVENOUS | Status: DC | PRN
  Administered 2022-02-21: 8 ug via INTRAVENOUS
  Administered 2022-02-21: 4 ug via INTRAVENOUS

## 2022-02-21 MED ORDER — ONDANSETRON HCL 4 MG PO TABS: 4.0000 mg | ORAL_TABLET | Freq: Four times a day (QID) | ORAL | Status: DC | PRN

## 2022-02-21 MED ORDER — ONDANSETRON HCL 4 MG/2ML IJ SOLN
INTRAMUSCULAR | Status: AC
  Filled 2022-02-21: qty 2

## 2022-02-21 MED ORDER — POVIDONE-IODINE 10 % EX SWAB
2.0000 "application " | Freq: Once | CUTANEOUS | Status: AC
  Administered 2022-02-21: 2 via TOPICAL

## 2022-02-21 MED ORDER — CEFAZOLIN SODIUM-DEXTROSE 2-4 GM/100ML-% IV SOLN
2.0000 g | INTRAVENOUS | Status: AC
  Administered 2022-02-21: 2 g via INTRAVENOUS
  Filled 2022-02-21: qty 100

## 2022-02-21 MED ORDER — DEXAMETHASONE SODIUM PHOSPHATE 10 MG/ML IJ SOLN
10.0000 mg | Freq: Once | INTRAMUSCULAR | Status: AC
  Administered 2022-02-22: 10 mg via INTRAVENOUS
  Filled 2022-02-21: qty 1

## 2022-02-21 MED ORDER — FLUOXETINE HCL 20 MG PO CAPS
40.0000 mg | ORAL_CAPSULE | Freq: Every day | ORAL | Status: DC
  Administered 2022-02-22 – 2022-02-23 (×2): 40 mg via ORAL
  Filled 2022-02-21 (×2): qty 2

## 2022-02-21 MED ORDER — KETOROLAC TROMETHAMINE 15 MG/ML IJ SOLN
15.0000 mg | Freq: Four times a day (QID) | INTRAMUSCULAR | Status: AC
  Administered 2022-02-21 – 2022-02-22 (×2): 15 mg via INTRAVENOUS
  Filled 2022-02-21 (×2): qty 1

## 2022-02-21 MED ORDER — FENTANYL CITRATE (PF) 100 MCG/2ML IJ SOLN
INTRAMUSCULAR | Status: AC
  Filled 2022-02-21: qty 2

## 2022-02-21 MED ORDER — LOSARTAN POTASSIUM 50 MG PO TABS
50.0000 mg | ORAL_TABLET | Freq: Every day | ORAL | Status: DC
  Administered 2022-02-22 – 2022-02-23 (×2): 50 mg via ORAL
  Filled 2022-02-21 (×2): qty 1

## 2022-02-21 MED ORDER — MONTELUKAST SODIUM 10 MG PO TABS
10.0000 mg | ORAL_TABLET | Freq: Every day | ORAL | Status: DC
  Administered 2022-02-22 – 2022-02-23 (×2): 10 mg via ORAL
  Filled 2022-02-21 (×2): qty 1

## 2022-02-21 MED ORDER — VANCOMYCIN HCL IN DEXTROSE 1-5 GM/200ML-% IV SOLN
1000.0000 mg | INTRAVENOUS | Status: AC
  Administered 2022-02-21: 1000 mg via INTRAVENOUS
  Filled 2022-02-21: qty 200

## 2022-02-21 MED ORDER — HYDROCODONE-ACETAMINOPHEN 5-325 MG PO TABS: 1.0000 | ORAL_TABLET | ORAL | Status: DC | PRN

## 2022-02-21 MED ORDER — POLYETHYLENE GLYCOL 3350 17 G PO PACK: 17.0000 g | PACK | Freq: Every day | ORAL | Status: DC | PRN

## 2022-02-21 MED ORDER — ACETAMINOPHEN 325 MG PO TABS
325.0000 mg | ORAL_TABLET | Freq: Four times a day (QID) | ORAL | Status: DC | PRN
  Administered 2022-02-21: 650 mg via ORAL
  Filled 2022-02-21: qty 2

## 2022-02-21 MED ORDER — KETOROLAC TROMETHAMINE 30 MG/ML IJ SOLN
INTRAMUSCULAR | Status: DC | PRN
  Administered 2022-02-21: 30 mg

## 2022-02-21 MED ORDER — HYDROCHLOROTHIAZIDE 12.5 MG PO TABS
12.5000 mg | ORAL_TABLET | Freq: Every day | ORAL | Status: DC
  Administered 2022-02-22: 12.5 mg via ORAL
  Filled 2022-02-21 (×2): qty 1

## 2022-02-21 MED ORDER — VANCOMYCIN HCL IN DEXTROSE 1-5 GM/200ML-% IV SOLN
1000.0000 mg | Freq: Once | INTRAVENOUS | Status: DC
Start: 2022-02-21 — End: 2022-02-21

## 2022-02-21 MED ORDER — MORPHINE SULFATE (PF) 2 MG/ML IV SOLN
0.5000 mg | INTRAVENOUS | Status: DC | PRN
  Administered 2022-02-21 – 2022-02-23 (×5): 1 mg via INTRAVENOUS
  Filled 2022-02-21 (×5): qty 1

## 2022-02-21 MED ORDER — CELECOXIB 200 MG PO CAPS: 200.0000 mg | ORAL_CAPSULE | Freq: Two times a day (BID) | ORAL | Status: DC

## 2022-02-21 MED ORDER — PHENYLEPHRINE HCL-NACL 20-0.9 MG/250ML-% IV SOLN
INTRAVENOUS | Status: AC
  Filled 2022-02-21: qty 250

## 2022-02-21 MED ORDER — ONDANSETRON HCL 4 MG/2ML IJ SOLN: 4.0000 mg | Freq: Four times a day (QID) | INTRAMUSCULAR | Status: DC | PRN

## 2022-02-21 MED ORDER — PHENYLEPHRINE HCL-NACL 20-0.9 MG/250ML-% IV SOLN
INTRAVENOUS | Status: DC | PRN
  Administered 2022-02-21: 50 ug/min via INTRAVENOUS

## 2022-02-21 MED ORDER — BUPIVACAINE-EPINEPHRINE (PF) 0.25% -1:200000 IJ SOLN
INTRAMUSCULAR | Status: DC | PRN
Start: 2022-02-21 — End: 2022-02-21
  Administered 2022-02-21: 30 mL

## 2022-02-21 MED ORDER — ONDANSETRON HCL 4 MG/2ML IJ SOLN
INTRAMUSCULAR | Status: DC | PRN
  Administered 2022-02-21: 4 mg via INTRAVENOUS

## 2022-02-21 MED ORDER — FERROUS SULFATE 325 (65 FE) MG PO TABS
325.0000 mg | ORAL_TABLET | Freq: Three times a day (TID) | ORAL | Status: DC
  Administered 2022-02-22 – 2022-02-23 (×2): 325 mg via ORAL
  Filled 2022-02-21 (×2): qty 1

## 2022-02-21 MED ORDER — RIVAROXABAN 10 MG PO TABS
20.0000 mg | ORAL_TABLET | Freq: Every day | ORAL | Status: DC
  Administered 2022-02-22 – 2022-02-23 (×2): 20 mg via ORAL
  Filled 2022-02-21 (×2): qty 2

## 2022-02-21 MED ORDER — TRANEXAMIC ACID-NACL 1000-0.7 MG/100ML-% IV SOLN
1000.0000 mg | Freq: Once | INTRAVENOUS | Status: AC
  Administered 2022-02-21: 1000 mg via INTRAVENOUS
  Filled 2022-02-21: qty 100

## 2022-02-21 MED ORDER — LACTATED RINGERS IV SOLN: INTRAVENOUS | Status: DC

## 2022-02-21 MED ORDER — DEXAMETHASONE SODIUM PHOSPHATE 10 MG/ML IJ SOLN
8.0000 mg | Freq: Once | INTRAMUSCULAR | Status: AC
  Administered 2022-02-21: 8 mg via INTRAVENOUS

## 2022-02-21 MED ORDER — LIDOCAINE HCL (CARDIAC) PF 100 MG/5ML IV SOSY
PREFILLED_SYRINGE | INTRAVENOUS | Status: DC | PRN
Start: 2022-02-21 — End: 2022-02-21
  Administered 2022-02-21: 10 mg via INTRAVENOUS
  Administered 2022-02-21: 40 mg via INTRAVENOUS

## 2022-02-21 MED ORDER — METHOCARBAMOL 500 MG PO TABS
500.0000 mg | ORAL_TABLET | Freq: Four times a day (QID) | ORAL | Status: DC | PRN
  Administered 2022-02-21 – 2022-02-22 (×3): 500 mg via ORAL
  Filled 2022-02-21 (×3): qty 1

## 2022-02-21 MED ORDER — DEXAMETHASONE SODIUM PHOSPHATE 10 MG/ML IJ SOLN
INTRAMUSCULAR | Status: AC
  Filled 2022-02-21: qty 1

## 2022-02-21 MED ORDER — DIPHENHYDRAMINE HCL 12.5 MG/5ML PO ELIX: 12.5000 mg | ORAL_SOLUTION | ORAL | Status: DC | PRN

## 2022-02-21 MED ORDER — SODIUM CHLORIDE (PF) 0.9 % IJ SOLN
INTRAMUSCULAR | Status: DC | PRN
  Administered 2022-02-21: 30 mL

## 2022-02-21 MED ORDER — LORATADINE 10 MG PO TABS
10.0000 mg | ORAL_TABLET | Freq: Every day | ORAL | Status: DC
Start: 2022-02-22 — End: 2022-02-23
  Administered 2022-02-22 – 2022-02-23 (×2): 10 mg via ORAL
  Filled 2022-02-21 (×2): qty 1

## 2022-02-21 MED ORDER — PROPOFOL 1000 MG/100ML IV EMUL
INTRAVENOUS | Status: AC
  Filled 2022-02-21: qty 100

## 2022-02-21 MED ORDER — BUPIVACAINE-EPINEPHRINE (PF) 0.25% -1:200000 IJ SOLN
INTRAMUSCULAR | Status: AC
  Filled 2022-02-21: qty 30

## 2022-02-21 MED ORDER — HYDROCHLOROTHIAZIDE 25 MG PO TABS
25.0000 mg | ORAL_TABLET | Freq: Every day | ORAL | Status: DC
  Administered 2022-02-22 – 2022-02-23 (×2): 25 mg via ORAL
  Filled 2022-02-21 (×2): qty 1

## 2022-02-21 MED ORDER — SODIUM CHLORIDE 0.9 % IR SOLN
Status: DC | PRN
  Administered 2022-02-21: 1000 mL

## 2022-02-21 MED ORDER — PROMETHAZINE HCL 25 MG/ML IJ SOLN: 6.2500 mg | INTRAMUSCULAR | Status: DC | PRN

## 2022-02-21 MED ORDER — VIBEGRON 75 MG PO TABS: 75.0000 mg | ORAL_TABLET | Freq: Every day | ORAL | Status: DC

## 2022-02-21 MED ORDER — 0.9 % SODIUM CHLORIDE (POUR BTL) OPTIME
TOPICAL | Status: DC | PRN
  Administered 2022-02-21: 1000 mL

## 2022-02-21 SURGICAL SUPPLY — 52 items
ATTUNE MED ANAT PAT 38 KNEE (Knees) ×1 IMPLANT
ATTUNE PS FEM RT SZ 5 CEM KNEE (Femur) ×1 IMPLANT
ATTUNE PSRP INSR SZ5 8 KNEE (Insert) ×1 IMPLANT
BAG COUNTER SPONGE SURGICOUNT (BAG) ×1 IMPLANT
BAG ZIPLOCK 12X15 (MISCELLANEOUS) ×1 IMPLANT
BASE TIBIA ATTUNE KNEE SYS SZ6 (Knees) IMPLANT
BLADE SAW SGTL 11.0X1.19X90.0M (BLADE) IMPLANT
BLADE SAW SGTL 13.0X1.19X90.0M (BLADE) ×2 IMPLANT
BLADE SURG SZ10 CARB STEEL (BLADE) ×4 IMPLANT
BNDG ELASTIC 6X5.8 VLCR STR LF (GAUZE/BANDAGES/DRESSINGS) ×2 IMPLANT
BOWL SMART MIX CTS (DISPOSABLE) ×2 IMPLANT
CEMENT HV SMART SET (Cement) ×2 IMPLANT
CUFF TOURN SGL QUICK 34 (TOURNIQUET CUFF) ×1
CUFF TRNQT CYL 34X4.125X (TOURNIQUET CUFF) ×1 IMPLANT
DERMABOND ADVANCED (GAUZE/BANDAGES/DRESSINGS) ×1
DERMABOND ADVANCED .7 DNX12 (GAUZE/BANDAGES/DRESSINGS) ×1 IMPLANT
DRAPE INCISE IOBAN 66X45 STRL (DRAPES) ×1 IMPLANT
DRAPE U-SHAPE 47X51 STRL (DRAPES) ×2 IMPLANT
DRESSING AQUACEL AG SP 3.5X10 (GAUZE/BANDAGES/DRESSINGS) ×1 IMPLANT
DRSG AQUACEL AG SP 3.5X10 (GAUZE/BANDAGES/DRESSINGS) ×2
DURAPREP 26ML APPLICATOR (WOUND CARE) ×4 IMPLANT
ELECT REM PT RETURN 15FT ADLT (MISCELLANEOUS) ×2 IMPLANT
GLOVE SURG ENC MOIS LTX SZ6 (GLOVE) ×2 IMPLANT
GLOVE SURG ENC MOIS LTX SZ7 (GLOVE) ×2 IMPLANT
GLOVE SURG UNDER LTX SZ6.5 (GLOVE) ×2 IMPLANT
GLOVE SURG UNDER POLY LF SZ7.5 (GLOVE) ×2 IMPLANT
GOWN STRL REUS W/ TWL LRG LVL3 (GOWN DISPOSABLE) ×1 IMPLANT
GOWN STRL REUS W/TWL LRG LVL3 (GOWN DISPOSABLE) ×1
HANDPIECE INTERPULSE COAX TIP (DISPOSABLE) ×1
HOLDER FOLEY CATH W/STRAP (MISCELLANEOUS) ×1 IMPLANT
KIT TURNOVER KIT A (KITS) IMPLANT
MANIFOLD NEPTUNE II (INSTRUMENTS) ×2 IMPLANT
NDL SAFETY ECLIPSE 18X1.5 (NEEDLE) IMPLANT
NEEDLE HYPO 18GX1.5 SHARP (NEEDLE) ×1
NS IRRIG 1000ML POUR BTL (IV SOLUTION) ×2 IMPLANT
PACK TOTAL KNEE CUSTOM (KITS) ×2 IMPLANT
PROTECTOR NERVE ULNAR (MISCELLANEOUS) ×2 IMPLANT
SET HNDPC FAN SPRY TIP SCT (DISPOSABLE) ×1 IMPLANT
SET PAD KNEE POSITIONER (MISCELLANEOUS) ×2 IMPLANT
SPIKE FLUID TRANSFER (MISCELLANEOUS) ×2 IMPLANT
SUT MNCRL AB 4-0 PS2 18 (SUTURE) ×2 IMPLANT
SUT STRATAFIX PDS+ 0 24IN (SUTURE) ×2 IMPLANT
SUT VIC AB 1 CT1 36 (SUTURE) ×3 IMPLANT
SUT VIC AB 2-0 CT1 27 (SUTURE) ×2
SUT VIC AB 2-0 CT1 TAPERPNT 27 (SUTURE) ×2 IMPLANT
SYR 3ML LL SCALE MARK (SYRINGE) ×2 IMPLANT
TIBIA ATTUNE KNEE SYS BASE SZ6 (Knees) ×2 IMPLANT
TRAY FOLEY MTR SLVR 14FR STAT (SET/KITS/TRAYS/PACK) ×1 IMPLANT
TRAY FOLEY MTR SLVR 16FR STAT (SET/KITS/TRAYS/PACK) ×1 IMPLANT
TUBE SUCTION HIGH CAP CLEAR NV (SUCTIONS) ×2 IMPLANT
WATER STERILE IRR 1000ML POUR (IV SOLUTION) ×4 IMPLANT
WRAP KNEE MAXI GEL POST OP (GAUZE/BANDAGES/DRESSINGS) ×2 IMPLANT

## 2022-02-21 NOTE — Interval H&P Note (Signed)
History and Physical Interval Note: ? ?02/21/2022 ?6:51 AM ? ?Bonnie Weeks  has presented today for surgery, with the diagnosis of Right knee osteoarthritis.  The various methods of treatment have been discussed with the patient and family. After consideration of risks, benefits and other options for treatment, the patient has consented to  Procedure(s): ?TOTAL KNEE ARTHROPLASTY (Right) as a surgical intervention.  The patient's history has been reviewed, patient examined, no change in status, stable for surgery.  I have reviewed the patient's chart and labs.  Questions were answered to the patient's satisfaction.   ? ? ?Mauri Pole ? ? ?

## 2022-02-21 NOTE — Op Note (Signed)
NAME:  Bonnie Weeks                  ?   ? MEDICAL RECORD NO.:  [id number]  ?   ?                        FACILITY:  [facility]  ?   ? PHYSICIAN:  Pietro Cassis. Alvan Dame, M.D.  DATE OF BIRTH:  1945-12-16  ?   ? DATE OF PROCEDURE:  02/21/2022   ?  ?   ?                             OPERATIVE REPORT  ?   ?   ? PREOPERATIVE DIAGNOSIS:  Right knee osteoarthritis.  ?   ? POSTOPERATIVE DIAGNOSIS:  Right knee osteoarthritis.  ?   ? FINDINGS:  The patient was noted to have complete loss of cartilage and  ? bone-on-bone arthritis with associated osteophytes in the medial and patellofemoral compartments of  ? the knee with a significant synovitis and associated effusion.  The patient had failed months of conservative treatment including medications, injection therapy, activity modification. ?   ? PROCEDURE:  Right total knee replacement.  ?   ? COMPONENTS USED:  DePuy Attune rotating platform posterior stabilized knee  ? system, a size 5 femur, 6 tibia, size 8 mm PS AOX insert, and 38 anatomic patellar  ? button.  ?   ? SURGEON:  Pietro Cassis. Alvan Dame, M.D.  ?   ? ASSISTANT:  Costella Hatcher, PA-C.  ?   ? ANESTHESIA:  Regional and Spinal.  ?   ? SPECIMENS:  None.  ?   ? COMPLICATION:  None.  ?   ? DRAINS:  None. ? ?EBL: <150 cc  ?   ? TOURNIQUET TIME:   ?Total Tourniquet Time Documented: ?Thigh (Right) - 27 minutes ?Total: Thigh (Right) - 27 minutes ? .  ?   ? The patient was stable to the recovery room.  ?   ? INDICATION FOR PROCEDURE:  [name] is a 76 y.o. female patient of  ? mine.  The patient had been seen, evaluated, and treated for months conservatively in the  ? office with medication, activity modification, and injections.  The patient had  ? radiographic changes of bone-on-bone arthritis with endplate sclerosis and osteophytes noted.  Based on the radiographic changes and failed conservative measures, the patient  ? decided to proceed with definitive treatment, total knee replacement.  Risks of infection, DVT, component failure, need  for revision surgery, neurovascular injury were reviewed in the office setting.  The postop course was reviewed stressing the efforts to maximize post-operative satisfaction and function.  Consent was obtained for benefit of pain  ? relief.  ?   ? PROCEDURE IN DETAIL:  The patient was brought to the operative theater.  ? Once adequate anesthesia, preoperative antibiotics, 2 gm of Ancef,1 gm of Tranexamic Acid, and 10 mg of Decadron administered, the patient was positioned supine with a right thigh tourniquet placed.  The  ?right lower extremity was prepped and draped in sterile fashion.  A time-  ? out was performed identifying the patient, planned procedure, and the appropriate extremity.  ?   ? The right lower extremity was placed in the [facility] leg holder.  The leg was  ? exsanguinated, tourniquet elevated to 225 mmHg.  A midline incision was  ?  made followed by median parapatellar arthrotomy.  Following initial  ? exposure, attention was first directed to the patella.  Precut  ? measurement was noted to be 22 mm.  I resected down to 13 mm and used a  ? 38 anatomic patellar button to restore patellar height as well as cover the cut surface.  ?   ? The lug holes were drilled and a metal shim was placed to protect the  ? patella from retractors and saw blade during the procedure.  ?   ? At this point, attention was now directed to the femur.  The femoral  ? canal was opened with a drill, irrigated to try to prevent fat emboli.  An  ? intramedullary rod was passed at 3 degrees valgus, 9 mm of bone was  ? resected off the distal femur.  Following this resection, the tibia was  ? subluxated anteriorly.  Using the extramedullary guide, 2 mm of bone was resected off  ? the proximal medial tibia.  We confirmed the gap would be  ? stable medially and laterally with a size 6 spacer block as well as confirmed that the tibial cut was perpendicular in the coronal plane, checking with an alignment rod.  ?   ? Once this was  done, I sized the femur to be a size 5 in the anterior-  ? posterior dimension, chose a standard component based on medial and  ? lateral dimension.  The size 5 rotation block was then pinned in  ? position anterior referenced using the C-clamp to set rotation.  The  ? anterior, posterior, and  chamfer cuts were made without difficulty nor  ? notching making certain that I was along the anterior cortex to help  ? with flexion gap stability.  ?   ? The final box cut was made off the lateral aspect of distal femur.  ?   ? At this point, the tibia was sized to be a size 6.  The size 6 tray was  ? then pinned in position through the medial third of the tubercle,  ? drilled, and keel punched.  Trial reduction was now carried with a 5 femur, ? 6 tibia, a size 8 mm PS insert, and the 38 anatomic patella botton.  The knee was brought to full extension with good flexion stability with the patella  ? tracking through the trochlea without application of pressure.  Given  ? all these findings the trial components removed.  Final components were  ? opened and cement was mixed.  The knee was irrigated with normal saline solution and pulse lavage.  The synovial lining was  ? then injected with 30 cc of 0.25% Marcaine with epinephrine, 1 cc of Toradol and 30 cc of NS for a total of 61 cc.  ?   ?Final implants were then cemented onto cleaned and dried cut surfaces of bone with the knee brought to extension with a size 8 mm PS trial insert.  ?   ? Once the cement had fully cured, excess cement was removed  ? throughout the knee.  I confirmed that I was satisfied with the range of  ? motion and stability, and the final size 8 mm PS AOX insert was chosen.  It was  ? placed into the knee.  ?   ? The tourniquet had been let down at 27 minutes.  No significant  ? hemostasis was required.  The extensor mechanism was then reapproximated using #1 Vicryl  and #1 Stratafix sutures with the knee  ? in flexion.  The  ? remaining wound was closed  with 2-0 Vicryl and running 4-0 Monocryl.  ? The knee was cleaned, dried, dressed sterilely using Dermabond and  ? Aquacel dressing.  The patient was then  ? brought to recovery room in stable condition, tolerating the procedure  ? well.  ? ?Please note that Physician Assistant, Costella Hatcher, PA-C was present for the entirety of the case, and was utilized for pre-operative positioning, peri-operative retractor management, general facilitation of the procedure and for primary wound closure at the end of the case. ?   ?   ?   ?   ? Pietro Cassis Alvan Dame, M.D.  ? ? ?02/21/2022 8:49 AM  ?

## 2022-02-21 NOTE — Transfer of Care (Signed)
Immediate Anesthesia Transfer of Care Note ? ?Patient: Bonnie Weeks ? ?Procedure(s) Performed: TOTAL KNEE ARTHROPLASTY (Right: Knee) ? ?Patient Location: PACU ? ?Anesthesia Type:Spinal ? ?Level of Consciousness: awake, alert , oriented and patient cooperative ? ?Airway & Oxygen Therapy: Patient Spontanous Breathing and Patient connected to face mask oxygen ? ?Post-op Assessment: Report given to RN ? ?Post vital signs: Reviewed and stable ? ?Last Vitals:  ?Vitals Value Taken Time  ?BP 88/63 02/21/22 0915  ?Temp 36.4 ?C 02/21/22 0915  ?Pulse 63 02/21/22 0918  ?Resp 16 02/21/22 0918  ?SpO2 100 % 02/21/22 0918  ?Vitals shown include unvalidated device data. ? ?Last Pain:  ?Vitals:  ? 02/21/22 0915  ?TempSrc:   ?PainSc: 0-No pain  ?   ? ?  ? ?Complications: No notable events documented. ?

## 2022-02-21 NOTE — Care Plan (Signed)
Ortho Bundle Case Management Note ? ?Patient Details  ?Name: Bonnie Weeks ?MRN: [id number] ?Date of Birth: 1946-11-05 ? ?                ?R TKA on 02-21-22 DCP: Home with dtr and husband DME: No needs, has a RW PT: [facility]. Referral sent for [facility] 02-24-22. ? ? ?DME Arranged:  N/A ?DME Agency:    ? ?HH Arranged:    ?[place] Agency:    ? ?Additional Comments: ?Please contact me with any questions of if this plan should need to change. ? ?Larwance Rote  [phone] ?02/21/2022, 12:40 PM ?  ?

## 2022-02-21 NOTE — Anesthesia Postprocedure Evaluation (Signed)
Anesthesia Post Note ? ?Patient: Bonnie Weeks ? ?Procedure(s) Performed: TOTAL KNEE ARTHROPLASTY (Right: Knee) ? ?  ? ?Patient location during evaluation: PACU ?Anesthesia Type: MAC ?Level of consciousness: awake and alert ?Pain management: pain level controlled ?Vital Signs Assessment: post-procedure vital signs reviewed and stable ?Respiratory status: spontaneous breathing, nonlabored ventilation and respiratory function stable ?Cardiovascular status: blood pressure returned to baseline and stable ?Postop Assessment: no apparent nausea or vomiting ?Anesthetic complications: no ? ? ?No notable events documented. ? ?Last Vitals:  ?Vitals:  ? 02/21/22 1015 02/21/22 1030  ?BP: (!) 115/56 (!) 118/58  ?Pulse: 69 66  ?Resp: 18 17  ?Temp:    ?SpO2: 97% 98%  ?  ?Last Pain:  ?Vitals:  ? 02/21/22 1030  ?TempSrc:   ?PainSc: 0-No pain  ? ? ?  ?  ?  ?  ?  ?  ? ?Lynda Rainwater ? ? ? ? ?

## 2022-02-21 NOTE — Discharge Instructions (Signed)

## 2022-02-21 NOTE — Progress Notes (Signed)
Was asked to see Bonnie Weeks to evaluate for possible spinal headache.  Bonnie Weeks was sitting up in her chair, smiling with lights on and very pleasant.  She explained that she has had a headache since this morning.  This is very unlikely to be a Post dural puncture headache. ? ?Bonnie Weeks explained that she is also prone to headaches and migraines.  Would treat this headache as any other headache would be treated.  Anesthesia can reevaluate if primary team deems necessary. ?

## 2022-02-21 NOTE — Anesthesia Preprocedure Evaluation (Addendum)
Anesthesia Evaluation  ?Patient identified by MRN, date of birth, ID band ?Patient awake ? ? ? ?Reviewed: ?Allergy & Precautions, NPO status , Patient's Chart, lab work & pertinent test results ? ?Airway ?Mallampati: II ? ?TM Distance: >3 FB ?Neck ROM: Full ? ? ? Dental ?no notable dental hx. ? ?  ?Pulmonary ?asthma ,  ?  ?Pulmonary exam normal ?breath sounds clear to auscultation ? ? ? ? ? ? Cardiovascular ?hypertension, Pt. on medications ?negative cardio ROS ?Normal cardiovascular exam ?Rhythm:Regular Rate:Normal ? ? ?  ?Neuro/Psych ? Headaches, Depression negative psych ROS  ? GI/Hepatic ?Neg liver ROS, GERD  ,  ?Endo/Other  ?negative endocrine ROS ? Renal/GU ?negative Renal ROS  ?negative genitourinary ?  ?Musculoskeletal ? ?(+) Arthritis , Osteoarthritis,   ? Abdominal ?(+) + obese,   ?Peds ?negative pediatric ROS ?(+)  Hematology ?negative hematology ROS ?(+)   ?Anesthesia Other Findings ? ? Reproductive/Obstetrics ?negative OB ROS ? ?  ? ? ? ? ? ? ? ? ? ? ? ? ? ?  ?  ? ? ? ? ? ? ? ?Anesthesia Physical ?Anesthesia Plan ? ?ASA: 2 ? ?Anesthesia Plan: MAC and Spinal  ? ?Post-op Pain Management: Regional block*  ? ?Induction: Intravenous ? ?PONV Risk Score and Plan: 2 and Ondansetron, Midazolam and Treatment may vary due to age or medical condition ? ?Airway Management Planned: Simple Face Mask ? ?Additional Equipment:  ? ?Intra-op Plan:  ? ?Post-operative Plan:  ? ?Informed Consent: I have reviewed the patients History and Physical, chart, labs and discussed the procedure including the risks, benefits and alternatives for the proposed anesthesia with the patient or authorized representative who has indicated his/her understanding and acceptance.  ? ? ? ?Dental advisory given ? ?Plan Discussed with: CRNA ? ?Anesthesia Plan Comments:   ? ? ? ? ? ? ?Anesthesia Quick Evaluation ? ?

## 2022-02-21 NOTE — Evaluation (Signed)
Physical Therapy Evaluation ?Patient Details ?Name: Bonnie Weeks ?MRN: [id number] ?DOB: 1946/08/22 ?Today's Date: 02/21/2022 ? ?History of Present Illness ? Patient is 76 y.o. female s/p Rt TKA on 02/20/22 with PMH significant for anemia, OA, back pain, breast cancer, depression, asthma, HLD, HTN, GERD, hx of PE, cervical and lumbar fusions, Lt TKA on 01/03/22. ? ?  ?Clinical Impression ? LAKAYA TOLEN is a 76 y.o. female POD 0 s/p Rt TKA. Patient reports independence with mobility at baseline. Patient is now limited by functional impairments (see PT problem list below) and requires min assist for transfers with RW. She was limited to bed>chair transfer today due to paraesthesia and posterior weakness secondary to spinal block. Patient instructed in exercises to facilitate ROM and circulation to manage edema. Patient will benefit from continued skilled PT interventions to address impairments and progress towards PLOF. Acute PT will follow to progress mobility and stair training in preparation for safe discharge home.    ?   ? ?Recommendations for follow up therapy are one component of a multi-disciplinary discharge planning process, led by the attending physician.  Recommendations may be updated based on patient status, additional functional criteria and insurance authorization. ? ?Follow Up Recommendations Follow physician's recommendations for discharge plan and follow up therapies ? ?  ?Assistance Recommended at Discharge Intermittent Supervision/Assistance  ?Patient can return home with the following ? A little help with walking and/or transfers;Assistance with cooking/housework;Assist for transportation;A little help with bathing/dressing/bathroom;Help with stairs or ramp for entrance ? ?  ?Equipment Recommendations None recommended by PT  ?Recommendations for Other Services ?    ?  ?Functional Status Assessment Patient has had a recent decline in their functional status and demonstrates the ability to make  significant improvements in function in a reasonable and predictable amount of time.  ? ?  ?Precautions / Restrictions Precautions ?Precautions: Fall ?Restrictions ?Weight Bearing Restrictions: No ?LLE Weight Bearing: Weight bearing as tolerated  ? ?  ? ?Mobility ? Bed Mobility ?Overal bed mobility: Needs Assistance ?Bed Mobility: Supine to Sit ?  ?  ?Supine to sit: Min guard, HOB elevated ?  ?  ?General bed mobility comments: pt using bed rail to pivot, cues for sequencing and guarding for safety. ?  ? ?Transfers ?Overall transfer level: Needs assistance ?Equipment used: Rolling walker (2 wheels) ?Transfers: Sit to/from Stand, Bed to chair/wheelchair/BSC ?Sit to Stand: Min assist ?  ?Step pivot transfers: Min assist ?  ?  ?  ?General transfer comment: cues for technique with power up and assist to steady with rise. pt with slight anterior lean and compensating with back of legs against bed to steady self initially. Min assist to guide walker and cues to sequence steps to move to recliner. ?  ? ?Ambulation/Gait ?  ?  ?  ?  ?  ?  ?Pre-gait activities: able to march at EOB and weight shift without major lateral lean. ?  ? ?Stairs ?  ?  ?  ?  ?  ? ?Wheelchair Mobility ?  ? ?Modified Rankin (Stroke Patients Only) ?  ? ?  ? ?Balance Overall balance assessment: Needs assistance ?Sitting-balance support: Feet supported ?Sitting balance-Leahy Scale: Fair ?  ?  ?Standing balance support: During functional activity, Bilateral upper extremity supported, Reliant on assistive device for balance ?Standing balance-Leahy Scale: Poor ?  ?  ?  ?  ?  ?  ?  ?  ?  ?  ?  ?  ?   ? ? ? ?Pertinent Vitals/Pain  Pain Assessment ?Pain Assessment: No/denies pain ?Pain Location: left knee ?Pain Intervention(s): Monitored during session, Repositioned  ? ? ?Home Living Family/patient expects to be discharged to:: Private residence ?Living Arrangements: Spouse/significant other ?Available Help at Discharge: Family;Available 24 hours/day ?Type of  Home: House ?Home Access: Stairs to enter ?Entrance Stairs-Rails: Can reach both ?Entrance Stairs-Number of Steps: 1-2 ?  ?Home Layout: Two level;Bed/bath upstairs;Full bath on main level ?Home Equipment: Conservation officer, nature (2 wheels);Shower seat - built in;Grab bars - tub/shower;Grab bars - toilet;Cane - single point;Cane - quad ?   ?  ?Prior Function Prior Level of Function : Independent/Modified Independent ?  ?  ?  ?  ?  ?  ?Mobility Comments: was carrying cane but not really using it ?  ?  ? ? ?Hand Dominance  ? Dominant Hand: Right ? ?  ?Extremity/Trunk Assessment  ? Upper Extremity Assessment ?Upper Extremity Assessment: Overall WFL for tasks assessed ?  ? ?Lower Extremity Assessment ?Lower Extremity Assessment: Generalized weakness;RLE deficits/detail ?RLE Deficits / Details: good quad activation, no extensor lag with SLR, 4-/5 for ankle dorsi/plantar flexion bil LE. ?RLE Sensation: WNL ?RLE Coordination: WNL ?  ? ?Cervical / Trunk Assessment ?Cervical / Trunk Assessment: Normal  ?Communication  ? Communication: No difficulties  ?Cognition Arousal/Alertness: Awake/alert ?Behavior During Therapy: [facility] for tasks assessed/performed ?Overall Cognitive Status: Within Functional Limits for tasks assessed ?  ?  ?  ?  ?  ?  ?  ?  ?  ?  ?  ?  ?  ?  ?  ?  ?  ?  ?  ? ?  ?General Comments   ? ?  ?Exercises Total Joint Exercises ?Ankle Circles/Pumps: AROM, Both, 10 reps ?Quad Sets: AROM, Right, 5 reps ?Heel Slides: AAROM, Right, 5 reps  ? ?Assessment/Plan  ?  ?PT Assessment Patient needs continued PT services  ?PT Problem List Decreased range of motion;Decreased strength;Decreased activity tolerance;Decreased balance;Decreased mobility;Decreased knowledge of use of DME;Decreased knowledge of precautions;Pain ? ?   ?  ?PT Treatment Interventions DME instruction;Therapeutic activities;Gait training;Therapeutic exercise;Patient/family education;Stair training;Functional mobility training;Balance training   ? ?PT Goals (Current  goals can be found in the Care Plan section)  ?Acute Rehab PT Goals ?Patient Stated Goal: to go home ?PT Goal Formulation: With patient ?Time For Goal Achievement: 02/28/22 ?Potential to Achieve Goals: Good ? ?  ?Frequency 7X/week ?  ? ? ?Co-evaluation   ?  ?  ?  ?  ? ? ?  ?AM-PAC PT "6 Clicks" Mobility  ?Outcome Measure Help needed turning from your back to your side while in a flat bed without using bedrails?: A Little ?Help needed moving from lying on your back to sitting on the side of a flat bed without using bedrails?: A Little ?Help needed moving to and from a bed to a chair (including a wheelchair)?: A Little ?Help needed standing up from a chair using your arms (e.g., wheelchair or bedside chair)?: A Little ?Help needed to walk in hospital room?: A Lot ?Help needed climbing 3-5 steps with a railing? : A Lot ?6 Click Score: 16 ? ?  ?End of Session Equipment Utilized During Treatment: Gait belt ?Activity Tolerance: Patient tolerated treatment well ?Patient left: in chair;with call bell/phone within reach;with chair alarm set ?Nurse Communication: Mobility status ?PT Visit Diagnosis: Muscle weakness (generalized) (M62.81);Difficulty in walking, not elsewhere classified (R26.2) ?  ? ?Time: 3557-3220 ?PT Time Calculation (min) (ACUTE ONLY): 19 min ? ? ?Charges:   PT Evaluation ?$PT Eval Low Complexity:  1 Low ?  ?  ?   ? ? ?Gwynneth Albright PT, DPT ?Acute Rehabilitation Services ?Office [phone] ?Pager [phone]  ? ?Jacques Navy ?02/21/2022, 3:26 PM ? ?

## 2022-02-21 NOTE — Anesthesia Procedure Notes (Signed)
Anesthesia Regional Block: Adductor canal block  ? ?Pre-Anesthetic Checklist: , timeout performed,  Correct Patient, Correct Site, Correct Laterality,  Correct Procedure, Correct Position, site marked,  Risks and benefits discussed,  Surgical consent,  Pre-op evaluation,  At surgeon's request and post-op pain management ? ?Laterality: Right ? ?Prep: chloraprep     ?  ?Needles:  ?Injection technique: Single-shot ? ?Needle Type: Stimiplex   ? ? ?Needle Length: 9cm  ?Needle Gauge: 21  ? ? ? ?Additional Needles: ? ? ?Procedures:,,,, ultrasound used (permanent image in chart),,    ?Narrative:  ?Start time: 02/21/2022 7:09 AM ?End time: 02/21/2022 7:14 AM ?Injection made incrementally with aspirations every 5 mL. ? ?Performed by: Personally  ?Anesthesiologist: Lynda Rainwater, MD ? ? ? ? ?

## 2022-02-21 NOTE — Anesthesia Procedure Notes (Signed)
Spinal ? ?Patient location during procedure: OR ?Start time: 02/21/2022 7:35 AM ?End time: 02/21/2022 7:39 AM ?Reason for block: surgical anesthesia ?Staffing ?Performed: anesthesiologist and resident/CRNA  ?Anesthesiologist: Nolon Nations, MD ?Resident/CRNA: Garrel Ridgel, CRNA ?Preanesthetic Checklist ?Completed: patient identified, IV checked, site marked, risks and benefits discussed, surgical consent, monitors and equipment checked, pre-op evaluation and timeout performed ?Spinal Block ?Patient position: sitting ?Prep: DuraPrep ?Patient monitoring: heart rate, continuous pulse ox and blood pressure ?Location: L4-5 ?Injection technique: single-shot ?Needle ?Needle type: Sprotte and Pencan  ?Needle gauge: 24 G ?Needle length: 9 cm ?Needle insertion depth: 5 cm ?Assessment ?Sensory level: T10 ?Events: CSF return ?Additional Notes ? ? ? ? ? ? ?

## 2022-02-22 ENCOUNTER — Encounter (HOSPITAL_COMMUNITY): Payer: Self-pay | Admitting: Orthopedic Surgery

## 2022-02-22 ENCOUNTER — Other Ambulatory Visit: Payer: Self-pay

## 2022-02-22 DIAGNOSIS — Z853 Personal history of malignant neoplasm of breast: Secondary | ICD-10-CM | POA: Diagnosis not present

## 2022-02-22 DIAGNOSIS — Z79899 Other long term (current) drug therapy: Secondary | ICD-10-CM | POA: Diagnosis not present

## 2022-02-22 DIAGNOSIS — Z86711 Personal history of pulmonary embolism: Secondary | ICD-10-CM | POA: Diagnosis not present

## 2022-02-22 DIAGNOSIS — J45909 Unspecified asthma, uncomplicated: Secondary | ICD-10-CM | POA: Diagnosis not present

## 2022-02-22 DIAGNOSIS — Z7901 Long term (current) use of anticoagulants: Secondary | ICD-10-CM | POA: Diagnosis not present

## 2022-02-22 DIAGNOSIS — I10 Essential (primary) hypertension: Secondary | ICD-10-CM | POA: Diagnosis not present

## 2022-02-22 DIAGNOSIS — Z96652 Presence of left artificial knee joint: Secondary | ICD-10-CM | POA: Diagnosis not present

## 2022-02-22 DIAGNOSIS — Z791 Long term (current) use of non-steroidal anti-inflammatories (NSAID): Secondary | ICD-10-CM | POA: Diagnosis not present

## 2022-02-22 DIAGNOSIS — M1711 Unilateral primary osteoarthritis, right knee: Secondary | ICD-10-CM | POA: Diagnosis not present

## 2022-02-22 LAB — BASIC METABOLIC PANEL
Anion gap: 9 (ref 5–15)
BUN: 14 mg/dL (ref 8–23)
CO2: 26 mmol/L (ref 22–32)
Calcium: 8.7 mg/dL — ABNORMAL LOW (ref 8.9–10.3)
Chloride: 101 mmol/L (ref 98–111)
Creatinine, Ser: 0.62 mg/dL (ref 0.44–1.00)
GFR, Estimated: >60 mL/min (ref >60)
Glucose, Bld: 133 mg/dL — ABNORMAL HIGH (ref 70–99)
Potassium: 3.2 mmol/L — ABNORMAL LOW (ref 3.5–5.1)
Sodium: 136 mmol/L (ref 135–145)

## 2022-02-22 LAB — CBC
HCT: 26.8 % — ABNORMAL LOW (ref 36.0–46.0)
Hemoglobin: 8.6 g/dL — ABNORMAL LOW (ref 12.0–15.0)
MCH: 27.7 pg (ref 26.0–34.0)
MCHC: 32.1 g/dL (ref 30.0–36.0)
MCV: 86.2 fL (ref 80.0–100.0)
Platelets: 215 10*3/uL (ref 150–400)
RBC: 3.11 MIL/uL — ABNORMAL LOW (ref 3.87–5.11)
RDW: 14.6 % (ref 11.5–15.5)
WBC: 9.2 10*3/uL (ref 4.0–10.5)
nRBC: 0 % (ref 0.0–0.2)

## 2022-02-22 MED ORDER — METHOCARBAMOL 500 MG PO TABS: 500.0000 mg | ORAL_TABLET | Freq: Four times a day (QID) | ORAL | 0 refills | Status: DC | PRN

## 2022-02-22 MED ORDER — POTASSIUM CHLORIDE CRYS ER 20 MEQ PO TBCR
40.0000 meq | EXTENDED_RELEASE_TABLET | Freq: Once | ORAL | Status: AC
  Administered 2022-02-22: 40 meq via ORAL
  Filled 2022-02-22: qty 2

## 2022-02-22 MED ORDER — HYDROCODONE-ACETAMINOPHEN 7.5-325 MG PO TABS: 1.0000 | ORAL_TABLET | ORAL | 0 refills | Status: DC | PRN

## 2022-02-22 NOTE — Plan of Care (Signed)
Plan of care reviewed and discussed with the patient. 

## 2022-02-22 NOTE — Progress Notes (Signed)
Physical Therapy Treatment ?Patient Details ?Name: Bonnie Weeks ?MRN: [id number] ?DOB: July 31, 1946 ?Today's Date: 02/22/2022 ? ? ?History of Present Illness Patient is 76 y.o. female s/p Rt TKA on 02/20/22 with PMH significant for anemia, OA, back pain, breast cancer, depression, asthma, HLD, HTN, GERD, hx of PE, cervical and lumbar fusions, Lt TKA on 01/03/22. ? ?  ?PT Comments  ? ? Patient making steady progress with mobility and ambulated 23' with RW and min guard for safety. Patient limited by c/o pain at Lt knee and deferred stair mobility this session. Instructed in exercises for ROM and circulation. Patient reports family coming in later today, will follow up for additional PT session including family training.  ? ?   ?Recommendations for follow up therapy are one component of a multi-disciplinary discharge planning process, led by the attending physician.  Recommendations may be updated based on patient status, additional functional criteria and insurance authorization. ? ?Follow Up Recommendations ? Follow physician's recommendations for discharge plan and follow up therapies ?  ?  ?Assistance Recommended at Discharge Intermittent Supervision/Assistance  ?Patient can return home with the following A little help with walking and/or transfers;Assistance with cooking/housework;Assist for transportation;A little help with bathing/dressing/bathroom;Help with stairs or ramp for entrance ?  ?Equipment Recommendations ? None recommended by PT  ?  ?Recommendations for Other Services   ? ? ?  ?Precautions / Restrictions Precautions ?Precautions: Fall ?Restrictions ?Weight Bearing Restrictions: No ?LLE Weight Bearing: Weight bearing as tolerated  ?  ? ?Mobility ? Bed Mobility ?  ?  ?  ?  ?  ?  ?  ?General bed mobility comments: pt OOB in recliner ?  ? ?Transfers ?Overall transfer level: Needs assistance ?Equipment used: Rolling walker (2 wheels) ?Transfers: Sit to/from Stand ?Sit to Stand: Min guard ?  ?  ?  ?  ?  ?General  transfer comment: guarding for rise, cues for bil UE use on armrest to power up and cues to reach back anc control lowering. ?  ? ?Ambulation/Gait ?Ambulation/Gait assistance: Min guard ?Gait Distance (Feet): 60 Feet ?Assistive device: Rolling walker (2 wheels) ?Gait Pattern/deviations: Step-to pattern, Antalgic, Decreased stride length, Decreased weight shift to right ?Gait velocity: decr ?  ?  ?General Gait Details: cues for step pattern and proximity to RW, pt steady overall with no LOB or buckling at Rt knee. pt c/o increased pain with ambultion. ? ? ?Stairs ?  ?  ?  ?  ?  ? ? ?Wheelchair Mobility ?  ? ?Modified Rankin (Stroke Patients Only) ?  ? ? ?  ?Balance Overall balance assessment: Needs assistance ?Sitting-balance support: Feet supported ?Sitting balance-Leahy Scale: Fair ?  ?  ?Standing balance support: During functional activity, Bilateral upper extremity supported, Reliant on assistive device for balance ?Standing balance-Leahy Scale: Poor ?  ?  ?  ?  ?  ?  ?  ?  ?  ?  ?  ?  ?  ? ?  ?Cognition Arousal/Alertness: Awake/alert ?Behavior During Therapy: [facility] for tasks assessed/performed ?Overall Cognitive Status: Within Functional Limits for tasks assessed ?  ?  ?  ?  ?  ?  ?  ?  ?  ?  ?  ?  ?  ?  ?  ?  ?  ?  ?  ? ?  ?Exercises Total Joint Exercises ?Ankle Circles/Pumps: AROM, Both, 15 reps ?Quad Sets: AROM, Right, 10 reps ?Heel Slides: AAROM, Right, 10 reps ?Hip ABduction/ADduction: AROM, Right, 10 reps ? ?  ?General Comments   ?  ?  ? ?  Pertinent Vitals/Pain Pain Assessment ?Pain Assessment: Faces ?Faces Pain Scale: Hurts whole lot ?Pain Location: left knee ?Pain Descriptors / Indicators: Aching, Discomfort, Sore ?Pain Intervention(s): Limited activity within patient's tolerance, Monitored during session, Premedicated before session, Repositioned  ? ? ?Home Living   ?  ?  ?  ?  ?  ?  ?  ?  ?  ?   ?  ?Prior Function    ?  ?  ?   ? ?PT Goals (current goals can now be found in the care plan section) Acute  Rehab PT Goals ?Patient Stated Goal: to go home ?PT Goal Formulation: With patient ?Time For Goal Achievement: 02/28/22 ?Potential to Achieve Goals: Good ?Progress towards PT goals: Progressing toward goals ? ?  ?Frequency ? ? ? 7X/week ? ? ? ?  ?PT Plan Current plan remains appropriate  ? ? ?Co-evaluation   ?  ?  ?  ?  ? ?  ?AM-PAC PT "6 Clicks" Mobility   ?Outcome Measure ? Help needed turning from your back to your side while in a flat bed without using bedrails?: A Little ?Help needed moving from lying on your back to sitting on the side of a flat bed without using bedrails?: A Little ?Help needed moving to and from a bed to a chair (including a wheelchair)?: A Little ?Help needed standing up from a chair using your arms (e.g., wheelchair or bedside chair)?: A Little ?Help needed to walk in hospital room?: A Little ?Help needed climbing 3-5 steps with a railing? : A Little ?6 Click Score: 18 ? ?  ?End of Session Equipment Utilized During Treatment: Gait belt ?Activity Tolerance: Patient tolerated treatment well ?Patient left: in chair;with call bell/phone within reach;with chair alarm set ?Nurse Communication: Mobility status ?PT Visit Diagnosis: Muscle weakness (generalized) (M62.81);Difficulty in walking, not elsewhere classified (R26.2) ?  ? ? ?Time: 4163-8453 ?PT Time Calculation (min) (ACUTE ONLY): 26 min ? ?Charges:  $Gait Training: 8-22 mins ?$Therapeutic Exercise: 8-22 mins          ?          ? ?Gwynneth Albright PT, DPT ?Acute Rehabilitation Services ?Office [phone] ?Pager [phone]  ? ? ?Jacques Navy ?02/22/2022, 12:24 PM ? ?

## 2022-02-22 NOTE — Progress Notes (Signed)
? ?Subjective: ?1 Day Post-Op Procedure(s) (LRB): ?TOTAL KNEE ARTHROPLASTY (Right) ?Patient reports pain as mild.   ?Patient seen in rounds by Dr. Alvan Dame. ?Patient is well, and has had no acute complaints or problems. No acute events overnight. She reports her headache improved with Toradol yesterday. Foley catheter removed. Patient ambulated a few feet with PT.  ?We will start therapy today.  ? ?Objective: ?Vital signs in last 24 hours: ?Temp:  [97.6 ?F (36.4 ?C)-98.2 ?F (36.8 ?C)] 98 ?F (36.7 ?C) (04/05 0518) ?Pulse Rate:  [64-76] 65 (04/05 0518) ?Resp:  [15-18] 17 (04/05 0518) ?BP: (88-118)/(47-69) 111/52 (04/05 0518) ?SpO2:  [93 %-100 %] 94 % (04/05 0518) ?Weight:  [84 kg] 84 kg (04/05 0518) ? ?Intake/Output from previous day: ? ?Intake/Output Summary (Last 24 hours) at 02/22/2022 0732 ?Last data filed at 02/22/2022 0600 ?Gross per 24 hour  ?Intake 3467.32 ml  ?Output 2940 ml  ?Net 527.32 ml  ?  ? ?Intake/Output this shift: ?No intake/output data recorded. ? ?Labs: ?Recent Labs  ?  02/22/22 ?0312  ?HGB 8.6*  ? ?Recent Labs  ?  02/22/22 ?0312  ?WBC 9.2  ?RBC 3.11*  ?HCT 26.8*  ?PLT 215  ? ?Recent Labs  ?  02/22/22 ?0312  ?NA 136  ?K 3.2*  ?CL 101  ?CO2 26  ?BUN 14  ?CREATININE 0.62  ?GLUCOSE 133*  ?CALCIUM 8.7*  ? ?Recent Labs  ?  02/21/22 ?0600  ?INR 0.9  ? ? ?Exam: ?General - Patient is Alert and Oriented ?Extremity - Neurologically intact ?Sensation intact distally ?Intact pulses distally ?Dorsiflexion/Plantar flexion intact ?Dressing - dressing C/D/I ?Motor Function - intact, moving foot and toes well on exam.  ? ?Past Medical History:  ?Diagnosis Date  ? Anemia   ? Aortic stenosis   ? mild by 06/27/21 echo  ? Arthritis   ? Asthma   ? Back pain   ? Cancer [facility])   ? right breast cancer  ? Depression   ? Dyspnea   ? GERD (gastroesophageal reflux disease)   ? Headache   ? History of hiatal hernia   ? Hyperlipidemia   ? Hypertension   ? Pneumonia   ? 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 ? ?Assessment/Plan: ?1 Day Post-Op Procedure(s) (LRB): ?TOTAL KNEE ARTHROPLASTY (Right) ?Principal Problem: ?  S/P total knee arthroplasty, right ? ?Estimated body mass index is 32.8 kg/m? as calculated from the following: ?  Height as of this encounter: '5\' 3"'$  (1.6 m). ?  Weight as of this encounter: 84 kg. ?Advance diet ?Up with therapy ?D/C IV fluids ? ? ?Patient's anticipated LOS is less than 2 midnights, meeting these requirements: ?- Younger than 33 ?- Lives within 1 hour of care ?- Has a competent adult at home to recover with post-op recover ?- NO history of ? - Chronic pain requiring opiods ? - Diabetes ? - Coronary Artery Disease ? - Heart failure ? - Heart attack ? - Stroke ? - DVT/VTE ? - Cardiac arrhythmia ? - Respiratory Failure/COPD ? - Renal failure ? - Anemia ? - Advanced Liver disease ? ?  ? ?DVT Prophylaxis - Xarelto ?Weight bearing as tolerated. ? ?Headache improved this morning. Appreciate anesthesia evaluating yesterday. ? ?Patient has significant lumbar history including chronic right leg radiculopathy, and has a Neurosurgeon who follows her regularly. We will need to get her back in to see them vs transferring to our office with one of our back specialists.  Will send with prednisone dose pack.  ? ?Plan is to go Home after hospital stay. Plan for discharge today following 1-2 sessions of PT as long as they are meeting their goals. Patient is scheduled for OPPT. Follow up in the office in 2 weeks.  ? ?Griffith Citron, PA-C ?Orthopedic Surgery ?[phone] ?02/22/2022, 7:32 AM  ?

## 2022-02-22 NOTE — Progress Notes (Signed)
Physical Therapy Treatment ?Patient Details ?Name: Bonnie Weeks ?MRN: [id number] ?DOB: 1946-03-21 ?Today's Date: 02/22/2022 ? ? ?History of Present Illness Patient is 76 y.o. female s/p Rt TKA on 02/20/22 with PMH significant for anemia, OA, back pain, breast cancer, depression, asthma, HLD, HTN, GERD, hx of PE, cervical and lumbar fusions, Lt TKA on 01/03/22. ? ?  ?PT Comments  ? ? Patient making good progress with mobility and family present this session for training. Pt ambulated ~150' with RW and demonstrated safe management of walker with transfers and gait. Patient completed stair mobility with family providing safe guarding and no LOB noted. Reviewed HEP at [facility] and addressed questions regarding discharge. Patient is mobilizing at safe level to return home with assist from family but has expressed concerns regarding pain management. Will continue to progress pt in acute setting.  ? ?  ?Recommendations for follow up therapy are one component of a multi-disciplinary discharge planning process, led by the attending physician.  Recommendations may be updated based on patient status, additional functional criteria and insurance authorization. ? ?Follow Up Recommendations ? Follow physician's recommendations for discharge plan and follow up therapies ?  ?  ?Assistance Recommended at Discharge Intermittent Supervision/Assistance  ?Patient can return home with the following A little help with walking and/or transfers;Assistance with cooking/housework;Assist for transportation;A little help with bathing/dressing/bathroom;Help with stairs or ramp for entrance ?  ?Equipment Recommendations ? None recommended by PT  ?  ?Recommendations for Other Services   ? ? ?  ?Precautions / Restrictions Precautions ?Precautions: Fall ?Restrictions ?Weight Bearing Restrictions: No ?LLE Weight Bearing: Weight bearing as tolerated  ?  ? ?Mobility ? Bed Mobility ?  ?  ?  ?  ?  ?  ?  ?General bed mobility comments: pt OOB in recliner ?   ? ?Transfers ?Overall transfer level: Needs assistance ?Equipment used: Rolling walker (2 wheels) ?Transfers: Sit to/from Stand ?Sit to Stand: Supervision ?  ?  ?  ?  ?  ?General transfer comment: sueprvision for rise from recliner and toilet. pt with good carryover for safe hand placement with power up. no assist or cues needed. ?  ? ?Ambulation/Gait ?Ambulation/Gait assistance: Min guard, Supervision ?Gait Distance (Feet): 150 Feet ?Assistive device: Rolling walker (2 wheels) ?Gait Pattern/deviations: Step-to pattern, Antalgic, Decreased stride length, Decreased weight shift to right ?Gait velocity: decr ?  ?  ?General Gait Details: pt demonstrated good recall for safe step pattern and proximity to RW with minimal cues. pt's spouse present and provided safe guarding for gait with supervision from therapist. ? ? ?Stairs ?Stairs: Yes ?Stairs assistance: Min guard, Supervision ?Stair Management: Two rails, Step to pattern, Forwards ?Number of Stairs: 3 ?General stair comments: cues for sequencing, "up with good, down with bad". pt's sister and husband provided guarding and assist with cues and supervision from therapist. ? ? ?Wheelchair Mobility ?  ? ?Modified Rankin (Stroke Patients Only) ?  ? ? ?  ?Balance Overall balance assessment: Needs assistance ?Sitting-balance support: Feet supported ?Sitting balance-Leahy Scale: Fair ?  ?  ?Standing balance support: During functional activity, Bilateral upper extremity supported, Reliant on assistive device for balance ?Standing balance-Leahy Scale: Poor ?  ?  ?  ?  ?  ?  ?  ?  ?  ?  ?  ?  ?  ? ?  ?Cognition Arousal/Alertness: Awake/alert ?Behavior During Therapy: [facility] for tasks assessed/performed ?Overall Cognitive Status: Within Functional Limits for tasks assessed ?  ?  ?  ?  ?  ?  ?  ?  ?  ?  ?  ?  ?  ?  ?  ?  ?  ?  ?  ? ?  ?  Exercises Total Joint Exercises ?Ankle Circles/Pumps: AROM, Both, 15 reps ?Quad Sets: AROM, Left, 10 reps, Seated ?Short Arc Quad: AROM, Left, 10  reps, Seated ?Heel Slides: AAROM, Right, 5 reps ?Hip ABduction/ADduction: AROM, Right, 5 reps ? ?  ?General Comments   ?  ?  ? ?Pertinent Vitals/Pain Pain Assessment ?Pain Assessment: Faces ?Faces Pain Scale: Hurts even more ?Pain Location: left knee ?Pain Descriptors / Indicators: Aching, Discomfort, Sore ?Pain Intervention(s): Limited activity within patient's tolerance, Monitored during session, Premedicated before session, Repositioned, Ice applied  ? ? ?Home Living   ?  ?  ?  ?  ?  ?  ?  ?  ?  ?   ?  ?Prior Function    ?  ?  ?   ? ?PT Goals (current goals can now be found in the care plan section) Acute Rehab PT Goals ?Patient Stated Goal: to go home ?PT Goal Formulation: With patient ?Time For Goal Achievement: 02/28/22 ?Potential to Achieve Goals: Good ?Progress towards PT goals: Progressing toward goals ? ?  ?Frequency ? ? ? 7X/week ? ? ? ?  ?PT Plan Current plan remains appropriate  ? ? ?Co-evaluation   ?  ?  ?  ?  ? ?  ?AM-PAC PT "6 Clicks" Mobility   ?Outcome Measure ? Help needed turning from your back to your side while in a flat bed without using bedrails?: A Little ?Help needed moving from lying on your back to sitting on the side of a flat bed without using bedrails?: A Little ?Help needed moving to and from a bed to a chair (including a wheelchair)?: A Little ?Help needed standing up from a chair using your arms (e.g., wheelchair or bedside chair)?: A Little ?Help needed to walk in hospital room?: A Little ?Help needed climbing 3-5 steps with a railing? : A Little ?6 Click Score: 18 ? ?  ?End of Session Equipment Utilized During Treatment: Gait belt ?Activity Tolerance: Patient tolerated treatment well ?Patient left: in chair;with call bell/phone within reach;with chair alarm set ?Nurse Communication: Mobility status ?PT Visit Diagnosis: Muscle weakness (generalized) (M62.81);Difficulty in walking, not elsewhere classified (R26.2) ?  ? ? ?Time: 1749-4496 ?PT Time Calculation (min) (ACUTE ONLY): 29  min ? ?Charges:  $Gait Training: 8-22 mins ?$Therapeutic Exercise: 8-22 mins          ?          ? ?Gwynneth Albright PT, DPT ?Acute Rehabilitation Services ?Office [phone] ?Pager [phone]  ? ? ?Jacques Navy ?02/22/2022, 2:39 PM ? ?

## 2022-02-22 NOTE — TOC Transition Note (Signed)
Transition of Care (TOC) - CM/SW Discharge Note ? ?Patient Details  ?Name: Bonnie Weeks ?MRN: [id number] ?Date of Birth: 07/08/1946 ? ?Transition of Care (TOC) CM/SW Contact:  ?Sherie Don, LCSW ?Phone Number: ?02/22/2022, 12:28 PM ? ?Clinical Narrative: Patient is expected to discharge home after working with PT. CSW met with patient to confirm discharge plan. Patient will discharge home with OPPT at [facility]. Patient has a rolling walker and 3N1 at home, so there are no DME needs at this time. TOC signing off. ? ?Barriers to Discharge: No Barriers Identified ? ?Patient Goals and CMS Choice ?Patient states their goals for this hospitalization and ongoing recovery are:: Discharge home with OPPT at [facility] ?Choice offered to / list presented to : NA ? ?Discharge Plan and Services         ?DME Arranged: N/A ?DME Agency: NA ? ?Readmission Risk Interventions ?   ? View : No data to display.  ?  ?  ?  ? ?

## 2022-02-23 DIAGNOSIS — Z791 Long term (current) use of non-steroidal anti-inflammatories (NSAID): Secondary | ICD-10-CM | POA: Diagnosis not present

## 2022-02-23 DIAGNOSIS — Z79899 Other long term (current) drug therapy: Secondary | ICD-10-CM | POA: Diagnosis not present

## 2022-02-23 DIAGNOSIS — Z86711 Personal history of pulmonary embolism: Secondary | ICD-10-CM | POA: Diagnosis not present

## 2022-02-23 DIAGNOSIS — M1711 Unilateral primary osteoarthritis, right knee: Secondary | ICD-10-CM | POA: Diagnosis not present

## 2022-02-23 DIAGNOSIS — J45909 Unspecified asthma, uncomplicated: Secondary | ICD-10-CM | POA: Diagnosis not present

## 2022-02-23 DIAGNOSIS — Z7901 Long term (current) use of anticoagulants: Secondary | ICD-10-CM | POA: Diagnosis not present

## 2022-02-23 DIAGNOSIS — I10 Essential (primary) hypertension: Secondary | ICD-10-CM | POA: Diagnosis not present

## 2022-02-23 DIAGNOSIS — Z96652 Presence of left artificial knee joint: Secondary | ICD-10-CM | POA: Diagnosis not present

## 2022-02-23 DIAGNOSIS — Z853 Personal history of malignant neoplasm of breast: Secondary | ICD-10-CM | POA: Diagnosis not present

## 2022-02-23 LAB — CBC
HCT: 27.4 % — ABNORMAL LOW (ref 36.0–46.0)
Hemoglobin: 9.1 g/dL — ABNORMAL LOW (ref 12.0–15.0)
MCH: 27.9 pg (ref 26.0–34.0)
MCHC: 33.2 g/dL (ref 30.0–36.0)
MCV: 84 fL (ref 80.0–100.0)
Platelets: 229 10*3/uL (ref 150–400)
RBC: 3.26 MIL/uL — ABNORMAL LOW (ref 3.87–5.11)
RDW: 14.7 % (ref 11.5–15.5)
WBC: 9.5 10*3/uL (ref 4.0–10.5)
nRBC: 0 % (ref 0.0–0.2)

## 2022-02-23 LAB — BASIC METABOLIC PANEL
Anion gap: 7 (ref 5–15)
BUN: 14 mg/dL (ref 8–23)
CO2: 29 mmol/L (ref 22–32)
Calcium: 9.4 mg/dL (ref 8.9–10.3)
Chloride: 103 mmol/L (ref 98–111)
Creatinine, Ser: 0.6 mg/dL (ref 0.44–1.00)
GFR, Estimated: >60 mL/min (ref >60)
Glucose, Bld: 114 mg/dL — ABNORMAL HIGH (ref 70–99)
Potassium: 3.8 mmol/L (ref 3.5–5.1)
Sodium: 139 mmol/L (ref 135–145)

## 2022-02-23 NOTE — Plan of Care (Signed)
Pt ready to DC home. Waiting on family for a ride. ?

## 2022-02-23 NOTE — Progress Notes (Signed)
Physical Therapy Treatment ?Patient Details ?Name: Bonnie Weeks ?MRN: [id number] ?DOB: Jun 07, 1946 ?Today's Date: 02/23/2022 ? ? ?History of Present Illness Patient is 76 y.o. female s/p Rt TKA on 02/20/22 with PMH significant for anemia, OA, back pain, breast cancer, depression, asthma, HLD, HTN, GERD, hx of PE, cervical and lumbar fusions, Lt TKA on 01/03/22. ? ?  ?PT Comments  ? ? Patient progressing well with mobility. Good recall for safe management of RW for transfers and gait, no LOB noted throughout. [facility] reviewed pt's exercises for ROM, strength, and circulation. Addressed all questions for pt's plan with therapy once discharged home. She is mobilizing at safe level to return home with assist from family. Acute PT will progress pt throughout her stay. ? ?  ?Recommendations for follow up therapy are one component of a multi-disciplinary discharge planning process, led by the attending physician.  Recommendations may be updated based on patient status, additional functional criteria and insurance authorization. ? ?Follow Up Recommendations ? Follow physician's recommendations for discharge plan and follow up therapies ?  ?  ?Assistance Recommended at Discharge Intermittent Supervision/Assistance  ?Patient can return home with the following A little help with walking and/or transfers;Assistance with cooking/housework;Assist for transportation;A little help with bathing/dressing/bathroom;Help with stairs or ramp for entrance ?  ?Equipment Recommendations ? None recommended by PT  ?  ?Recommendations for Other Services   ? ? ?  ?Precautions / Restrictions Precautions ?Precautions: Fall ?Restrictions ?Weight Bearing Restrictions: No ?LLE Weight Bearing: Weight bearing as tolerated  ?  ? ?Mobility ? Bed Mobility ?  ?  ?  ?  ?  ?  ?  ?General bed mobility comments: pt OOB in recliner ?  ? ?Transfers ?Overall transfer level: Needs assistance ?Equipment used: Rolling walker (2 wheels) ?Transfers: Sit to/from Stand ?Sit to  Stand: Supervision ?  ?  ?  ?  ?  ?General transfer comment: supervision for transfer from recliner, no cues or assist ?  ? ?Ambulation/Gait ?Ambulation/Gait assistance: Supervision ?Gait Distance (Feet): 120 Feet ?Assistive device: Rolling walker (2 wheels) ?Gait Pattern/deviations: Step-to pattern, Antalgic, Decreased stride length, Decreased weight shift to right ?Gait velocity: decr ?  ?  ?General Gait Details: pt dmeonstrates safe management of RW and good recall for step pattern. no LOB noted throughout. ? ? ?Stairs ?  ?  ?  ?  ?  ? ? ?Wheelchair Mobility ?  ? ?Modified Rankin (Stroke Patients Only) ?  ? ? ?  ?Balance Overall balance assessment: Needs assistance ?Sitting-balance support: Feet supported ?Sitting balance-Leahy Scale: Fair ?  ?  ?Standing balance support: During functional activity, Bilateral upper extremity supported, Reliant on assistive device for balance ?Standing balance-Leahy Scale: Poor ?  ?  ?  ?  ?  ?  ?  ?  ?  ?  ?  ?  ?  ? ?  ?Cognition Arousal/Alertness: Awake/alert ?Behavior During Therapy: [facility] for tasks assessed/performed ?Overall Cognitive Status: Within Functional Limits for tasks assessed ?  ?  ?  ?  ?  ?  ?  ?  ?  ?  ?  ?  ?  ?  ?  ?  ?  ?  ?  ? ?  ?Exercises Total Joint Exercises ?Ankle Circles/Pumps: AROM, Both, 10 reps ?Quad Sets: AROM, Right, 5 reps ?Short Arc Quad: AROM, Right, 5 reps ?Heel Slides: AAROM, Right, 5 reps ?Hip ABduction/ADduction: AROM, Right, 5 reps ?Straight Leg Raises: AROM, Right, Other reps (comment) (3) ?Long Arc Quad: AROM, Right, Other reps (  comment) (2) ?Knee Flexion: AROM, Right, Other reps (comment), AAROM (2) ? ?  ?General Comments   ?  ?  ? ?Pertinent Vitals/Pain Pain Assessment ?Pain Assessment: 0-10 ?Pain Score: 4  ?Pain Location: left knee ?Pain Descriptors / Indicators: Aching, Discomfort, Sore ?Pain Intervention(s): Limited activity within patient's tolerance, Monitored during session, Repositioned, Premedicated before session  ? ? ?Home  Living   ?  ?  ?  ?  ?  ?  ?  ?  ?  ?   ?  ?Prior Function    ?  ?  ?   ? ?PT Goals (current goals can now be found in the care plan section) Acute Rehab PT Goals ?Patient Stated Goal: to go home ?PT Goal Formulation: With patient ?Time For Goal Achievement: 02/28/22 ?Potential to Achieve Goals: Good ?Progress towards PT goals: Progressing toward goals ? ?  ?Frequency ? ? ? 7X/week ? ? ? ?  ?PT Plan Current plan remains appropriate  ? ? ?Co-evaluation   ?  ?  ?  ?  ? ?  ?AM-PAC PT "6 Clicks" Mobility   ?Outcome Measure ? Help needed turning from your back to your side while in a flat bed without using bedrails?: A Little ?Help needed moving from lying on your back to sitting on the side of a flat bed without using bedrails?: A Little ?Help needed moving to and from a bed to a chair (including a wheelchair)?: A Little ?Help needed standing up from a chair using your arms (e.g., wheelchair or bedside chair)?: A Little ?Help needed to walk in hospital room?: A Little ?Help needed climbing 3-5 steps with a railing? : A Little ?6 Click Score: 18 ? ?  ?End of Session Equipment Utilized During Treatment: Gait belt ?Activity Tolerance: Patient tolerated treatment well ?Patient left: in chair;with call bell/phone within reach;with chair alarm set ?Nurse Communication: Mobility status ?PT Visit Diagnosis: Muscle weakness (generalized) (M62.81);Difficulty in walking, not elsewhere classified (R26.2) ?  ? ? ?Time: 0630-1601 ?PT Time Calculation (min) (ACUTE ONLY): 19 min ? ?Charges:  $Therapeutic Exercise: 8-22 mins          ?          ? ?Gwynneth Albright PT, DPT ?Acute Rehabilitation Services ?Office [phone] ?Pager [phone]  ? ? ?Jacques Navy ?02/23/2022, 12:29 PM ? ?

## 2022-02-23 NOTE — Progress Notes (Signed)
? ?Subjective: ?2 Days Post-Op Procedure(s) (LRB): ?TOTAL KNEE ARTHROPLASTY (Right) ?Patient reports pain as mild.   ?Patient seen in rounds for Dr. Alvan Dame. ?Patient is resting in bed on exam this morning. She reports currently at rest she has no pain in her back, legs, or knee. She says she had no trouble getting up and moving in terms of her knee, but her back was the limiting factor yesterday. We again discussed follow up, and she does wish to transfer to Dr. Nelva Bush to have all of her care in our practice.  ?We will continue therapy today.  ? ?Objective: ?Vital signs in last 24 hours: ?Temp:  [97.9 ?F (36.6 ?C)-98.8 ?F (37.1 ?C)] 97.9 ?F (36.6 ?C) (04/06 0534) ?Pulse Rate:  [65-75] 72 (04/06 0534) ?Resp:  [14-18] 14 (04/06 0534) ?BP: (113-178)/(47-66) 120/62 (04/06 0534) ?SpO2:  [93 %-97 %] 97 % (04/06 0534) ? ?Intake/Output from previous day: ? ?Intake/Output Summary (Last 24 hours) at 02/23/2022 0739 ?Last data filed at 02/22/2022 1853 ?Gross per 24 hour  ?Intake 567.45 ml  ?Output 150 ml  ?Net 417.45 ml  ?  ? ?Intake/Output this shift: ?No intake/output data recorded. ? ?Labs: ?Recent Labs  ?  02/22/22 ?0312 02/23/22 ?0304  ?HGB 8.6* 9.1*  ? ?Recent Labs  ?  02/22/22 ?0312 02/23/22 ?0304  ?WBC 9.2 9.5  ?RBC 3.11* 3.26*  ?HCT 26.8* 27.4*  ?PLT 215 229  ? ?Recent Labs  ?  02/22/22 ?0312  ?NA 136  ?K 3.2*  ?CL 101  ?CO2 26  ?BUN 14  ?CREATININE 0.62  ?GLUCOSE 133*  ?CALCIUM 8.7*  ? ?Recent Labs  ?  02/21/22 ?0600  ?INR 0.9  ? ? ?Exam: ?General - Patient is Alert and Oriented ?Extremity - Neurologically intact ?Sensation intact distally ?Intact pulses distally ?Dorsiflexion/Plantar flexion intact ?Dressing - dressing C/D/I ?Motor Function - intact, moving foot and toes well on exam.  ? ?Past Medical History:  ?Diagnosis Date  ? Anemia   ? Aortic stenosis   ? mild by 06/27/21 echo  ? Arthritis   ? Asthma   ? Back pain   ? Cancer [facility])   ? right breast cancer  ? Depression   ? Dyspnea   ? GERD (gastroesophageal reflux  disease)   ? Headache   ? History of hiatal hernia   ? Hyperlipidemia   ? Hypertension   ? Pneumonia   ? 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 ? ?Assessment/Plan: ?2 Days Post-Op Procedure(s) (LRB): ?TOTAL KNEE ARTHROPLASTY (Right) ?Principal Problem: ?  S/P total knee arthroplasty, right ? ?Estimated body mass index is 32.8 kg/m? as calculated from the following: ?  Height as of this encounter: '5\' 3"'$  (1.6 m). ?  Weight as of this encounter: 84 kg. ?Advance diet ?Up with therapy ?D/C IV fluids ? ?Anticipated LOS equal to or greater than 2 midnights due to ?- Age 74 and older with one or more of the following: ? - Obesity ? - Expected need for hospital services (PT, OT, Nursing) required for safe  discharge ? - Anticipated need for postoperative skilled nursing care or inpatient rehab ? - Active co-morbidities: None ?OR  ? ?- Unanticipated findings during/Post Surgery: Slow post-op progression: GI, pain control, mobility  ?- Patient is a high risk of re-admission due to: None  ? ?DVT Prophylaxis - Xarelto ?Weight bearing as tolerated. ? ?Hgb stable at 9.1 this AM. ? ?Plan is to go Home after hospital stay.  Plan for discharge today following 1-2 sessions of PT as long as they are meeting their goals. Patient is scheduled for OPPT. Follow up in the office in 2 weeks.  ? ?Griffith Citron, PA-C ?Orthopedic Surgery ?[phone] ?02/23/2022, 7:39 AM  ?

## 2022-02-24 DIAGNOSIS — Z471 Aftercare following joint replacement surgery: Secondary | ICD-10-CM | POA: Diagnosis not present

## 2022-02-24 DIAGNOSIS — Z791 Long term (current) use of non-steroidal anti-inflammatories (NSAID): Secondary | ICD-10-CM | POA: Diagnosis not present

## 2022-02-24 DIAGNOSIS — M1711 Unilateral primary osteoarthritis, right knee: Secondary | ICD-10-CM | POA: Diagnosis not present

## 2022-02-24 DIAGNOSIS — I890 Lymphedema, not elsewhere classified: Secondary | ICD-10-CM | POA: Diagnosis not present

## 2022-02-24 DIAGNOSIS — M25561 Pain in right knee: Secondary | ICD-10-CM | POA: Diagnosis not present

## 2022-02-24 DIAGNOSIS — Z96651 Presence of right artificial knee joint: Secondary | ICD-10-CM | POA: Diagnosis not present

## 2022-02-24 DIAGNOSIS — Z79899 Other long term (current) drug therapy: Secondary | ICD-10-CM | POA: Diagnosis not present

## 2022-02-24 DIAGNOSIS — C50919 Malignant neoplasm of unspecified site of unspecified female breast: Secondary | ICD-10-CM | POA: Diagnosis not present

## 2022-02-24 DIAGNOSIS — Z7901 Long term (current) use of anticoagulants: Secondary | ICD-10-CM | POA: Diagnosis not present

## 2022-02-27 DIAGNOSIS — M25561 Pain in right knee: Secondary | ICD-10-CM | POA: Diagnosis not present

## 2022-02-27 DIAGNOSIS — Z96651 Presence of right artificial knee joint: Secondary | ICD-10-CM | POA: Diagnosis not present

## 2022-03-01 DIAGNOSIS — M25561 Pain in right knee: Secondary | ICD-10-CM | POA: Diagnosis not present

## 2022-03-01 DIAGNOSIS — Z96651 Presence of right artificial knee joint: Secondary | ICD-10-CM | POA: Diagnosis not present

## 2022-03-02 NOTE — Discharge Summary (Signed)
Physician Discharge Summary  ? ?Patient ID: ?Bonnie Weeks ?MRN: [id number] ?DOB/AGE: 1946-01-15 76 y.o. ? ?Admit date: 02/21/2022 ?Discharge date: 02/23/2022 ? ?Primary Diagnosis:  Right knee osteoarthritis.  ? ?Admission Diagnoses:  ?Past Medical History:  ?Diagnosis Date  ? Anemia   ? Aortic stenosis   ? mild by 06/27/21 echo  ? Arthritis   ? Asthma   ? Back pain   ? [facility])   ? right breast cancer  ? Depression   ? Dyspnea   ? GERD (gastroesophageal reflux disease)   ? Headache   ? History of hiatal hernia   ? Hyperlipidemia   ? Hypertension   ? Pneumonia   ? 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 ?Discharge Diagnoses:   ?Principal Problem: ?  S/P total knee arthroplasty, right ? ?Estimated body mass index is 32.8 kg/m? as calculated from the following: ?  Height as of this encounter: '5\' 3"'$  (1.6 m). ?  Weight as of this encounter: 84 kg. ? ?Procedure:  ?Procedure(s) (LRB): ?TOTAL KNEE ARTHROPLASTY (Right)  ? ?Consults: None ? ?HPI:  Bonnie Weeks is a 76 y.o. female patient of  ? mine.  The patient had been seen, evaluated, and treated for months conservatively in the  ? office with medication, activity modification, and injections.  The patient had  ? radiographic changes of bone-on-bone arthritis with endplate sclerosis and osteophytes noted.  Based on the radiographic changes and failed conservative measures, the patient  ? decided to proceed with definitive treatment, total knee replacement.  Risks of infection, DVT, component failure, need for revision surgery, neurovascular injury were reviewed in the office setting.  The postop course was reviewed stressing the efforts to maximize post-operative satisfaction and function.  Consent was obtained for benefit of pain  ? relief.  ? ?Laboratory Data: ?Admission on 02/21/2022, Discharged on 02/23/2022  ?Component Date Value Ref Range Status  ? Prothrombin Time 02/21/2022 12.0  11.4 - 15.2 seconds Final  ? INR 02/21/2022 0.9   0.8 - 1.2 Final  ? Comment: (NOTE) ?INR goal varies based on device and disease states. ?Performed at [facility], [address] ?[place], [place] [address] ?  ? aPTT 02/21/2022 26  24 - 36 seconds Final  ? Performed at [facility], [place] [address] [place], [place] [address]  ? WBC 02/22/2022 9.2  4.0 - 10.5 K/uL Final  ? RBC 02/22/2022 3.11 (L)  3.87 - 5.11 MIL/uL Final  ? Hemoglobin 02/22/2022 8.6 (L)  12.0 - 15.0 g/dL Final  ? HCT 02/22/2022 26.8 (L)  36.0 - 46.0 % Final  ? MCV 02/22/2022 86.2  80.0 - 100.0 fL Final  ? MCH 02/22/2022 27.7  26.0 - 34.0 pg Final  ? MCHC 02/22/2022 32.1  30.0 - 36.0 g/dL Final  ? RDW 02/22/2022 14.6  11.5 - 15.5 % Final  ? Platelets 02/22/2022 215  150 - 400 K/uL Final  ? nRBC 02/22/2022 0.0  0.0 - 0.2 % Final  ? Performed at [facility], [place] [address] [place], [place] [address]  ? Sodium 02/22/2022 136  135 - 145 mmol/L Final  ? Potassium 02/22/2022 3.2 (L)  3.5 - 5.1 mmol/L Final  ? Chloride 02/22/2022 101  98 - 111 mmol/L Final  ? CO2 02/22/2022 26  22 - 32 mmol/L Final  ? Glucose, Bld 02/22/2022 133 (H)  70 - 99 mg/dL Final  ? Glucose reference  range applies only to samples taken after fasting for at least 8 hours.  ? BUN 02/22/2022 14  8 - 23 mg/dL Final  ? Creatinine, Ser 02/22/2022 0.62  0.44 - 1.00 mg/dL Final  ? Calcium 02/22/2022 8.7 (L)  8.9 - 10.3 mg/dL Final  ? GFR, Estimated 02/22/2022 >60  >60 mL/min Final  ? Comment: (NOTE) ?Calculated using the CKD-EPI Creatinine Equation (2021) ?  ? Anion gap 02/22/2022 9  5 - 15 Final  ? Performed at [facility], [place] [address], [place], [place] [address]  ? WBC 02/23/2022 9.5  4.0 - 10.5 K/uL Final  ? RBC 02/23/2022 3.26 (L)  3.87 - 5.11 MIL/uL Final  ? Hemoglobin 02/23/2022 9.1 (L)  12.0 - 15.0 g/dL Final  ? HCT 02/23/2022 27.4 (L)  36.0 - 46.0 % Final  ? MCV 02/23/2022 84.0  80.0 - 100.0 fL Final  ? MCH 02/23/2022 27.9  26.0 - 34.0 pg Final  ?  MCHC 02/23/2022 33.2  30.0 - 36.0 g/dL Final  ? RDW 02/23/2022 14.7  11.5 - 15.5 % Final  ? Platelets 02/23/2022 229  150 - 400 K/uL Final  ? nRBC 02/23/2022 0.0  0.0 - 0.2 % Final  ? Performed at [facility], [place] [address], [place], [place] [address]  ? Sodium 02/23/2022 139  135 - 145 mmol/L Final  ? Potassium 02/23/2022 3.8  3.5 - 5.1 mmol/L Final  ? Chloride 02/23/2022 103  98 - 111 mmol/L Final  ? CO2 02/23/2022 29  22 - 32 mmol/L Final  ? Glucose, Bld 02/23/2022 114 (H)  70 - 99 mg/dL Final  ? Glucose reference range applies only to samples taken after fasting for at least 8 hours.  ? BUN 02/23/2022 14  8 - 23 mg/dL Final  ? Creatinine, Ser 02/23/2022 0.60  0.44 - 1.00 mg/dL Final  ? Calcium 02/23/2022 9.4  8.9 - 10.3 mg/dL Final  ? GFR, Estimated 02/23/2022 >60  >60 mL/min Final  ? Comment: (NOTE) ?Calculated using the CKD-EPI Creatinine Equation (2021) ?  ? Anion gap 02/23/2022 7  5 - 15 Final  ? Performed at [facility], [place] [address], [place], [place] [address]  ?Hospital Outpatient Visit on 02/15/2022  ?Component Date Value Ref Range Status  ? MRSA, PCR 02/15/2022 POSITIVE (A)  NEGATIVE Final  ? Comment: RESULT CALLED TO, READ BACK BY AND VERIFIED WITH: ?AFTERHOURS ?  ? Staphylococcus aureus 02/15/2022 POSITIVE (A)  NEGATIVE Final  ? Comment: (NOTE) ?The Xpert SA Assay (FDA approved for NASAL specimens in patients 57 ?years of age and older), is one component of a comprehensive ?surveillance program. It is not intended to diagnose infection nor to ?guide or monitor treatment. ?Performed at [facility], [address] ?[place], [place] [address] ?  ? WBC 02/15/2022 5.5  4.0 - 10.5 K/uL Final  ? RBC 02/15/2022 3.87  3.87 - 5.11 MIL/uL Final  ? Hemoglobin 02/15/2022 10.7 (L)  12.0 - 15.0 g/dL Final  ? HCT 02/15/2022 33.6 (L)  36.0 - 46.0 % Final  ? MCV 02/15/2022 86.8  80.0 - 100.0 fL Final  ? MCH 02/15/2022 27.6  26.0 - 34.0 pg Final  ? MCHC  02/15/2022 31.8  30.0 - 36.0 g/dL Final  ? RDW 02/15/2022 14.8  11.5 - 15.5 % Final  ? Platelets 02/15/2022 326  150 - 400 K/uL Final  ? nRBC 02/15/2022 0.0  0.0 - 0.2 % Final  ? Performed  at [facility], [place] [address], [place], [place] [address]  ? Sodium 02/15/2022 140  135 - 145 mmol/L Final  ? Potassium 02/15/2022 3.6  3.5 - 5.1 mmol/L Final  ? Chloride 02/15/2022 103  98 - 111 mmol/L Final  ? CO2 02/15/2022 28  22 - 32 mmol/L Final  ? Glucose, Bld 02/15/2022 91  70 - 99 mg/dL Final  ? Glucose reference range applies only to samples taken after fasting for at least 8 hours.  ? BUN 02/15/2022 17  8 - 23 mg/dL Final  ? Creatinine, Ser 02/15/2022 0.64  0.44 - 1.00 mg/dL Final  ? Calcium 02/15/2022 9.4  8.9 - 10.3 mg/dL Final  ? Total Protein 02/15/2022 7.1  6.5 - 8.1 g/dL Final  ? Albumin 02/15/2022 4.3  3.5 - 5.0 g/dL Final  ? AST 02/15/2022 17  15 - 41 U/L Final  ? ALT 02/15/2022 12  0 - 44 U/L Final  ? Alkaline Phosphatase 02/15/2022 39  38 - 126 U/L Final  ? Total Bilirubin 02/15/2022 0.3  0.3 - 1.2 mg/dL Final  ? GFR, Estimated 02/15/2022 >60  >60 mL/min Final  ? Comment: (NOTE) ?Calculated using the CKD-EPI Creatinine Equation (2021) ?  ? Anion gap 02/15/2022 9  5 - 15 Final  ? Performed at [facility], [place] [address], [place], [place] [address]  ? ABO/RH(D) 02/15/2022 A POS   Final  ? Antibody Screen 02/15/2022 NEG   Final  ? Sample Expiration 02/15/2022 02/24/2022,2359   Final  ? Extend sample reason 02/15/2022    Final  ?                 Value:NO TRANSFUSIONS OR PREGNANCY IN THE PAST 3 MONTHS ?Performed at [facility], [place] [address] [place], [place] [address] ?  ?Admission on 01/03/2022, Discharged on 01/05/2022  ?Component Date Value Ref Range Status  ? WBC 01/04/2022 10.2  4.0 - 10.5 K/uL Final  ? RBC 01/04/2022 3.58 (L)  3.87 - 5.11 MIL/uL Final  ? Hemoglobin 01/04/2022 10.1 (L)  12.0 - 15.0 g/dL Final  ? HCT 01/04/2022 31.1 (L)  36.0 - 46.0  % Final  ? MCV 01/04/2022 86.9  80.0 - 100.0 fL Final  ? MCH 01/04/2022 28.2  26.0 - 34.0 pg Final  ? MCHC 01/04/2022 32.5  30.0 - 36.0 g/dL Final  ? RDW 01/04/2022 15.3  11.5 - 15.5 % Final  ? Platele

## 2022-03-03 DIAGNOSIS — Z96651 Presence of right artificial knee joint: Secondary | ICD-10-CM | POA: Diagnosis not present

## 2022-03-03 DIAGNOSIS — M25561 Pain in right knee: Secondary | ICD-10-CM | POA: Diagnosis not present

## 2022-03-07 DIAGNOSIS — M25561 Pain in right knee: Secondary | ICD-10-CM | POA: Diagnosis not present

## 2022-03-07 DIAGNOSIS — Z96651 Presence of right artificial knee joint: Secondary | ICD-10-CM | POA: Diagnosis not present

## 2022-03-09 DIAGNOSIS — M25561 Pain in right knee: Secondary | ICD-10-CM | POA: Diagnosis not present

## 2022-03-09 DIAGNOSIS — Z96651 Presence of right artificial knee joint: Secondary | ICD-10-CM | POA: Diagnosis not present

## 2022-03-14 DIAGNOSIS — Z96651 Presence of right artificial knee joint: Secondary | ICD-10-CM | POA: Diagnosis not present

## 2022-03-14 DIAGNOSIS — M25561 Pain in right knee: Secondary | ICD-10-CM | POA: Diagnosis not present

## 2022-03-17 DIAGNOSIS — M25561 Pain in right knee: Secondary | ICD-10-CM | POA: Diagnosis not present

## 2022-03-17 DIAGNOSIS — Z96651 Presence of right artificial knee joint: Secondary | ICD-10-CM | POA: Diagnosis not present

## 2022-03-21 DIAGNOSIS — M25561 Pain in right knee: Secondary | ICD-10-CM | POA: Diagnosis not present

## 2022-03-21 DIAGNOSIS — Z96651 Presence of right artificial knee joint: Secondary | ICD-10-CM | POA: Diagnosis not present

## 2022-03-27 DIAGNOSIS — Z96651 Presence of right artificial knee joint: Secondary | ICD-10-CM | POA: Diagnosis not present

## 2022-03-27 DIAGNOSIS — M25561 Pain in right knee: Secondary | ICD-10-CM | POA: Diagnosis not present

## 2022-03-30 DIAGNOSIS — Z96651 Presence of right artificial knee joint: Secondary | ICD-10-CM | POA: Diagnosis not present

## 2022-03-30 DIAGNOSIS — M25561 Pain in right knee: Secondary | ICD-10-CM | POA: Diagnosis not present

## 2022-04-01 DIAGNOSIS — M5416 Radiculopathy, lumbar region: Secondary | ICD-10-CM | POA: Diagnosis not present

## 2022-04-11 DIAGNOSIS — Z96651 Presence of right artificial knee joint: Secondary | ICD-10-CM | POA: Diagnosis not present

## 2022-04-11 DIAGNOSIS — M25561 Pain in right knee: Secondary | ICD-10-CM | POA: Diagnosis not present

## 2022-04-12 DIAGNOSIS — Z96652 Presence of left artificial knee joint: Secondary | ICD-10-CM | POA: Diagnosis not present

## 2022-04-12 DIAGNOSIS — Z471 Aftercare following joint replacement surgery: Secondary | ICD-10-CM | POA: Diagnosis not present

## 2022-04-12 DIAGNOSIS — Z96651 Presence of right artificial knee joint: Secondary | ICD-10-CM | POA: Diagnosis not present

## 2022-04-12 DIAGNOSIS — Z96653 Presence of artificial knee joint, bilateral: Secondary | ICD-10-CM | POA: Diagnosis not present

## 2022-04-13 DIAGNOSIS — Z96651 Presence of right artificial knee joint: Secondary | ICD-10-CM | POA: Diagnosis not present

## 2022-04-13 DIAGNOSIS — M25561 Pain in right knee: Secondary | ICD-10-CM | POA: Diagnosis not present

## 2022-05-02 DIAGNOSIS — M5416 Radiculopathy, lumbar region: Secondary | ICD-10-CM | POA: Diagnosis not present

## 2022-05-04 DIAGNOSIS — M5416 Radiculopathy, lumbar region: Secondary | ICD-10-CM | POA: Diagnosis not present

## 2022-05-09 DIAGNOSIS — M5416 Radiculopathy, lumbar region: Secondary | ICD-10-CM | POA: Diagnosis not present

## 2022-05-16 DIAGNOSIS — M5416 Radiculopathy, lumbar region: Secondary | ICD-10-CM | POA: Diagnosis not present

## 2022-05-18 DIAGNOSIS — M5416 Radiculopathy, lumbar region: Secondary | ICD-10-CM | POA: Diagnosis not present

## 2022-05-25 DIAGNOSIS — M5416 Radiculopathy, lumbar region: Secondary | ICD-10-CM | POA: Diagnosis not present

## 2022-06-12 DIAGNOSIS — Z6832 Body mass index (BMI) 32.0-32.9, adult: Secondary | ICD-10-CM | POA: Diagnosis not present

## 2022-06-12 DIAGNOSIS — M5416 Radiculopathy, lumbar region: Secondary | ICD-10-CM | POA: Diagnosis not present

## 2022-06-26 DIAGNOSIS — R5383 Other fatigue: Secondary | ICD-10-CM | POA: Diagnosis not present

## 2022-06-26 DIAGNOSIS — R55 Syncope and collapse: Secondary | ICD-10-CM | POA: Diagnosis not present

## 2022-06-26 DIAGNOSIS — Z86711 Personal history of pulmonary embolism: Secondary | ICD-10-CM | POA: Diagnosis not present

## 2022-06-26 DIAGNOSIS — I10 Essential (primary) hypertension: Secondary | ICD-10-CM | POA: Diagnosis not present

## 2022-06-26 DIAGNOSIS — Z6833 Body mass index (BMI) 33.0-33.9, adult: Secondary | ICD-10-CM | POA: Diagnosis not present

## 2022-06-29 DIAGNOSIS — R0600 Dyspnea, unspecified: Secondary | ICD-10-CM | POA: Diagnosis not present

## 2022-07-17 DIAGNOSIS — I2699 Other pulmonary embolism without acute cor pulmonale: Secondary | ICD-10-CM | POA: Diagnosis not present

## 2022-07-18 DIAGNOSIS — I2699 Other pulmonary embolism without acute cor pulmonale: Secondary | ICD-10-CM | POA: Diagnosis not present

## 2022-07-18 DIAGNOSIS — R059 Cough, unspecified: Secondary | ICD-10-CM | POA: Diagnosis not present

## 2022-07-18 DIAGNOSIS — U071 COVID-19: Secondary | ICD-10-CM | POA: Diagnosis not present

## 2022-07-18 DIAGNOSIS — Z6833 Body mass index (BMI) 33.0-33.9, adult: Secondary | ICD-10-CM | POA: Diagnosis not present

## 2022-07-18 DIAGNOSIS — I77819 Aortic ectasia, unspecified site: Secondary | ICD-10-CM | POA: Diagnosis not present

## 2022-07-26 DIAGNOSIS — Z86711 Personal history of pulmonary embolism: Secondary | ICD-10-CM | POA: Diagnosis not present

## 2022-07-31 DIAGNOSIS — M5416 Radiculopathy, lumbar region: Secondary | ICD-10-CM | POA: Diagnosis not present

## 2022-08-01 ENCOUNTER — Telehealth: Payer: Self-pay | Admitting: Internal Medicine

## 2022-08-01 NOTE — Telephone Encounter (Signed)
Add on Friday pm

## 2022-08-01 NOTE — Telephone Encounter (Signed)
She will call back tomorrow if she is able to come in for a follow up visit.

## 2022-08-01 NOTE — Telephone Encounter (Signed)
Please advise if ok to work in? Thanks

## 2022-08-02 NOTE — Telephone Encounter (Signed)
Canceled 10/10 appt and added patient to 08/04/2022 at 2:30pm in [place] with MW.

## 2022-08-02 NOTE — Telephone Encounter (Signed)
Bonnie Weeks, [place], mrn [phone] called back and is able to come at 2:30 on Friday.  Are you able to put her in for me? His schedule is not open for 08/04/22 afternoon in [place] and it will not let me.

## 2022-08-04 ENCOUNTER — Ambulatory Visit (HOSPITAL_COMMUNITY)
Admission: RE | Admit: 2022-08-04 | Discharge: 2022-08-04 | Disposition: A | Discharge status: Home / Self Care | Payer: Medicare PPO | Source: Ambulatory Visit | Attending: Internal Medicine | Admitting: Internal Medicine

## 2022-08-04 ENCOUNTER — Ambulatory Visit (INDEPENDENT_AMBULATORY_CARE_PROVIDER_SITE_OTHER): Payer: Medicare PPO | Admitting: Internal Medicine

## 2022-08-04 ENCOUNTER — Encounter: Payer: Self-pay | Admitting: Internal Medicine

## 2022-08-04 DIAGNOSIS — R059 Cough, unspecified: Secondary | ICD-10-CM | POA: Diagnosis not present

## 2022-08-04 DIAGNOSIS — R058 Other specified cough: Secondary | ICD-10-CM | POA: Insufficient documentation

## 2022-08-04 DIAGNOSIS — R0609 Other forms of dyspnea: Secondary | ICD-10-CM

## 2022-08-04 MED ORDER — PREDNISONE 10 MG PO TABS: ORAL_TABLET | ORAL | 0 refills | Status: DC

## 2022-08-04 MED ORDER — OMEPRAZOLE 40 MG PO CPDR: DELAYED_RELEASE_CAPSULE | ORAL | 2 refills | Status: AC

## 2022-08-04 NOTE — Patient Instructions (Addendum)
Omeprazole 40 mg Take 30- 60 min before your first and last meals of the day   GERD (REFLUX)  is an extremely common cause of respiratory symptoms just like yours , many times with no obvious heartburn at all.    It can be treated with medication, but also with lifestyle changes including elevation of the head of your bed  avoidance of late meals, excessive alcohol, and avoid fatty foods, chocolate, peppermint, colas, red wine, and acidic juices such as orange juice.  NO MINT OR MENTHOL PRODUCTS SO NO COUGH DROPS  USE SUGARLESS CANDY INSTEAD (Jolley ranchers or Stover's or Life Savers) or even ice chips will also do - the key is to swallow to prevent all throat clearing. NO OIL BASED VITAMINS - use powdered substitutes.  Avoid fish oil when coughing.   Prednisone 10 mg take  4 each am x 2 days,   2 each am x 2 days,  1 each am x 2 days and stop   Take delsym (otc)  two tsp every 12 hours and supplement if needed with  hydrocodone  10 mg every 4 hours to suppress the urge to cough. Swallowing water and/or using ice chips/non mint and menthol containing candies (such as lifesavers or sugarless jolly ranchers) are also effective.  You should rest your voice and avoid activities that you know make you cough.  Once you have eliminated the cough for 3 straight days try reducing the hydrocodone   then the delsym as tolerated.    For short of breath/ wheeze just use the xopenex neb up to 4 x daily if needed - don't' use inhalers as they may trigger cough  Please remember to go to the lab department   for your tests - we will call you with the results when they are available.      Please remember to go to the  x-ray department  @  [facility] for your tests - we will call you with the results when they are available     See your sinus doctor before you return if possible    Please schedule a follow up office visit in 4 weeks, sooner if needed

## 2022-08-04 NOTE — Progress Notes (Unsigned)
NEALY HICKMON, female    DOB: 1946-04-11    MRN: [id number]   Brief patient profile:  76  yowf  never smoker but passive smoker with recurrent tonsilitis > removed age 76 and f/u by ENT in [place] referred to pulmonary clinic in [place]  08/04/2022 by Judd Lien MD  for recurrent cough dates to late 20's assoc with freq sinus infections with 3 sinus surgeries by age 16  and also 3 HH surgeries never allergy tested. H/o PE on coumadin as can't afford DOAC.  Prev eval by Dr Felton Clinton with lots of albuterol that didn't help much / couldn't tell singulair helped but prednisone much better temporary only     History of Present Illness  08/04/2022  Pulmonary/ 1st office eval/ Navia Lindahl / [facility]  Chief Complaint  Patient presents with   Consult    She is having some has some clear sputum with the cough   Dyspnea:  pushed buggy around [employer] ok / problem carrying groceries/ slowed by knees /back and sob "about the same point" with activity  Cough: worse when head hits pillow and helps to sip water / white mucus /  assoc heaving and urinary incont / nasal congestion  Sleep: new pattern wakes up every hour since late July 2023 p covid /30 degrees hob  SABA use: not really helpful  Using mints  cough drops and overt HB despite prior NF  No obvious day to day or daytime pattern/variability or assoc excess/ purulent sputum or mucus plugs or hemoptysis or cp or chest tightness, subjective wheeze    . Also denies any obvious fluctuation of symptoms with weather or environmental changes or other aggravating or alleviating factors except as outlined above   No unusual exposure hx or h/o childhood pna/ asthma or knowledge of premature birth.  Current Allergies, Complete Past Medical History, Past Surgical History, Family History, and Social History were reviewed in [employer] record.  ROS  The following are not active complaints unless bolded Hoarseness, sore throat,  dysphagia, dental problems, itching, sneezing,  nasal congestion or discharge of excess mucus or purulent secretions, ear ache,   fever, chills, sweats, unintended wt loss or wt gain, classically pleuritic or exertional cp,  orthopnea pnd or arm/hand swelling  or leg swelling, presyncope, palpitations, abdominal pain, anorexia, nausea, vomiting, diarrhea  or change in bowel habits or change in bladder habits, change in stools or change in urine, dysuria, hematuria,  rash, arthralgias/back pain, visual complaints, headache, numbness, weakness or ataxia or problems with walking or coordination,  change in mood or  memory.           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     Calcium Carb-Cholecalciferol (CALCIUM 600/VITAMIN D PO) Take 1 tablet by mouth daily.  celecoxib (CELEBREX) 200 MG capsule Take 200 mg by mouth daily.     cetirizine (ZYRTEC) 10 MG tablet Take 10 mg by mouth daily.     denosumab (PROLIA) 60 MG/ML SOSY injection Inject 60 mg into the skin every 6 (six) months.     diltiazem (CARDIZEM) 30 MG tablet Take 1 tablet (30 mg total) 3 (three) times daily before meals by mouth. (Patient taking differently: Take 30 mg by mouth daily.) 90 tablet 0   docusate sodium (COLACE) 100 MG capsule Take 1 capsule (100 mg total) by mouth 2 (two) times daily. 10 capsule 0   FLUoxetine (PROZAC) 40 MG capsule Take 40 mg by mouth daily.     hydrochlorothiazide (HYDRODIURIL) 25 MG tablet Take 25 mg by  mouth daily.     HYDROcodone-acetaminophen (NORCO) 7.5-325 MG tablet Take 1-2 tablets by mouth every 4 (four) hours as needed for severe pain (pain score 7-10). 42 tablet 0   losartan-hydrochlorothiazide (HYZAAR) 50-12.5 MG tablet Take 1 tablet by mouth daily.     montelukast (SINGULAIR) 10 MG tablet Take 10 mg by mouth daily.      Multiple Vitamins-Minerals (MULTIVITAMIN WITH MINERALS) tablet Take 1 tablet by mouth daily.     omeprazole (PRILOSEC) 40 MG capsule Take 40 mg by mouth daily.            polyethylene glycol (MIRALAX / GLYCOLAX) 17 g packet Take 17 g by mouth daily as needed for mild constipation. 14 each 0   SUMAtriptan 6 MG/0.5ML SOAJ Inject 6 mg into the skin daily as needed (For migraine).     Vibegron (GEMTESA) 75 MG TABS Take 75 mg by mouth daily.     warfarin (COUMADIN) 4 MG tablet Take 4 mg by mouth daily.     methocarbamol (ROBAXIN) 500 MG tablet Take 1 tablet (500 mg total) by mouth every 6 (six) hours as needed for muscle spasms. (Patient not taking: Reported on 08/04/2022) 40 tablet 0   rivaroxaban (XARELTO) 20 MG TABS tablet Take 20 mg by mouth daily. (Patient not taking: Reported on 08/04/2022)     No facility-administered medications prior to visit.     Objective:     BP 112/80 (BP Location: Left Arm, Cuff Size: Normal)   Pulse 79   Temp 98.4 F (36.9 C)   Ht '5\' 3"'$  (1.6 m)   Wt 187 lb (84.8 kg)   SpO2 96% Comment: ra  BMI 33.13 kg/m   SpO2: 96 % (ra)  Amb hoarse wf with nasal tone to voice    HEENT : Oropharynx  clear     Nasal turbinates nl    NECK :  without  apparent JVD/ palpable Nodes/TM    LUNGS: no acc muscle use,  Nl contour chest which is clear to A and P bilaterally without cough on insp or exp maneuvers   CV:  RRR  no s3 or murmur or increase in P2, and no edema   ABD:  soft and nontender with nl inspiratory excursion in the supine position. No bruits or organomegaly appreciated   MS:  Nl gait/ ext warm without deformities Or obvious  joint restrictions  calf tenderness, cyanosis or clubbing    SKIN: warm and dry without lesions    NEURO:  alert, approp, nl sensorium with  no motor or cerebellar deficits apparent.      I personally reviewed images and agree with radiology impression as follows:   Chest CTa 06/29/22 Mild SS atx bases o/w wnl  Assessment   Upper airway cough syndrome Onset in her 23s assoc with sinus dz and overt GERD s/p NF and worse since covid July 2023  -  08/04/2022 rec 1st gen H1 blockers per guidelines , max gerd rx and leave off inhalers/ replace with neb xopenex prn plus pred x 6 d and f/u in 2 weeks  - Allergy screen 08/04/2022 >  Eos 0. /  IgE    Upper airway cough syndrome (previously labeled PNDS),  is so named because it's frequently impossible to sort out how much is  CR/sinusitis with freq throat clearing (which can be related to primary GERD)   vs  causing  secondary (" extra esophageal")  GERD from wide swings in gastric pressure that occur with throat clearing, often  promoting self use of mint and menthol lozenges that reduce the lower esophageal sphincter tone and exacerbate the problem further in a cyclical fashion.   These are the same pts (now being labeled as having "irritable larynx syndrome" by some cough centers) who not infrequently have a history of having failed to tolerate ace inhibitors,  dry powder inhalers or biphosphonates or report having atypical/extraesophageal reflux symptoms that don't respond to standard doses of PPI  and are easily confused as having aecopd or asthma flares by even experienced allergists/ pulmonologists (myself included).  Of the three most common causes of  Sub-acute / recurrent or chronic cough, only one (GERD)  can actually contribute to/ trigger  the other two (asthma and post nasal drip syndrome)  and perpetuate the cylce of cough.  While not intuitively obvious, many patients with chronic low grade reflux do not cough until there is a  primary insult that disturbs the protective epithelial barrier and exposes sensitive nerve endings.     This is typically trigger by  Virus (as was the case here)  but can due to PNDS and  either may apply here.   The point is that once this occurs, it is difficult to eliminate the cycle  using anything but a maximally effective acid suppression regimen at least in the short run, accompanied by an appropriate diet to address non acid GERD and eliminate pnds with 1st gen H1 blockers per guidelines  >>> also so added 6 day taper off  Prednisone starting at 40 mg per day in case of component of Th-2 driven upper or lower airways inflammation (if cough responds short term only to relapse before return while will on full rx for uacs (as above), then  that would point to allergic rhinitis/ asthma or eos bronchitis as alternative dx)   Also needs ENT eval for ? Underlying sinus dz given the tone of her voice and the immediate cough at hs she experiences nightly  Will regroup in 2 weeks    .   DOE (dyspnea on exertion) Onset p covid July 2023  - CT chest 06/29/22 unremarkable x for min SSAtx - 08/04/2022   Walked on RA  x  2  lap(s) =  approx 300  ft  @ mod  pace, stopped due to back pain with lowest 02 sats 99% and no sob    Suspect this is a conditioning issue, no further w/u needed at this point   Each maintenance medication was reviewed in detail including emphasizing most importantly the difference between maintenance and prns and under what circumstances the prns are to be triggered using an action plan format where appropriate.  Total time for H and P, chart review,  counseling, reviewing hfa/neb device(s) , directly observing portions of ambulatory 02 saturation study/ and generating customized AVS unique to this office visit / same day charting  > 45 min pt new to me with acute on chronic refractory resp symptoms                  Christinia Gully, MD 08/04/2022

## 2022-08-05 ENCOUNTER — Encounter: Payer: Self-pay | Admitting: Internal Medicine

## 2022-08-05 DIAGNOSIS — R0609 Other forms of dyspnea: Secondary | ICD-10-CM | POA: Insufficient documentation

## 2022-08-05 NOTE — Assessment & Plan Note (Signed)
Onset in her 68s assoc with sinus dz and overt GERD s/p NF and worse since covid July 2023  -  08/04/2022 rec 1st gen H1 blockers per guidelines , max gerd rx and leave off inhalers/ replace with neb xopenex prn plus pred x 6 d and f/u in 2 weeks  - Allergy screen 08/04/2022 >  Eos 0. /  IgE    Upper airway cough syndrome (previously labeled PNDS),  is so named because it's frequently impossible to sort out how much is  CR/sinusitis with freq throat clearing (which can be related to primary GERD)   vs  causing  secondary (" extra esophageal")  GERD from wide swings in gastric pressure that occur with throat clearing, often  promoting self use of mint and menthol lozenges that reduce the lower esophageal sphincter tone and exacerbate the problem further in a cyclical fashion.   These are the same pts (now being labeled as having "irritable larynx syndrome" by some cough centers) who not infrequently have a history of having failed to tolerate ace inhibitors,  dry powder inhalers or biphosphonates or report having atypical/extraesophageal reflux symptoms that don't respond to standard doses of PPI  and are easily confused as having aecopd or asthma flares by even experienced allergists/ pulmonologists (myself included).  Of the three most common causes of  Sub-acute / recurrent or chronic cough, only one (GERD)  can actually contribute to/ trigger  the other two (asthma and post nasal drip syndrome)  and perpetuate the cylce of cough.  While not intuitively obvious, many patients with chronic low grade reflux do not cough until there is a primary insult that disturbs the protective epithelial barrier and exposes sensitive nerve endings.     This is typically trigger by  Virus (as was the case here)  but can due to PNDS and  either may apply here.   The point is that once this occurs, it is difficult to eliminate the cycle  using anything but a maximally effective acid suppression regimen at least in the  short run, accompanied by an appropriate diet to address non acid GERD and eliminate pnds with 1st gen H1 blockers per guidelines  >>> also so added 6 day taper off  Prednisone starting at 40 mg per day in case of component of Th-2 driven upper or lower airways inflammation (if cough responds short term only to relapse before return while will on full rx for uacs (as above), then  that would point to allergic rhinitis/ asthma or eos bronchitis as alternative dx)   Also needs ENT eval for ? Underlying sinus dz given the tone of her voice and the immediate cough at hs she experiences nightly  Will regroup in 2 weeks    .

## 2022-08-05 NOTE — Assessment & Plan Note (Signed)
Onset p covid July 2023  - CT chest 06/29/22 unremarkable x for min SSAtx - 08/04/2022   Walked on RA  x  2  lap(s) =  approx 300  ft  @ mod  pace, stopped due to back pain with lowest 02 sats 99% and no sob    Suspect this is a conditioning issue, no further w/u needed at this point   Each maintenance medication was reviewed in detail including emphasizing most importantly the difference between maintenance and prns and under what circumstances the prns are to be triggered using an action plan format where appropriate.  Total time for H and P, chart review, counseling, reviewing hfa/neb device(s) , directly observing portions of ambulatory 02 saturation study/ and generating customized AVS unique to this office visit / same day charting  > 45 min pt new to me with acute on chronic refractory resp symptoms

## 2022-08-07 ENCOUNTER — Telehealth: Payer: Self-pay | Admitting: Internal Medicine

## 2022-08-07 LAB — CBC WITH DIFFERENTIAL/PLATELET
Basophils Absolute: 0 10*3/uL (ref 0.0–0.2)
Basos: 1 %
EOS (ABSOLUTE): 0.2 10*3/uL (ref 0.0–0.4)
Eos: 6 %
Hematocrit: 32.3 % — ABNORMAL LOW (ref 34.0–46.6)
Hemoglobin: 10.5 g/dL — ABNORMAL LOW (ref 11.1–15.9)
Immature Grans (Abs): 0 10*3/uL (ref 0.0–0.1)
Immature Granulocytes: 0 %
Lymphocytes Absolute: 1.3 10*3/uL (ref 0.7–3.1)
Lymphs: 32 %
MCH: 25.4 pg — ABNORMAL LOW (ref 26.6–33.0)
MCHC: 32.5 g/dL (ref 31.5–35.7)
MCV: 78 fL — ABNORMAL LOW (ref 79–97)
Monocytes Absolute: 0.5 10*3/uL (ref 0.1–0.9)
Monocytes: 12 %
Neutrophils Absolute: 2 10*3/uL (ref 1.4–7.0)
Neutrophils: 49 %
Platelets: 301 10*3/uL (ref 150–450)
RBC: 4.14 x10E6/uL (ref 3.77–5.28)
RDW: 15.9 % — ABNORMAL HIGH (ref 11.7–15.4)
WBC: 4.1 10*3/uL (ref 3.4–10.8)

## 2022-08-07 LAB — SEDIMENTATION RATE: Sed Rate: 22 mm/hr (ref 0–40)

## 2022-08-07 LAB — IGE: IgE (Immunoglobulin E), Serum: 157 IU/mL (ref 6–495)

## 2022-08-07 NOTE — Telephone Encounter (Signed)
Aware, see result note 

## 2022-08-07 NOTE — Telephone Encounter (Signed)
Call report from [place] on Chest Xray   CLINICAL DATA:  COVID July 2023, persistent cough   EXAM: CHEST - 2 VIEW   COMPARISON:  07/18/2022   FINDINGS: Frontal and lateral views of the chest demonstrate an unremarkable cardiac silhouette. No acute airspace disease, effusion, or pneumothorax. The lungs are hyperinflated. Stable opacity obscuring the posterior costophrenic angle on lateral view. No acute bony abnormalities. Postsurgical changes at the thoracolumbar junction.   IMPRESSION: 1. Stable nodular opacity obscuring the posterior costophrenic angle on lateral view. As previously recommended, CT chest may be useful for further evaluation. 2. Otherwise no acute intrathoracic process.   These results will be called to the ordering clinician or representative by the Radiologist Assistant, and communication documented in the PACS or [employer].

## 2022-08-09 DIAGNOSIS — Z86711 Personal history of pulmonary embolism: Secondary | ICD-10-CM | POA: Diagnosis not present

## 2022-08-09 DIAGNOSIS — I2699 Other pulmonary embolism without acute cor pulmonale: Secondary | ICD-10-CM | POA: Diagnosis not present

## 2022-08-09 NOTE — Progress Notes (Signed)
Called pt and there was no answer-LMTCB °

## 2022-08-09 NOTE — Progress Notes (Signed)
Called the pt and there was no answer- LMTCB    

## 2022-08-10 ENCOUNTER — Telehealth: Payer: Self-pay | Admitting: Internal Medicine

## 2022-08-10 NOTE — Telephone Encounter (Signed)
Spoke with patient  regarding cxr results. They verbalized understanding. No further questions.  Nothing further needed at this time.

## 2022-08-11 DIAGNOSIS — I4891 Unspecified atrial fibrillation: Secondary | ICD-10-CM | POA: Diagnosis not present

## 2022-08-16 DIAGNOSIS — I4891 Unspecified atrial fibrillation: Secondary | ICD-10-CM | POA: Diagnosis not present

## 2022-08-16 DIAGNOSIS — Z86711 Personal history of pulmonary embolism: Secondary | ICD-10-CM | POA: Diagnosis not present

## 2022-08-29 ENCOUNTER — Institutional Professional Consult (permissible substitution): Payer: Medicare PPO | Admitting: Internal Medicine

## 2022-08-30 ENCOUNTER — Ambulatory Visit (INDEPENDENT_AMBULATORY_CARE_PROVIDER_SITE_OTHER): Payer: Medicare PPO | Admitting: Internal Medicine

## 2022-08-30 ENCOUNTER — Encounter: Payer: Self-pay | Admitting: Internal Medicine

## 2022-08-30 DIAGNOSIS — R0609 Other forms of dyspnea: Secondary | ICD-10-CM

## 2022-08-30 DIAGNOSIS — I4891 Unspecified atrial fibrillation: Secondary | ICD-10-CM | POA: Diagnosis not present

## 2022-08-30 DIAGNOSIS — R058 Other specified cough: Secondary | ICD-10-CM

## 2022-08-30 MED ORDER — PREDNISONE 10 MG PO TABS: ORAL_TABLET | ORAL | 0 refills | Status: AC

## 2022-08-30 NOTE — Patient Instructions (Addendum)
Continue for drainage / throat tickle try take CHLORPHENIRAMINE  4 mg  ("Allergy Relief" '4mg'$   at [facility] should be easiest to find in the blue box usually on bottom shelf)  take one every 4 hours as needed - extremely effective and inexpensive over the counter- may cause drowsiness so start with just a dose or two an hour before bedtime and see how you tolerate it before trying in daytime in place zyrtec    If not better:  Prednisone 10 mg Take 4 for two days three for two days two for two days one for two days    Please schedule a follow up office visit in 6 weeks, call sooner if needed

## 2022-08-30 NOTE — Assessment & Plan Note (Addendum)
Onset in her 20s assoc with sinus dz and overt GERD s/p NF and worse since covid July 2023  -  08/04/2022 rec 1st gen H1 blockers per guidelines , max gerd rx and leave off inhalers/ replace with neb xopenex prn plus pred x 6 d and f/u in 2 weeks  - Allergy screen 08/04/2022 >  Eos 0.2 /  IgE  157   >>>   08/30/2022 reported trnasient improvement on pred x 6 d and tol at present but if worse rec  rechallenge with 8 d and consider ICS / complete w/u with ent eval / continue max gerd rx and 1st gen H1 blockers per guidelines

## 2022-08-30 NOTE — Progress Notes (Signed)
Bonnie Weeks, female    DOB: 05/13/1946    MRN: [id number]   Brief patient profile:  76 yowf  yowf  never smoker but passive smoker with recurrent tonsilitis > removed age 76 and f/u by ENT in [place] referred to pulmonary clinic in [place]  08/04/2022 by Judd Lien MD  for recurrent cough dates to late 20's assoc with freq sinus infections with 3 sinus surgeries by age 70  and also 3 HH surgeries never allergy tested. H/o PE on coumadin as can't afford DOAC.  Prev eval by Dr Felton Clinton with lots of albuterol that didn't help much / couldn't tell singulair helped but prednisone much better temporary only     History of Present Illness  08/04/2022  Pulmonary/ 1st office eval/ Kace Hartje / [facility]  Chief Complaint  Patient presents with   Consult    She is having some has some clear sputum with the cough   Dyspnea:  pushed buggy around [employer] ok / problem carrying groceries/ slowed by knees /back and sob "about the same point" with activity  Cough: worse when head hits pillow and helps to sip water / white mucus /  assoc heaving and urinary incont / nasal congestion  Sleep: new pattern wakes up every hour since late July 2023 p covid /30 degrees hob  SABA use: not really helpful  Using mints  cough drops and overt HB despite prior NF Rec Omeprazole 40 mg Take 30- 60 min before your first and last meals of the day  GERD diet reviewed, bed blocks   Prednisone 10 mg take  4 each am x 2 days,   2 each am x 2 days,  1 each am x 2 days and stop  Take delsym (otc)  two tsp every 12 hours and supplement if needed with  hydrocodone 10 mg every 4 hours  Please remember to go to the lab department    Eos 0.2 / IgE  157 See your sinus doctor before you return if possible > not seen as of 08/30/2022     08/30/2022  f/u ov/[place] office/Celine Dishman re: late 2s  maint on gerd rx and h1 prn, did not need the delsym or hydrocodone and did not cough at all while on prednisone    Chief Complaint  Patient  presents with   Follow-up    Cough has improved since last ov   Dyspnea:  only with lots of vaccuming  Cough: daytime no obvious trigger other than laughter  Sleeping: bed is flat / no noct cough  SABA use: none  02: none  Covid status: infected x 3      No obvious day to day or daytime variability or assoc excess/ purulent sputum or mucus plugs or hemoptysis or cp or chest tightness, subjective wheeze or overt sinus or hb symptoms.   Sleeping  without nocturnal  or early am exacerbation  of respiratory  c/o's or need for noct saba. Also denies any obvious fluctuation of symptoms with weather or environmental changes or other aggravating or alleviating factors except as outlined above   No unusual exposure hx or h/o childhood pna/ asthma or knowledge of premature birth.  Current Allergies, Complete Past Medical History, Past Surgical History, Family History, and Social History were reviewed in [employer] record.  ROS  The following are not active complaints unless bolded Hoarseness, sore throat, dysphagia, dental problems, itching, sneezing,  nasal congestion or discharge of excess mucus or purulent secretions, ear  ache,   fever, chills, sweats, unintended wt loss or wt gain, classically pleuritic or exertional cp,  orthopnea pnd or arm/hand swelling  or leg swelling, presyncope, palpitations, abdominal pain, anorexia, nausea, vomiting, diarrhea  or change in bowel habits or change in bladder habits, change in stools or change in urine, dysuria, hematuria,  rash, arthralgias, visual complaints, headache, numbness, weakness or ataxia or problems with walking or coordination,  change in mood or  memory.        Current Meds  Medication Sig   atorvastatin (LIPITOR) 10 MG tablet Take 10 mg by mouth daily.   Calcium Carb-Cholecalciferol (CALCIUM 600/VITAMIN D PO) Take 1 tablet by mouth daily.   celecoxib (CELEBREX) 200 MG capsule Take 200 mg by mouth daily.   cetirizine  (ZYRTEC) 10 MG tablet Take 10 mg by mouth daily.   denosumab (PROLIA) 60 MG/ML SOSY injection Inject 60 mg into the skin every 6 (six) months.   diltiazem (CARDIZEM) 30 MG tablet Take 1 tablet (30 mg total) 3 (three) times daily before meals by mouth. (Patient taking differently: Take 30 mg by mouth daily.)   docusate sodium (COLACE) 100 MG capsule Take 1 capsule (100 mg total) by mouth 2 (two) times daily.   FLUoxetine (PROZAC) 40 MG capsule Take 40 mg by mouth daily.   hydrochlorothiazide (HYDRODIURIL) 25 MG tablet Take 25 mg by mouth daily.   HYDROcodone-acetaminophen (NORCO) 7.5-325 MG tablet Take 1-2 tablets by mouth every 4 (four) hours as needed for severe pain (pain score 7-10).   losartan-hydrochlorothiazide (HYZAAR) 50-12.5 MG tablet Take 1 tablet by mouth daily.   montelukast (SINGULAIR) 10 MG tablet Take 10 mg by mouth daily.    Multiple Vitamins-Minerals (MULTIVITAMIN WITH MINERALS) tablet Take 1 tablet by mouth daily.   omeprazole (PRILOSEC) 40 MG capsule Take 30- 60 min before your first and last meals of the day   polyethylene glycol (MIRALAX / GLYCOLAX) 17 g packet Take 17 g by mouth daily as needed for mild constipation.   predniSONE (DELTASONE) 10 MG tablet Take  4 each am x 2 days,   2 each am x 2 days,  1 each am x 2 days and stop   SUMAtriptan 6 MG/0.5ML SOAJ Inject 6 mg into the skin daily as needed (For migraine).   Vibegron (GEMTESA) 75 MG TABS Take 75 mg by mouth daily.   warfarin (COUMADIN) 4 MG tablet Take 4 mg by mouth daily. 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; on  lifelong anticoagulation      Objective:     Wt Readings from Last 3 Encounters:  08/30/22 187 lb 9.6 oz (85.1 kg)  08/04/22 187 lb (84.8 kg)  02/22/22 185 lb 3 oz (84 kg)      Vital signs reviewed  08/30/2022  - Note at rest  02 sats  95% on RA   General appearance:    amb wf cough with laugther or deep breath    HEENT : Oropharynx  clear      Nasal turbinates nl    NECK :  without  apparent JVD/ palpable Nodes/TM    LUNGS: no acc muscle use,  Nl contour chest which is clear to A and P bilaterally without cough on insp or exp maneuvers   CV:  RRR  no s3 or murmur or increase in P2, and no edema   ABD:  soft and nontender with nl inspiratory excursion in the supine position. No bruits or organomegaly appreciated   MS:  Nl gait/ ext warm without deformities Or obvious joint restrictions  calf tenderness, cyanosis or clubbing    SKIN: warm and dry without lesions    NEURO:  alert, approp, nl sensorium with  no motor or cerebellar deficits apparent.              Assessment       .

## 2022-08-30 NOTE — Assessment & Plan Note (Addendum)
Onset p covid July 2023  - CT chest 06/29/22 unremarkable x for min SSAtx - 08/04/2022   Walked on RA  x  2  lap(s) =  approx 300  ft  @ mod  pace, stopped due to back pain with lowest 02 sats 99% and no sob   - 08/30/2022   Walked on RA  x  2  lap(s) =  approx 300  ft  @ slow pace, stopped due to sob with lowest 02 sats 96%   rec continue sub max ex and track 02 sats over time at peak  F/u 6 weeks  Each maintenance medication was reviewed in detail including emphasizing most importantly the difference between maintenance and prns and under what circumstances the prns are to be triggered using an action plan format where appropriate.  Total time for H and P, chart review, counseling,  directly observing portions of ambulatory 02 saturation study/ and generating customized AVS unique to this office visit / same day charting > 30 min

## 2022-08-31 ENCOUNTER — Telehealth: Payer: Self-pay | Admitting: Internal Medicine

## 2022-08-31 DIAGNOSIS — R058 Other specified cough: Secondary | ICD-10-CM

## 2022-08-31 NOTE — Telephone Encounter (Signed)
Called and spoke with patient. She states last time she called to get an appt with her ENT dr. Deborha Payment told her she would need a new patient appt/ referral. She wants referral sent to her established ENT doctor.   Referral placed and patient notified. Nothing further needed

## 2022-09-07 DIAGNOSIS — S2001XA Contusion of right breast, initial encounter: Secondary | ICD-10-CM | POA: Diagnosis not present

## 2022-09-11 ENCOUNTER — Telehealth: Payer: Self-pay | Admitting: Internal Medicine

## 2022-09-11 NOTE — Telephone Encounter (Signed)
Called and gave pt number:  [phone] for [facility]. Patient states she has tried to call them but has not gotten an answer. Will try to call them again for patient tomorrow morning if able and will let patient know if I am able to get through. Patient states she will go by office in person if she can't reach them next time by phone.

## 2022-09-12 NOTE — Telephone Encounter (Signed)
Called [facility] and spoke with Miranda. She states they are short staffed and that is why patient has not been scheduled sooner. Miranda made patient an appt for 10/18/2022 at 2:00 pm with Dr. Elinor Dodge and states she will send patient a new patient packet.   Called and gave pt this info and she voiced understanding of appt. Advised her to call [facility] and ask for Miranda if there are any other concerns about her appt.

## 2022-09-13 DIAGNOSIS — M48062 Spinal stenosis, lumbar region with neurogenic claudication: Secondary | ICD-10-CM | POA: Diagnosis not present

## 2022-09-13 DIAGNOSIS — I2699 Other pulmonary embolism without acute cor pulmonale: Secondary | ICD-10-CM | POA: Diagnosis not present

## 2022-09-13 DIAGNOSIS — M5416 Radiculopathy, lumbar region: Secondary | ICD-10-CM | POA: Diagnosis not present

## 2022-09-13 DIAGNOSIS — I4891 Unspecified atrial fibrillation: Secondary | ICD-10-CM | POA: Diagnosis not present

## 2022-09-13 DIAGNOSIS — R030 Elevated blood-pressure reading, without diagnosis of hypertension: Secondary | ICD-10-CM | POA: Diagnosis not present

## 2022-09-14 DIAGNOSIS — J329 Chronic sinusitis, unspecified: Secondary | ICD-10-CM | POA: Diagnosis not present

## 2022-09-14 DIAGNOSIS — J324 Chronic pansinusitis: Secondary | ICD-10-CM | POA: Diagnosis not present

## 2022-09-14 DIAGNOSIS — R053 Chronic cough: Secondary | ICD-10-CM | POA: Diagnosis not present

## 2022-09-19 DIAGNOSIS — M48062 Spinal stenosis, lumbar region with neurogenic claudication: Secondary | ICD-10-CM | POA: Diagnosis not present

## 2022-09-21 DIAGNOSIS — I350 Nonrheumatic aortic (valve) stenosis: Secondary | ICD-10-CM | POA: Diagnosis not present

## 2022-09-21 DIAGNOSIS — I10 Essential (primary) hypertension: Secondary | ICD-10-CM | POA: Diagnosis not present

## 2022-09-21 DIAGNOSIS — E782 Mixed hyperlipidemia: Secondary | ICD-10-CM | POA: Diagnosis not present

## 2022-09-21 DIAGNOSIS — R600 Localized edema: Secondary | ICD-10-CM | POA: Diagnosis not present

## 2022-09-21 DIAGNOSIS — I2699 Other pulmonary embolism without acute cor pulmonale: Secondary | ICD-10-CM | POA: Diagnosis not present

## 2022-09-21 DIAGNOSIS — I77810 Thoracic aortic ectasia: Secondary | ICD-10-CM | POA: Diagnosis not present

## 2022-09-21 DIAGNOSIS — R9431 Abnormal electrocardiogram [ECG] [EKG]: Secondary | ICD-10-CM | POA: Diagnosis not present

## 2022-09-26 ENCOUNTER — Telehealth: Payer: Self-pay | Admitting: Internal Medicine

## 2022-09-26 MED ORDER — PREDNISONE 10 MG PO TABS: ORAL_TABLET | ORAL | 0 refills | Status: AC

## 2022-09-26 NOTE — Telephone Encounter (Signed)
Called and went over recommendations and she voiced understanding. Nothing further needed

## 2022-09-26 NOTE — Telephone Encounter (Signed)
Called and spoke to patient and she states she has had a cough with clear sputum for 3 days. Nasal and chest congestion. Has 2 more days left of antibiotic (cefdinir). Feels like she can't take a deep breath and her body hurts from coughing so much. Denies fever and has not taken a flu or Covid test. Using nebulizer prn but states she may need neb solution refilled. Would like to know if we can send in something for her to [facility] in [place]. She states she feels she is too sick to come in for an appointment. Please advise.

## 2022-09-26 NOTE — Telephone Encounter (Signed)
She should go back to the AVS from the 1st ov and follow it to the letter with pred 10 mg  x 12 days this time :  Take 4 for three days 3 for three days 2 for three days 1 for three days and stop   F/u in office in 2 weeks with all meds in hand including otcs

## 2022-09-26 NOTE — Telephone Encounter (Signed)
[employer]. Please advise. Doesn't feel like she could come in, but if needed, can have someone bring her.

## 2022-09-27 ENCOUNTER — Telehealth: Payer: Self-pay | Admitting: Internal Medicine

## 2022-09-27 DIAGNOSIS — R0609 Other forms of dyspnea: Secondary | ICD-10-CM

## 2022-09-27 NOTE — Telephone Encounter (Signed)
Called and spoke to patient.  She is asking that we put in a CT scan w/ CON.  She states her blood thinner medication was changed recently and she has had 2 blood clots before and is concerned she may have another blookd clot due to her SOB. Wants to know if we can have  a CT scan ordered for her through [facility] since she is not able to drive. Please advise.

## 2022-09-27 NOTE — Telephone Encounter (Signed)
Fine with me, it would need to be CTangiogram dx doe

## 2022-09-27 NOTE — Telephone Encounter (Signed)
Called and notified patient of response and let her know I would request for the CTA to be done [place] but patient confirmed that if this isn't possible, as a last resort  it is okay to schedule at [facility]. Patient prefers [facility] because she has concerns about driving and transportation from [place] to [place]. Order placed. Nothing further needed at this time.

## 2022-10-03 ENCOUNTER — Telehealth: Payer: Self-pay | Admitting: Internal Medicine

## 2022-10-03 DIAGNOSIS — M48062 Spinal stenosis, lumbar region with neurogenic claudication: Secondary | ICD-10-CM | POA: Diagnosis not present

## 2022-10-03 NOTE — Telephone Encounter (Signed)
This was pt's request so I didn't do d dimer 1s - agree it's reasonable to get this 1st but need to do asap so we can get the test dose by end of week if d dimer is positive and explain to pt we have to follow insurance rules if they are going to pay for it

## 2022-10-03 NOTE — Telephone Encounter (Signed)
I do not see where pt has had a recent d-dimer performed. Dr. Melvyn Novas, please advise on this due to the message from Dr. Donneta Romberg with [facility].

## 2022-10-04 NOTE — Telephone Encounter (Signed)
Bonnie Weeks is aware and trying to reach pt

## 2022-10-04 NOTE — Telephone Encounter (Signed)
Dr Melvyn Novas- I was told by the front office that you spoke with Dr Donneta Romberg. Is there something we need to do or can this encounter be closed? Thanks!

## 2022-10-04 NOTE — Telephone Encounter (Signed)
Attempted to call pt but unable to reach. Left message for her to return call. Also routing this to the [facility] pool for help since pt is a [facility] pt.

## 2022-10-04 NOTE — Telephone Encounter (Signed)
ATC patient. Lmtcb.

## 2022-10-05 NOTE — Telephone Encounter (Signed)
Patient returned call. Went over requirements with patient of labs before Cta and she states that she is better and is no longer having any SOB so she no longer wants to do CTa. Would like to cancel Cta. Nothing further needed.

## 2022-10-10 DIAGNOSIS — W010XXA Fall on same level from slipping, tripping and stumbling without subsequent striking against object, initial encounter: Secondary | ICD-10-CM | POA: Diagnosis not present

## 2022-10-10 DIAGNOSIS — R944 Abnormal results of kidney function studies: Secondary | ICD-10-CM | POA: Diagnosis not present

## 2022-10-10 DIAGNOSIS — W19XXXA Unspecified fall, initial encounter: Secondary | ICD-10-CM | POA: Diagnosis not present

## 2022-10-10 DIAGNOSIS — M5431 Sciatica, right side: Secondary | ICD-10-CM | POA: Diagnosis not present

## 2022-10-10 DIAGNOSIS — J329 Chronic sinusitis, unspecified: Secondary | ICD-10-CM | POA: Diagnosis not present

## 2022-10-10 DIAGNOSIS — G8918 Other acute postprocedural pain: Secondary | ICD-10-CM | POA: Diagnosis not present

## 2022-10-10 DIAGNOSIS — S0990XA Unspecified injury of head, initial encounter: Secondary | ICD-10-CM | POA: Diagnosis not present

## 2022-10-10 DIAGNOSIS — S72142A Displaced intertrochanteric fracture of left femur, initial encounter for closed fracture: Secondary | ICD-10-CM | POA: Diagnosis not present

## 2022-10-10 DIAGNOSIS — S72002A Fracture of unspecified part of neck of left femur, initial encounter for closed fracture: Secondary | ICD-10-CM | POA: Diagnosis not present

## 2022-10-10 DIAGNOSIS — M25562 Pain in left knee: Secondary | ICD-10-CM | POA: Diagnosis not present

## 2022-10-10 DIAGNOSIS — E871 Hypo-osmolality and hyponatremia: Secondary | ICD-10-CM | POA: Diagnosis not present

## 2022-10-10 DIAGNOSIS — S199XXA Unspecified injury of neck, initial encounter: Secondary | ICD-10-CM | POA: Diagnosis not present

## 2022-10-10 DIAGNOSIS — M25552 Pain in left hip: Secondary | ICD-10-CM | POA: Diagnosis not present

## 2022-10-10 DIAGNOSIS — I10 Essential (primary) hypertension: Secondary | ICD-10-CM | POA: Diagnosis not present

## 2022-10-10 DIAGNOSIS — N390 Urinary tract infection, site not specified: Secondary | ICD-10-CM | POA: Diagnosis not present

## 2022-10-10 DIAGNOSIS — D62 Acute posthemorrhagic anemia: Secondary | ICD-10-CM | POA: Diagnosis not present

## 2022-10-10 DIAGNOSIS — K5900 Constipation, unspecified: Secondary | ICD-10-CM | POA: Diagnosis not present

## 2022-10-10 DIAGNOSIS — S72141A Displaced intertrochanteric fracture of right femur, initial encounter for closed fracture: Secondary | ICD-10-CM | POA: Diagnosis not present

## 2022-10-10 DIAGNOSIS — F331 Major depressive disorder, recurrent, moderate: Secondary | ICD-10-CM | POA: Diagnosis not present

## 2022-10-10 DIAGNOSIS — E440 Moderate protein-calorie malnutrition: Secondary | ICD-10-CM | POA: Diagnosis not present

## 2022-10-10 DIAGNOSIS — M48062 Spinal stenosis, lumbar region with neurogenic claudication: Secondary | ICD-10-CM | POA: Diagnosis not present

## 2022-10-10 DIAGNOSIS — S299XXA Unspecified injury of thorax, initial encounter: Secondary | ICD-10-CM | POA: Diagnosis not present

## 2022-10-19 DIAGNOSIS — S72142A Displaced intertrochanteric fracture of left femur, initial encounter for closed fracture: Secondary | ICD-10-CM | POA: Diagnosis not present

## 2022-10-19 DIAGNOSIS — K5900 Constipation, unspecified: Secondary | ICD-10-CM | POA: Diagnosis not present

## 2022-10-19 DIAGNOSIS — E440 Moderate protein-calorie malnutrition: Secondary | ICD-10-CM | POA: Diagnosis not present

## 2022-10-19 DIAGNOSIS — J45909 Unspecified asthma, uncomplicated: Secondary | ICD-10-CM | POA: Diagnosis not present

## 2022-10-19 DIAGNOSIS — R0902 Hypoxemia: Secondary | ICD-10-CM | POA: Diagnosis not present

## 2022-10-19 DIAGNOSIS — I10 Essential (primary) hypertension: Secondary | ICD-10-CM | POA: Diagnosis not present

## 2022-10-19 DIAGNOSIS — J9601 Acute respiratory failure with hypoxia: Secondary | ICD-10-CM | POA: Diagnosis not present

## 2022-10-19 DIAGNOSIS — Z9889 Other specified postprocedural states: Secondary | ICD-10-CM | POA: Diagnosis not present

## 2022-10-19 DIAGNOSIS — R2689 Other abnormalities of gait and mobility: Secondary | ICD-10-CM | POA: Diagnosis not present

## 2022-10-19 DIAGNOSIS — S72142D Displaced intertrochanteric fracture of left femur, subsequent encounter for closed fracture with routine healing: Secondary | ICD-10-CM | POA: Diagnosis not present

## 2022-10-19 DIAGNOSIS — E785 Hyperlipidemia, unspecified: Secondary | ICD-10-CM | POA: Diagnosis not present

## 2022-10-25 ENCOUNTER — Ambulatory Visit: Payer: Medicare PPO | Admitting: Internal Medicine

## 2022-10-30 DIAGNOSIS — S72142A Displaced intertrochanteric fracture of left femur, initial encounter for closed fracture: Secondary | ICD-10-CM | POA: Diagnosis not present

## 2022-10-30 DIAGNOSIS — R262 Difficulty in walking, not elsewhere classified: Secondary | ICD-10-CM | POA: Diagnosis not present

## 2022-11-06 DIAGNOSIS — M25552 Pain in left hip: Secondary | ICD-10-CM | POA: Diagnosis not present

## 2022-11-06 DIAGNOSIS — S72142D Displaced intertrochanteric fracture of left femur, subsequent encounter for closed fracture with routine healing: Secondary | ICD-10-CM | POA: Diagnosis not present

## 2022-11-07 DIAGNOSIS — S72142A Displaced intertrochanteric fracture of left femur, initial encounter for closed fracture: Secondary | ICD-10-CM | POA: Diagnosis not present

## 2022-11-07 DIAGNOSIS — I959 Hypotension, unspecified: Secondary | ICD-10-CM | POA: Diagnosis not present

## 2022-11-07 DIAGNOSIS — E7849 Other hyperlipidemia: Secondary | ICD-10-CM | POA: Diagnosis not present

## 2022-11-07 DIAGNOSIS — Z6832 Body mass index (BMI) 32.0-32.9, adult: Secondary | ICD-10-CM | POA: Diagnosis not present

## 2022-11-07 DIAGNOSIS — M8580 Other specified disorders of bone density and structure, unspecified site: Secondary | ICD-10-CM | POA: Diagnosis not present

## 2022-11-07 DIAGNOSIS — E440 Moderate protein-calorie malnutrition: Secondary | ICD-10-CM | POA: Diagnosis not present

## 2022-11-07 DIAGNOSIS — R030 Elevated blood-pressure reading, without diagnosis of hypertension: Secondary | ICD-10-CM | POA: Diagnosis not present

## 2022-11-07 DIAGNOSIS — D649 Anemia, unspecified: Secondary | ICD-10-CM | POA: Diagnosis not present

## 2022-11-24 ENCOUNTER — Telehealth: Payer: Self-pay | Admitting: *Deleted

## 2022-11-24 NOTE — Telephone Encounter (Signed)
Sorry can't think of anything else to offer as she turned down [place] seeing her today in our clinic - understand transportation issues so best to see local UC  - make sure she is already taking pepcid and chlorpheniramine as rec in addition to  protonix  Take 30-60 min before first meal of the day

## 2022-11-24 NOTE — Telephone Encounter (Signed)
Primary Pulmonologist: Dr. Melvyn Novas  Last office visit and with whom: 08/30/2022 Dr. Melvyn Novas  What do we see them for (pulmonary problems): DOE/ Upper airway cough  Last OV assessment/plan: see below     Reason for call: Patient has been coughing for a few months post covid but has worsened the last month. Coughs up milky/white sputum sometimes. She has been waking up coughing at night. Has bad coughing fits. Has been using nebulizer. She states this helps for a few minutes but not for long. She wants to know if there is something else she can take besides Delsym since delsym is no longer helping. She states she is feeling desperate for relief because the cough is keeping her up at night and takes her breath when she has her coughing spells.   [place]    Allergies  Allergen Reactions   Hydromorphone Anaphylaxis   Procaine Other (See Comments)    Headache    Meloxicam Other (See Comments)    unknown   Oxycodone Nausea And Vomiting    Immunization History  Administered Date(s) Administered   Influenza,inj,Quad PF,6+ Mos 08/25/2013   Influenza-Unspecified 07/22/2015, 09/04/2016, 08/20/2017, 08/28/2018, 11/10/2021   Moderna Sars-Covid-2 Vaccination 09/09/2020, 10/16/2020   Pneumococcal Conjugate-13 12/20/2015   Pneumococcal Polysaccharide-23 11/25/2018    Expand All Collapse All Bonnie Weeks, female    DOB: [birth year]    MRN: [id number]     Brief patient profile:  77  yowf  never smoker but passive smoker with recurrent tonsilitis > removed age 77 and f/u by ENT in [place] referred to pulmonary clinic in [place]  08/04/2022 by Judd Lien MD  for recurrent cough dates to late 77's assoc with freq sinus infections with 3 sinus surgeries by age 77  and also 3 HH surgeries never allergy tested. H/o PE on coumadin as can't afford DOAC.   Prev eval by Dr Felton Clinton with lots of albuterol that didn't help much / couldn't tell singulair helped but prednisone much better temporary  only        History of Present Illness  08/04/2022  Pulmonary/ 1st office eval/ [facility] / [facility]      Chief Complaint  Patient presents with   Consult      She is having some has some clear sputum with the cough   Dyspnea:  pushed buggy around [employer] ok / problem carrying groceries/ slowed by knees /back and sob "about the same point" with activity  Cough: worse when head hits pillow and helps to sip water / white mucus /  assoc heaving and urinary incont / nasal congestion  Sleep: new pattern wakes up every hour since late July 2023 p covid /30 degrees hob  SABA use: not really helpful  Using mints  cough drops and overt HB despite prior NF Rec Omeprazole 40 mg Take 30- 60 min before your first and last meals of the day  GERD diet reviewed, bed blocks   Prednisone 10 mg take  4 each am x 2 days,   2 each am x 2 days,  1 each am x 2 days and stop  Take delsym (otc)  two tsp every 12 hours and supplement if needed with  hydrocodone 10 mg every 4 hours  Please remember to go to the lab department    Eos 0.2 / IgE  157 See your sinus doctor before you return if possible > not seen as of 08/30/2022        08/30/2022  f/u ov/[place] office/[facility] re: late 75s  maint on gerd rx and h1 prn, did not need the delsym or hydrocodone and did not cough at all while on prednisone        Chief Complaint  Patient presents with   Follow-up      Cough has improved since last ov   Dyspnea:  only with lots of vaccuming  Cough: daytime no obvious trigger other than laughter  Sleeping: bed is flat / no noct cough  SABA use: none  02: none  Covid status: infected x 3        No obvious day to day or daytime variability or assoc excess/ purulent sputum or mucus plugs or hemoptysis or cp or chest tightness, subjective wheeze or overt sinus or hb symptoms.    Sleeping  without nocturnal  or early am exacerbation  of respiratory  c/o's or need for noct saba. Also denies any obvious  fluctuation of symptoms with weather or environmental changes or other aggravating or alleviating factors except as outlined above    No unusual exposure hx or h/o childhood pna/ asthma or knowledge of premature birth.   Current Allergies, Complete Past Medical History, Past Surgical History, Family History, and Social History were reviewed in [employer] record.   ROS  The following are not active complaints unless bolded Hoarseness, sore throat, dysphagia, dental problems, itching, sneezing,  nasal congestion or discharge of excess mucus or purulent secretions, ear ache,   fever, chills, sweats, unintended wt loss or wt gain, classically pleuritic or exertional cp,  orthopnea pnd or arm/hand swelling  or leg swelling, presyncope, palpitations, abdominal pain, anorexia, nausea, vomiting, diarrhea  or change in bowel habits or change in bladder habits, change in stools or change in urine, dysuria, hematuria,  rash, arthralgias, visual complaints, headache, numbness, weakness or ataxia or problems with walking or coordination,  change in mood or  memory.         Active Medications      Current Meds  Medication Sig   atorvastatin (LIPITOR) 10 MG tablet Take 10 mg by mouth daily.   Calcium Carb-Cholecalciferol (CALCIUM 600/VITAMIN D PO) Take 1 tablet by mouth daily.   celecoxib (CELEBREX) 200 MG capsule Take 200 mg by mouth daily.   cetirizine (ZYRTEC) 10 MG tablet Take 10 mg by mouth daily.   denosumab (PROLIA) 60 MG/ML SOSY injection Inject 60 mg into the skin every 6 (six) months.   diltiazem (CARDIZEM) 30 MG tablet Take 1 tablet (30 mg total) 3 (three) times daily before meals by mouth. (Patient taking differently: Take 30 mg by mouth daily.)   docusate sodium (COLACE) 100 MG capsule Take 1 capsule (100 mg total) by mouth 2 (two) times daily.   FLUoxetine (PROZAC) 40 MG capsule Take 40 mg by mouth daily.   hydrochlorothiazide (HYDRODIURIL) 25 MG tablet Take 25 mg by  mouth daily.   HYDROcodone-acetaminophen (NORCO) 7.5-325 MG tablet Take 1-2 tablets by mouth every 4 (four) hours as needed for severe pain (pain score 7-10).   losartan-hydrochlorothiazide (HYZAAR) 50-12.5 MG tablet Take 1 tablet by mouth daily.   montelukast (SINGULAIR) 10 MG tablet Take 10 mg by mouth daily.    Multiple Vitamins-Minerals (MULTIVITAMIN WITH MINERALS) tablet Take 1 tablet by mouth daily.   omeprazole (PRILOSEC) 40 MG capsule Take 30- 60 min before your first and last meals of the day   polyethylene glycol (MIRALAX / GLYCOLAX) 17 g packet Take 17 g by mouth daily  as needed for mild constipation.   predniSONE (DELTASONE) 10 MG tablet Take  4 each am x 2 days,   2 each am x 2 days,  1 each am x 2 days and stop   SUMAtriptan 6 MG/0.5ML SOAJ Inject 6 mg into the skin daily as needed (For migraine).   Vibegron (GEMTESA) 75 MG TABS Take 75 mg by mouth daily.   warfarin (COUMADIN) 4 MG tablet Take 4 mg by mouth daily. 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; on lifelong anticoagulation        Objective:         Wt Readings from Last 3 Encounters:  08/30/22 187 lb 9.6 oz (85.1 kg)  08/04/22 187 lb (84.8 kg)  02/22/22 185 lb 3 oz (84 kg)        Vital signs reviewed  08/30/2022  - Note at rest 02 sats  95% on RA    General appearance:    amb wf cough with laugther or deep breath     HEENT : Oropharynx  clear      Nasal turbinates nl      NECK :  without  apparent JVD/ palpable Nodes/TM      LUNGS: no acc muscle use,  Nl contour chest which is  clear to A and P bilaterally without cough on insp or exp maneuvers     CV:  RRR  no s3 or murmur or increase in P2, and no edema    ABD:  soft and nontender with nl inspiratory excursion in the supine position. No bruits or organomegaly appreciated    MS:  Nl gait/ ext warm without deformities Or obvious joint restrictions  calf tenderness, cyanosis or clubbing    SKIN: warm and dry without lesions     NEURO:  alert, approp, nl sensorium with  no motor or cerebellar deficits apparent.                  Assessment          .                      Assessment & Plan Note by Tanda Rockers, MD at 08/30/2022 7:35 PM  Author: Tanda Rockers, MD Author Type: Physician Filed: 08/30/2022  7:38 PM  Note Status: [place] List: Cosign Not Required Encounter Date: 08/30/2022  Problem: DOE (dyspnea on exertion)  Editor: Tanda Rockers, MD (Physician)      Prior Versions: 1. Tanda Rockers, MD (Physician) at 08/30/2022  7:36 PM - Written  Onset p covid July 2023  - CT chest 06/29/22 unremarkable x for min SSAtx -  08/04/2022   Walked on RA  x  2  lap(s) =  approx 300  ft  @ mod  pace, stopped due to back pain with lowest 02 sats 99% and no sob   - 08/30/2022   Walked on RA  x  2  lap(s) =  approx 300  ft  @ slow pace, stopped due to sob with lowest 02 sats 96%    rec continue sub max ex and track 02 sats over time at peak   F/u 6 weeks   Each maintenance medication was reviewed in detail including emphasizing most importantly the difference between maintenance and prns and under what circumstances the prns are to be triggered using an action plan format where appropriate.   Total time for H and P, chart review, counseling,  directly observing portions of ambulatory 02 saturation study/ and generating customized AVS unique to this office visit / same day charting > 30 min                       Assessment & Plan Note by Tanda Rockers, MD at 08/30/2022 7:32 PM  Author:  Tanda Rockers, MD Author Type: Physician Filed: 08/30/2022  7:37 PM  Note Status: [place] List: Cosign Not Required Encounter Date: 08/30/2022  Problem: Upper airway cough syndrome  Editor: Tanda Rockers, MD (Physician)      Prior Versions: 1. Tanda Rockers, MD (Physician) at 08/30/2022  7:33 PM - Written  Onset in her 36s assoc with sinus dz and overt GERD s/p NF and worse since covid July 2023  -  08/04/2022 rec 1st gen H1 blockers per guidelines , max gerd rx and leave off inhalers/ replace with neb xopenex prn plus pred x 6 d and f/u in 2 weeks  - Allergy screen 08/04/2022 >  Eos 0.2 /  IgE  157     >>>   08/30/2022 reported trnasient improvement on pred x 6 d and tol at present but if worse rec  rechallenge with 8 d and consider ICS / complete w/u with ent eval / continue max gerd rx and 1st gen H1 blockers per guidelines          Patient Instructions by Tanda Rockers, MD at 08/30/2022 1:30 PM  Author: Tanda Rockers, MD Author Type: Physician Filed: 08/30/2022  1:47 PM  Note Status: Addendum Cosign: Cosign Not Required Encounter Date: 08/30/2022  Editor: Tanda Rockers, MD (Physician)      Prior Versions: 1. Tanda Rockers, MD (Physician) at 08/30/2022  1:43 PM - Signed  Continue for drainage / throat tickle try take CHLORPHENIRAMINE  4 mg  ("Allergy Relief" '4mg'$   at [facility] should be easiest to find in the blue box usually on bottom shelf)  take one every 4 hours as needed - extremely effective and inexpensive over the counter- may cause drowsiness so start with just a dose or two an hour before bedtime and see how you tolerate it before trying in daytime in place zyrtec      If not better:  Prednisone 10 mg Take 4 for two days three for two days two for two days one for two days      Please schedule a follow up office visit in 6 weeks, call sooner if needed

## 2022-11-24 NOTE — Telephone Encounter (Signed)
Called and gave patient recs. She states she has been taking tessalon perles but they are not helping. Also states she had a broken femur and her doctor told her to avoid steroids.  Scheduled patient for f/u next time you are in [place] 12/05/22   Dr. Melvyn Novas please advise. Are there any other recs for patient?

## 2022-11-24 NOTE — Telephone Encounter (Signed)
Called and notified patient of response and she voiced understanding. She asked to be put on a wait list to be seen sooner if an opening comes up next Thursday. Will send message to [facility] and ask her to put patient on list for any cancellations before appt on 1/16. Nothing further needed.

## 2022-11-24 NOTE — Telephone Encounter (Signed)
Pepcid 20 mg one hour before  bed and For drainage / throat tickle try take CHLORPHENIRAMINE  4 mg  x 2 one hour before bed if not already doing   Prednisone 10 mg take  4 each am x 2 days,   2 each am x 2 days,  1 each am x 2 days and stop   Tessalon 200 mg every 6 hours as needed  #30  Set up f/u ov with all meds in hand next avail

## 2022-11-27 DIAGNOSIS — R5383 Other fatigue: Secondary | ICD-10-CM | POA: Diagnosis not present

## 2022-11-27 DIAGNOSIS — J209 Acute bronchitis, unspecified: Secondary | ICD-10-CM | POA: Diagnosis not present

## 2022-11-27 DIAGNOSIS — R059 Cough, unspecified: Secondary | ICD-10-CM | POA: Diagnosis not present

## 2022-11-29 ENCOUNTER — Telehealth: Payer: Self-pay | Admitting: Internal Medicine

## 2022-11-29 NOTE — Telephone Encounter (Signed)
Called and spoke with pt. Let her know that I would cancel the 1/16 appt and told her to keep the 2/15 appt and she verbalized understanding. Nothing further needed.

## 2022-12-05 ENCOUNTER — Ambulatory Visit: Payer: Medicare PPO | Admitting: Internal Medicine

## 2022-12-05 DIAGNOSIS — S72142D Displaced intertrochanteric fracture of left femur, subsequent encounter for closed fracture with routine healing: Secondary | ICD-10-CM | POA: Diagnosis not present

## 2023-01-04 ENCOUNTER — Ambulatory Visit (INDEPENDENT_AMBULATORY_CARE_PROVIDER_SITE_OTHER): Payer: Medicare PPO | Admitting: Internal Medicine

## 2023-01-04 ENCOUNTER — Encounter: Payer: Self-pay | Admitting: Internal Medicine

## 2023-01-04 VITALS — BP 142/78 | HR 78 | Ht 63.5 in | Wt 177.0 lb

## 2023-01-04 DIAGNOSIS — R058 Other specified cough: Secondary | ICD-10-CM

## 2023-01-04 MED ORDER — ARNUITY ELLIPTA 100 MCG/ACT IN AEPB
INHALATION_SPRAY | RESPIRATORY_TRACT | 11 refills | Status: AC
Start: 2023-01-04 — End: ?

## 2023-01-04 NOTE — Progress Notes (Signed)
Bonnie Weeks, female    DOB: 1946-01-28    MRN: [id number]   Brief patient profile:  37  yowf  never smoker but passive smoke exposure as child  with recurrent tonsilitis > removed age 77 and f/u by ENT in [place] referred to pulmonary clinic in [place]  08/04/2022 by Judd Lien MD  for recurrent cough dates to late 20's assoc with freq sinus infections with 3 sinus surgeries by age 72  and also 3 HH surgeries never allergy tested. H/o PE on coumadin as can't afford DOAC.  Prev eval by Dr Felton Clinton with lots of albuterol that didn't help much / couldn't tell singulair helped but prednisone much better temporary only     History of Present Illness  08/04/2022  Pulmonary/ 1st office eval/ [facility] Klemp / [place] [facility]  Chief Complaint  Patient presents with   Consult    She is having some has some clear sputum with the cough   Dyspnea:  pushed buggy around [employer] ok / problem carrying groceries/ slowed by knees /back and sob "about the same point" with activity  Cough: worse when head hits pillow and helps to sip water / white mucus /  assoc heaving and urinary incont / nasal congestion  Sleep: new pattern wakes up every hour since late July 2023 p covid /30 degrees hob  SABA use: not really helpful  Using mints  cough drops and overt HB despite prior NF Rec Omeprazole 40 mg Take 30- 60 min before your first and last meals of the day  GERD diet reviewed, bed blocks   Prednisone 10 mg take  4 each am x 2 days,   2 each am x 2 days,  1 each am x 2 days and stop  Take delsym (otc)  two tsp every 12 hours and supplement if needed with  hydrocodone 10 mg every 4 hours  Please remember to go to the lab department    Eos 0.2 / IgE  157 See your sinus doctor before you return if possible > not seen as of 08/30/2022     08/30/2022  f/u ov/[place] office/[place] [facility]ck re: late 69s  maint on gerd rx and h1 prn, did not need the delsym or hydrocodone and did not cough at all while on prednisone     Chief Complaint  Patient presents with   Follow-up    Cough has improved since last ov   Dyspnea:  only with lots of vaccuming  Cough: daytime no obvious trigger other than laughter  Sleeping: bed is flat / no noct cough  SABA use: none  02: none  Covid status: infected x 3  Rec Continue for drainage / throat tickle try take CHLORPHENIRAMINE  4 mg  If not better:  Prednisone 10 mg Take 4 for two days three for two days two for two days one for two days / consider ICS next ov    01/04/2023  f/u ov/[place] [facility]/Jamario Colina re: cough  maint on arnuity 100 until one week  prior to OV  and ran out and no worse off it    Chief Complaint  Patient presents with   Follow-up    Cannot smell since December. Was negative for Covid  Feels breathing is doing well   Dyspnea:  limited L leg fx 2 days prior to [place] 2023 >  walker pace s sob Cough: gone  Sleeping: electric slt elevation  SABA use: none needed  02: none  Covid status: vax x  3 / infected summer 2023      No obvious day to day or daytime variability or assoc excess/ purulent sputum or mucus plugs or hemoptysis or cp or chest tightness, subjective wheeze or overt sinus or hb symptoms.   Sleeping  without nocturnal  or early am exacerbation  of respiratory  c/o's or need for noct saba. Also denies any obvious fluctuation of symptoms with weather or environmental changes or other aggravating or alleviating factors except as outlined above   No unusual exposure hx or h/o childhood pna/ asthma or knowledge of premature birth.  Current Allergies, Complete Past Medical History, Past Surgical History, Family History, and Social History were reviewed in [employer] record.  ROS  The following are not active complaints unless bolded Hoarseness, sore throat, dysphagia, dental problems, itching, sneezing,  nasal congestion or discharge of excess mucus or purulent secretions, ear ache,   fever, chills, sweats,  unintended wt loss or wt gain, classically pleuritic or exertional cp,  orthopnea pnd or arm/hand swelling  or leg swelling, presyncope, palpitations, abdominal pain, anorexia, nausea, vomiting, diarrhea  or change in bowel habits or change in bladder habits, change in stools or change in urine, dysuria, hematuria,  rash, arthralgias, visual complaints, headache, numbness, weakness or ataxia or problems with walking or coordination,  change in mood or  memory.        Current Meds  Medication Sig   atorvastatin (LIPITOR) 10 MG tablet Take 10 mg by mouth daily.   Calcium Carb-Cholecalciferol (CALCIUM 600/VITAMIN D PO) Take 1 tablet by mouth daily.   celecoxib (CELEBREX) 200 MG capsule Take 200 mg by mouth daily.   cetirizine (ZYRTEC) 10 MG tablet Take 10 mg by mouth daily.   denosumab (PROLIA) 60 MG/ML SOSY injection Inject 60 mg into the skin every 6 (six) months.   diltiazem (CARDIZEM) 30 MG tablet Take 1 tablet (30 mg total) 3 (three) times daily before meals by mouth. (Patient taking differently: Take 30 mg by mouth daily.)   docusate sodium (COLACE) 100 MG capsule Take 1 capsule (100 mg total) by mouth 2 (two) times daily.   FLUoxetine (PROZAC) 40 MG capsule Take 40 mg by mouth daily.   hydrochlorothiazide (HYDRODIURIL) 25 MG tablet Take 25 mg by mouth daily.   HYDROcodone-acetaminophen (NORCO) 7.5-325 MG tablet Take 1-2 tablets by mouth every 4 (four) hours as needed for severe pain (pain score 7-10).   losartan-hydrochlorothiazide (HYZAAR) 50-12.5 MG tablet Take 1 tablet by mouth daily.   montelukast (SINGULAIR) 10 MG tablet Take 10 mg by mouth daily.    Multiple Vitamins-Minerals (MULTIVITAMIN WITH MINERALS) tablet Take 1 tablet by mouth daily.   omeprazole (PRILOSEC) 40 MG capsule Take 30- 60 min before your first and last meals of the day   polyethylene glycol (MIRALAX / GLYCOLAX) 17 g packet Take 17 g by mouth daily as needed for mild constipation.   predniSONE (DELTASONE) 10 MG tablet  Take 4 for two days three for two days two for two days one for two days   SUMAtriptan 6 MG/0.5ML SOAJ Inject 6 mg into the skin daily as needed (For migraine).   Vibegron (GEMTESA) 75 MG TABS Take 75 mg by mouth daily.   warfarin (COUMADIN) 4 MG tablet Take 4 mg by mouth daily. 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; on lifelong anticoagulation      Objective:    Wts   01/04/2023        177   08/30/22 187 lb 9.6 oz (85.1 kg)  08/04/22 187 lb (84.8 kg)  02/22/22 185 lb 3 oz (84 kg)    Vital signs reviewed  01/04/2023  - Note at rest 02 sats  95% on RA   General appearance:    amb with walker/ nasal tone to voice     HEENT : Oropharynx  clear       NECK :  without  apparent JVD/ palpable Nodes/TM    LUNGS: no acc muscle use,  Nl contour chest which is clear to A and P bilaterally without cough on insp or exp maneuvers   CV:  RRR  no s3 or murmur or increase in P2, and no edema   ABD:  soft and nontender with nl inspiratory excursion in the supine position. No bruits or organomegaly appreciated   MS:  Nl gait/ ext warm without deformities Or obvious joint restrictions  calf tenderness, cyanosis or clubbing    SKIN: warm and dry without lesions    NEURO:  alert, approp, nl sensorium with  no motor or cerebellar deficits apparent.         Assessment       .

## 2023-01-04 NOTE — Assessment & Plan Note (Addendum)
Onset in her 7s assoc with sinus dz and overt GERD s/p NF and worse since covid July 2023  -  08/04/2022 rec 1st gen H1 blockers per guidelines , max gerd rx and leave off inhalers/ replace with neb xopenex prn plus pred x 6 d and f/u in 2 weeks  - Allergy screen 08/04/2022 >  Eos 0.2 /  IgE  157 - 08/30/2022 reported transient mprovement on pred x 6 d so if worse rechallenge with 8 d and consider ICS / complete w/u with ent eval  - 01/04/2023 improved p one month arnuity per PCP   Suspect now she has elements of both cough variant asthma (clue is h/o pred responsive cough) and uacs which typically doesn't do well with arnuity or dpi's so feel problem now is more asthma than uacs.   Rec she restart the arnuity at the onset of any cough recurrence to see if results are reproducible and whether she can tol the upper airwy effects long term or not      Each maintenance medication was reviewed in detail including emphasizing most importantly the difference between maintenance and prns and under what circumstances the prns are to be triggered using an action plan format where appropriate.  Total time for H and P, chart review, counseling, reviewing DPI  device(s) and generating customized AVS unique to this office visit / same day charting > 30 min

## 2023-01-04 NOTE — Patient Instructions (Addendum)
Ok to restart Arnuity [id number] one click each am if cough comes back   Remember to do your dental care after your arnuity   Pulmonary follow up is as needed

## 2023-01-11 DIAGNOSIS — J45991 Cough variant asthma: Secondary | ICD-10-CM | POA: Diagnosis not present

## 2023-01-11 DIAGNOSIS — R058 Other specified cough: Secondary | ICD-10-CM | POA: Diagnosis not present

## 2023-01-11 DIAGNOSIS — R030 Elevated blood-pressure reading, without diagnosis of hypertension: Secondary | ICD-10-CM | POA: Diagnosis not present

## 2023-01-11 DIAGNOSIS — R430 Anosmia: Secondary | ICD-10-CM | POA: Diagnosis not present

## 2023-01-11 DIAGNOSIS — S72142D Displaced intertrochanteric fracture of left femur, subsequent encounter for closed fracture with routine healing: Secondary | ICD-10-CM | POA: Diagnosis not present

## 2023-01-11 DIAGNOSIS — N3281 Overactive bladder: Secondary | ICD-10-CM | POA: Diagnosis not present

## 2023-01-11 DIAGNOSIS — R432 Parageusia: Secondary | ICD-10-CM | POA: Diagnosis not present

## 2023-01-11 DIAGNOSIS — Z6831 Body mass index (BMI) 31.0-31.9, adult: Secondary | ICD-10-CM | POA: Diagnosis not present

## 2023-01-16 DIAGNOSIS — S72142D Displaced intertrochanteric fracture of left femur, subsequent encounter for closed fracture with routine healing: Secondary | ICD-10-CM | POA: Diagnosis not present

## 2023-01-18 DIAGNOSIS — M4316 Spondylolisthesis, lumbar region: Secondary | ICD-10-CM | POA: Diagnosis not present

## 2023-01-24 DIAGNOSIS — M4316 Spondylolisthesis, lumbar region: Secondary | ICD-10-CM | POA: Diagnosis not present

## 2023-02-01 DIAGNOSIS — M4316 Spondylolisthesis, lumbar region: Secondary | ICD-10-CM | POA: Diagnosis not present

## 2023-02-08 DIAGNOSIS — M4316 Spondylolisthesis, lumbar region: Secondary | ICD-10-CM | POA: Diagnosis not present

## 2023-02-13 DIAGNOSIS — S72142D Displaced intertrochanteric fracture of left femur, subsequent encounter for closed fracture with routine healing: Secondary | ICD-10-CM | POA: Diagnosis not present

## 2023-02-13 DIAGNOSIS — M7062 Trochanteric bursitis, left hip: Secondary | ICD-10-CM | POA: Diagnosis not present

## 2023-02-21 DIAGNOSIS — M4316 Spondylolisthesis, lumbar region: Secondary | ICD-10-CM | POA: Diagnosis not present

## 2023-02-28 DIAGNOSIS — M4316 Spondylolisthesis, lumbar region: Secondary | ICD-10-CM | POA: Diagnosis not present

## 2023-03-13 DIAGNOSIS — M7062 Trochanteric bursitis, left hip: Secondary | ICD-10-CM | POA: Diagnosis not present

## 2023-03-13 DIAGNOSIS — S72142D Displaced intertrochanteric fracture of left femur, subsequent encounter for closed fracture with routine healing: Secondary | ICD-10-CM | POA: Diagnosis not present

## 2023-03-21 DIAGNOSIS — M25552 Pain in left hip: Secondary | ICD-10-CM | POA: Diagnosis not present

## 2023-03-21 DIAGNOSIS — M84459D Pathological fracture, hip, unspecified, subsequent encounter for fracture with routine healing: Secondary | ICD-10-CM | POA: Diagnosis not present

## 2023-03-28 DIAGNOSIS — M84459D Pathological fracture, hip, unspecified, subsequent encounter for fracture with routine healing: Secondary | ICD-10-CM | POA: Diagnosis not present

## 2023-03-28 DIAGNOSIS — M25552 Pain in left hip: Secondary | ICD-10-CM | POA: Diagnosis not present

## 2023-04-04 DIAGNOSIS — M25552 Pain in left hip: Secondary | ICD-10-CM | POA: Diagnosis not present

## 2023-04-04 DIAGNOSIS — M84459D Pathological fracture, hip, unspecified, subsequent encounter for fracture with routine healing: Secondary | ICD-10-CM | POA: Diagnosis not present

## 2023-04-11 DIAGNOSIS — M84459D Pathological fracture, hip, unspecified, subsequent encounter for fracture with routine healing: Secondary | ICD-10-CM | POA: Diagnosis not present

## 2023-04-11 DIAGNOSIS — M25552 Pain in left hip: Secondary | ICD-10-CM | POA: Diagnosis not present

## 2023-04-30 DIAGNOSIS — M84459D Pathological fracture, hip, unspecified, subsequent encounter for fracture with routine healing: Secondary | ICD-10-CM | POA: Diagnosis not present

## 2023-04-30 DIAGNOSIS — M25552 Pain in left hip: Secondary | ICD-10-CM | POA: Diagnosis not present

## 2023-05-01 DIAGNOSIS — M5416 Radiculopathy, lumbar region: Secondary | ICD-10-CM | POA: Diagnosis not present

## 2023-05-01 DIAGNOSIS — M5136 Other intervertebral disc degeneration, lumbar region: Secondary | ICD-10-CM | POA: Diagnosis not present

## 2023-05-02 DIAGNOSIS — E559 Vitamin D deficiency, unspecified: Secondary | ICD-10-CM | POA: Diagnosis not present

## 2023-05-02 DIAGNOSIS — Z0001 Encounter for general adult medical examination with abnormal findings: Secondary | ICD-10-CM | POA: Diagnosis not present

## 2023-05-02 DIAGNOSIS — E7849 Other hyperlipidemia: Secondary | ICD-10-CM | POA: Diagnosis not present

## 2023-05-02 DIAGNOSIS — Z1329 Encounter for screening for other suspected endocrine disorder: Secondary | ICD-10-CM | POA: Diagnosis not present

## 2023-05-02 DIAGNOSIS — I10 Essential (primary) hypertension: Secondary | ICD-10-CM | POA: Diagnosis not present

## 2023-05-07 DIAGNOSIS — M4316 Spondylolisthesis, lumbar region: Secondary | ICD-10-CM | POA: Diagnosis not present

## 2023-05-09 DIAGNOSIS — R432 Parageusia: Secondary | ICD-10-CM | POA: Diagnosis not present

## 2023-05-09 DIAGNOSIS — Z0001 Encounter for general adult medical examination with abnormal findings: Secondary | ICD-10-CM | POA: Diagnosis not present

## 2023-05-09 DIAGNOSIS — E7849 Other hyperlipidemia: Secondary | ICD-10-CM | POA: Diagnosis not present

## 2023-05-09 DIAGNOSIS — F339 Major depressive disorder, recurrent, unspecified: Secondary | ICD-10-CM | POA: Diagnosis not present

## 2023-05-09 DIAGNOSIS — J45991 Cough variant asthma: Secondary | ICD-10-CM | POA: Diagnosis not present

## 2023-05-09 DIAGNOSIS — Z23 Encounter for immunization: Secondary | ICD-10-CM | POA: Diagnosis not present

## 2023-05-09 DIAGNOSIS — R058 Other specified cough: Secondary | ICD-10-CM | POA: Diagnosis not present

## 2023-05-09 DIAGNOSIS — I10 Essential (primary) hypertension: Secondary | ICD-10-CM | POA: Diagnosis not present

## 2023-05-09 DIAGNOSIS — R430 Anosmia: Secondary | ICD-10-CM | POA: Diagnosis not present

## 2023-05-09 DIAGNOSIS — N3281 Overactive bladder: Secondary | ICD-10-CM | POA: Diagnosis not present

## 2023-05-14 DIAGNOSIS — M4316 Spondylolisthesis, lumbar region: Secondary | ICD-10-CM | POA: Diagnosis not present

## 2023-06-15 DIAGNOSIS — Z6831 Body mass index (BMI) 31.0-31.9, adult: Secondary | ICD-10-CM | POA: Diagnosis not present

## 2023-06-15 DIAGNOSIS — M5416 Radiculopathy, lumbar region: Secondary | ICD-10-CM | POA: Diagnosis not present

## 2023-06-15 DIAGNOSIS — M48062 Spinal stenosis, lumbar region with neurogenic claudication: Secondary | ICD-10-CM | POA: Diagnosis not present

## 2023-07-05 DIAGNOSIS — M48062 Spinal stenosis, lumbar region with neurogenic claudication: Secondary | ICD-10-CM | POA: Diagnosis not present

## 2023-08-01 DIAGNOSIS — Z6831 Body mass index (BMI) 31.0-31.9, adult: Secondary | ICD-10-CM | POA: Diagnosis not present

## 2023-08-01 DIAGNOSIS — M4316 Spondylolisthesis, lumbar region: Secondary | ICD-10-CM | POA: Diagnosis not present

## 2023-08-01 DIAGNOSIS — M5416 Radiculopathy, lumbar region: Secondary | ICD-10-CM | POA: Diagnosis not present

## 2023-08-13 ENCOUNTER — Other Ambulatory Visit: Payer: Self-pay | Admitting: Neurological Surgery

## 2023-08-17 NOTE — Progress Notes (Signed)
Surgical Instructions   Your procedure is scheduled on Tuesday, 08/28/23. Report to [facility] Main Entrance "A" at 9:00 A.M., then check in with the Admitting office. Any questions or running late day of surgery: call [phone]  Questions prior to your surgery date: call [phone], Monday-Friday, 8am-4pm. If you experience any cold or flu symptoms such as cough, fever, chills, shortness of breath, etc. between now and your scheduled surgery, please notify us at the above number.     Remember:  Do not eat or drink after midnight the night before your surgery    Take these medicines the morning of surgery with A SIP OF WATER  atorvastatin (LIPITOR)  cetirizine (ZYRTEC)  diltiazem (CARDIZEM)  FLUoxetine (PROZAC)  montelukast (SINGULAIR)  omeprazole (PRILOSEC)  Vibegron (GEMTESA)   May take these medicines IF NEEDED: Fluticasone Furoate (ARNUITY ELLIPTA) HYDROcodone-acetaminophen (NORCO)  methocarbamol (ROBAXIN)  SUMAtriptan   One week prior to surgery, STOP taking any Aspirin (unless otherwise instructed by your surgeon) Aleve, Naproxen, Ibuprofen, Motrin, Advil, Goody's, BC's, all herbal medications, fish oil, and non-prescription vitamins. This includes celecoxib (CELEBREX).  Stop taking XARELTO 2 days prior to surgery. Your last dose will be 08/25/23.                     Do NOT Smoke (Tobacco/Vaping) for 24 hours prior to your procedure.  If you use a CPAP at night, you may bring your mask/headgear for your overnight stay.   You will be asked to remove any contacts, glasses, piercing's, hearing aid's, dentures/partials prior to surgery. Please bring cases for these items if needed.    Patients discharged the day of surgery will not be allowed to drive home, and someone needs to stay with them for 24 hours.  SURGICAL WAITING ROOM VISITATION Patients may have no more than 2 support people in the waiting area - these visitors may rotate.   Pre-op nurse will coordinate  an appropriate time for 1 ADULT support person, who may not rotate, to accompany patient in pre-op.  Children under the age of 62 must have an adult with them who is not the patient and must remain in the main waiting area with an adult.  If the patient needs to stay at the hospital during part of their recovery, the visitor guidelines for inpatient rooms apply.  Please refer to the [facility] website for the visitor guidelines for any additional information.   If you received a COVID test during your pre-op visit  it is requested that you wear a mask when out in public, stay away from anyone that may not be feeling well and notify your surgeon if you develop symptoms. If you have been in contact with anyone that has tested positive in the last 10 days please notify you surgeon.      Pre-operative 5 CHG Bathing Instructions   You can play a key role in reducing the risk of infection after surgery. Your skin needs to be as free of germs as possible. You can reduce the number of germs on your skin by washing with CHG (chlorhexidine gluconate) soap before surgery. CHG is an antiseptic soap that kills germs and continues to kill germs even after washing.   DO NOT use if you have an allergy to chlorhexidine/CHG or antibacterial soaps. If your skin becomes reddened or irritated, stop using the CHG and notify one of our RNs at [phone].   Please shower with the CHG soap starting 4 days before surgery  using the following schedule:     Please keep in mind the following:  DO NOT shave, including legs and underarms, starting the day of your first shower.   You may shave your face at any point before/day of surgery.  Place clean sheets on your bed the day you start using CHG soap. Use a clean washcloth (not used since being washed) for each shower. DO NOT sleep with pets once you start using the CHG.   CHG Shower Instructions:  Wash your face and private area with normal soap. If you choose  to wash your hair, wash first with your normal shampoo.  After you use shampoo/soap, rinse your hair and body thoroughly to remove shampoo/soap residue.  Turn the water OFF and apply about 3 tablespoons (45 ml) of CHG soap to a CLEAN washcloth.  Apply CHG soap ONLY FROM YOUR NECK DOWN TO YOUR TOES (washing for 3-5 minutes)  DO NOT use CHG soap on face, private areas, open wounds, or sores.  Pay special attention to the area where your surgery is being performed.  If you are having back surgery, having someone wash your back for you may be helpful. Wait 2 minutes after CHG soap is applied, then you may rinse off the CHG soap.  Pat dry with a clean towel  Put on clean clothes/pajamas   If you choose to wear lotion, please use ONLY the CHG-compatible lotions on the back of this paper.   Additional instructions for the day of surgery: DO NOT APPLY any lotions, deodorants, cologne, or perfumes.   Do not bring valuables to the hospital. [facility] is not responsible for any belongings/valuables. Do not wear nail polish, gel polish, artificial nails, or any other type of covering on natural nails (fingers and toes) Do not wear jewelry or makeup Put on clean/comfortable clothes.  Please brush your teeth.  Ask your nurse before applying any prescription medications to the skin.     CHG Compatible Lotions   Aveeno Moisturizing lotion  Cetaphil Moisturizing Cream  Cetaphil Moisturizing Lotion  Clairol Herbal Essence Moisturizing Lotion, Dry Skin  Clairol Herbal Essence Moisturizing Lotion, Extra Dry Skin  Clairol Herbal Essence Moisturizing Lotion, Normal Skin  Curel Age Defying Therapeutic Moisturizing Lotion with Alpha Hydroxy  Curel Extreme Care Body Lotion  Curel Soothing Hands Moisturizing Hand Lotion  Curel Therapeutic Moisturizing Cream, Fragrance-Free  Curel Therapeutic Moisturizing Lotion, Fragrance-Free  Curel Therapeutic Moisturizing Lotion, Original Formula  Eucerin Daily  Replenishing Lotion  Eucerin Dry Skin Therapy Plus Alpha Hydroxy Crme  Eucerin Dry Skin Therapy Plus Alpha Hydroxy Lotion  Eucerin Original Crme  Eucerin Original Lotion  Eucerin Plus Crme Eucerin Plus Lotion  Eucerin TriLipid Replenishing Lotion  Keri Anti-Bacterial Hand Lotion  Keri Deep Conditioning Original Lotion Dry Skin Formula Softly Scented  Keri Deep Conditioning Original Lotion, Fragrance Free Sensitive Skin Formula  Keri Lotion Fast Absorbing Fragrance Free Sensitive Skin Formula  Keri Lotion Fast Absorbing Softly Scented Dry Skin Formula  Keri Original Lotion  Keri Skin Renewal Lotion Keri Silky Smooth Lotion  Keri Silky Smooth Sensitive Skin Lotion  Nivea Body Creamy Conditioning Oil  Nivea Body Extra Enriched Lotion  Nivea Body Original Lotion  Nivea Body Sheer Moisturizing Lotion Nivea Crme  Nivea Skin Firming Lotion  NutraDerm 30 Skin Lotion  NutraDerm Skin Lotion  NutraDerm Therapeutic Skin Cream  NutraDerm Therapeutic Skin Lotion  ProShield Protective Hand Cream  Provon moisturizing lotion  Please read over the following fact sheets that you were  given.

## 2023-08-20 ENCOUNTER — Other Ambulatory Visit: Payer: Self-pay

## 2023-08-20 ENCOUNTER — Encounter (HOSPITAL_COMMUNITY): Payer: Self-pay

## 2023-08-20 ENCOUNTER — Other Ambulatory Visit: Payer: Self-pay | Admitting: Neurological Surgery

## 2023-08-20 ENCOUNTER — Encounter (HOSPITAL_COMMUNITY)
Admission: RE | Admit: 2023-08-20 | Discharge: 2023-08-20 | Disposition: A | Discharge status: Home / Self Care | Payer: Medicare PPO | Source: Ambulatory Visit | Attending: Neurological Surgery | Admitting: Neurological Surgery

## 2023-08-20 VITALS — BP 137/66 | HR 73 | Temp 98.1°F | Resp 18 | Ht 63.0 in | Wt 181.0 lb

## 2023-08-20 DIAGNOSIS — Z01812 Encounter for preprocedural laboratory examination: Secondary | ICD-10-CM | POA: Diagnosis not present

## 2023-08-20 DIAGNOSIS — J45909 Unspecified asthma, uncomplicated: Secondary | ICD-10-CM | POA: Diagnosis not present

## 2023-08-20 DIAGNOSIS — Z923 Personal history of irradiation: Secondary | ICD-10-CM | POA: Insufficient documentation

## 2023-08-20 DIAGNOSIS — K219 Gastro-esophageal reflux disease without esophagitis: Secondary | ICD-10-CM | POA: Diagnosis not present

## 2023-08-20 DIAGNOSIS — Z7901 Long term (current) use of anticoagulants: Secondary | ICD-10-CM | POA: Insufficient documentation

## 2023-08-20 DIAGNOSIS — Z86711 Personal history of pulmonary embolism: Secondary | ICD-10-CM | POA: Diagnosis not present

## 2023-08-20 DIAGNOSIS — E785 Hyperlipidemia, unspecified: Secondary | ICD-10-CM | POA: Diagnosis not present

## 2023-08-20 DIAGNOSIS — I10 Essential (primary) hypertension: Secondary | ICD-10-CM | POA: Diagnosis not present

## 2023-08-20 DIAGNOSIS — M4316 Spondylolisthesis, lumbar region: Secondary | ICD-10-CM | POA: Insufficient documentation

## 2023-08-20 DIAGNOSIS — Z01818 Encounter for other preprocedural examination: Secondary | ICD-10-CM

## 2023-08-20 DIAGNOSIS — I2510 Atherosclerotic heart disease of native coronary artery without angina pectoris: Secondary | ICD-10-CM

## 2023-08-20 DIAGNOSIS — Z853 Personal history of malignant neoplasm of breast: Secondary | ICD-10-CM | POA: Insufficient documentation

## 2023-08-20 HISTORY — DX: Cardiac murmur, unspecified: R01.1

## 2023-08-20 LAB — BASIC METABOLIC PANEL
Anion gap: 9 (ref 5–15)
BUN: 13 mg/dL (ref 8–23)
CO2: 25 mmol/L (ref 22–32)
Calcium: 9.2 mg/dL (ref 8.9–10.3)
Chloride: 103 mmol/L (ref 98–111)
Creatinine, Ser: 0.71 mg/dL (ref 0.44–1.00)
GFR, Estimated: >60 mL/min (ref >60)
Glucose, Bld: 95 mg/dL (ref 70–99)
Potassium: 3.8 mmol/L (ref 3.5–5.1)
Sodium: 137 mmol/L (ref 135–145)

## 2023-08-20 LAB — CBC
HCT: 38.7 % (ref 36.0–46.0)
Hemoglobin: 12.4 g/dL (ref 12.0–15.0)
MCH: 28.8 pg (ref 26.0–34.0)
MCHC: 32 g/dL (ref 30.0–36.0)
MCV: 89.8 fL (ref 80.0–100.0)
Platelets: 290 10*3/uL (ref 150–400)
RBC: 4.31 MIL/uL (ref 3.87–5.11)
RDW: 13.9 % (ref 11.5–15.5)
WBC: 5.6 10*3/uL (ref 4.0–10.5)
nRBC: 0 % (ref 0.0–0.2)

## 2023-08-20 LAB — TYPE AND SCREEN
ABO/RH(D): A POS
Antibody Screen: NEGATIVE

## 2023-08-20 LAB — SURGICAL PCR SCREEN
MRSA, PCR: NEGATIVE
Staphylococcus aureus: NEGATIVE

## 2023-08-20 NOTE — Progress Notes (Signed)
PCP - Dr. Quintin Alto Cardiologist - Dr. Yehuda Budd  PPM/ICD - Denies Device Orders - n/a Rep Notified - n/a  Chest x-ray - Denies EKG - Per pt, November 2023 (Tracing requested) Stress Test - Per pt, many years ago. Result normal ECHO - Per pt, completed one in the last few years. Result requested Cardiac Cath - Per pt, many years ago. Result normal.  Sleep Study - Denies CPAP - n/a  No DM  Last dose of GLP1 agonist- n/a GLP1 instructions: n/a  Blood Thinner Instructions: Per pt, instructed to hold Xarelto 3 days. Pt said she will hold 4 days, which makes last dose 10/3. Aspirin Instructions: n/a  NPO after midnight  COVID TEST- n/a   Anesthesia review: Yes. Cardiac Clearance and cardiac testing requested.  Patient denies shortness of breath, fever, cough and chest pain at PAT appointment. Pt denies any respiratory illness/infection in the last two months.    All instructions explained to the patient, with a verbal understanding of the material. Patient agrees to go over the instructions while at home for a better understanding. Patient also instructed to self quarantine after being tested for COVID-19. The opportunity to ask questions was provided.

## 2023-08-21 ENCOUNTER — Other Ambulatory Visit: Payer: Self-pay | Admitting: Neurological Surgery

## 2023-08-21 NOTE — Progress Notes (Signed)
Anesthesia Chart Review:  Case: [id number] Date/Time: 08/28/23 1042   Procedures:      OPEN LUMBAR DECOMP, TLIF L1-2, EXTEND INSTRUMENTATION T10-L2,EXPLORE FUSION - 3C     Application of O-Arm   Anesthesia type: General   Pre-op diagnosis: SPONDYLOLISTHESIS, LUMBAR REGION   [facility] ROOM [facility] / [facility] OR   Surgeons: Dawley, Alan Mulder, DO       DISCUSSION: Patient is a 77 year old female scheduled for the above procedure.  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  PCP Dr. Leandrew Koyanagi classified patient as low risk with permission to hold Xarelto prior to surgery.  He recommended preoperative cardiology input as well.  Preoperative cardiology input obtained by Dr. Foye Deer.  Letter dated 08/08/2023 stated: "This is to inform you that the above-mentioned patient has moderately good exercise tolerance and no symptoms of angina.  This patient had an echocardiogram performed in 06/2021 which revealed normal left ventricular function, EF 60-65%, normal RV function, normal pulmonary artery function, no significant valvular disease.  I feel this patient is low risk from a cardiac standpoint to proceed with the surgery.  She may hold her Xarelto for 48 hours prior to the procedure and resume it 24 hours after the procedure." She reported instructions to hold for 3 days prior to surgery.  Anesthesia team to evaluate on the day of surgery.    VS: BP 137/66   Pulse 73   Temp 36.7 C   Resp 18   Ht 5\' 3"  (1.6 m)   Wt 82.1 kg   SpO2 97%   BMI 32.06 kg/m    PROVIDERS: Burdine, Ananias Pilgrim, MD is PCP  Yehuda Budd, MD is cardiologist ([facility])   LABS: Labs reviewed: Acceptable for surgery. (all  labs ordered are listed, but only abnormal results are displayed)  Labs Reviewed  SURGICAL PCR SCREEN  BASIC METABOLIC PANEL  CBC  TYPE AND SCREEN    IMAGES: CTA chest 06/29/22 ([facility]): Impression: 1.  No acute intrathoracic findings.  Specifically no evidence for pulmonary embolism, pneumonia, or pulmonary edema. 2.  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 3.  Chronic and/or incidental findings, detailed above. [Report on shadow chart]   EKG: 09/21/2022 ([facility]): Copy on shadow chart. Sinus rhythm Inferior ST elevation -repolarization variant T wave abnormality, consider anteroseptal/anterior ischemia   CV: Echo 06/27/21 ([facility]): Copy on shadow chart. 1. Normal LV size with normal systolic function. EF 60-65%. Grade 1 diastolic dysfunction. Normal LV wall thickness. 2. Normal RV size with normal function. 3. Abnormal, trileaflet aortic valve. There is mild aortic stenosis. The aortic valve is thickened and calcified.  4. Mild mitral regurgitation. 5. Mild tricuspid regurgitation. 6. Normal pulmonary artery systolic pressure (PASP equals 27 mmHg) 7. Mild pulmonic regurgitation.   She reported a remote history of a normal stress test and cardiac cath.    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; on lifelong anticoagulation    Past Surgical History:  Procedure Laterality Date   ANTERIOR CERVICAL DECOMP/DISCECTOMY FUSION  11/2014   "[place]"   ANTERIOR LAT LUMBAR FUSION Left 11/09/2015   Procedure: Lumbar two-three,lumbar three-four.lumbar four-five  Anterior Lateral Lumbar Fusion;  Surgeon: Maeola Harman, MD;  Location: [facility]UR[facility] OR[facility];  Service: Neurosurgery;  Laterality: Left;   BLADDER REPAIR     BREAST LUMPECTOMY Right    BUNIONECTOMY Bilateral    COLONOSCOPY     COLONOSCOPY WITH PROPOFOL N/A 12/01/2016   Procedure: COLONOSCOPY WITH PROPOFOL;  Surgeon: Malissa Hippo, MD;  Location: [facility] ENDO SUITE;  Service: Endoscopy;  Laterality: N/A;  7:30   ESOPHAGEAL DILATION N/A 12/01/2016   Procedure: ESOPHAGEAL DILATION;  Surgeon: Malissa Hippo, MD;  Location: [facility] ENDO SUITE;  Service: Endoscopy;  Laterality: N/A;   ESOPHAGOGASTRODUODENOSCOPY (EGD) WITH PROPOFOL N/A 12/01/2016   Procedure: ESOPHAGOGASTRODUODENOSCOPY (EGD) WITH PROPOFOL;  Surgeon: Malissa Hippo, MD;  Location: [facility] SU[facility];  Service: Endoscopy;  Laterality: N/A;   Hiatal hernia repair     Once in [place], 2x at [facility]   NASAL SINUS SURGERY     POLYPECTOMY  12/01/2016   Procedure: POLYPECTOMY;  Surgeon: Malissa Hippo, MD;  Location: [facility] ENDO SUITE;  Service: Endoscopy;;  cecal polypectomy via cold biopsy   POSTERIOR LUMBAR FUSION  11/09/2015   L5-S1 with Pedicle Screws L2-LUMBAR SACRAL ONE/NOTES 11/09/2015   TONSILLECTOMY     TOTAL KNEE ARTHROPLASTY Left 01/03/2022   Procedure: TOTAL KNEE ARTHROPLASTY;  Surgeon: Durene Romans, MD;  Location: [facility] ORS;  Service: Orthopedics;  Laterality: Left;   TOTAL KNEE ARTHROPLASTY Right 02/21/2022   Procedure: TOTAL KNEE ARTHROPLASTY;  Surgeon: Durene Romans, MD;  Location: [facility] ORS;  Service: Orthopedics;  Laterality: Right;    MEDICATIONS:  atorvastatin (LIPITOR) 20 MG tablet   Calcium Carb-Cholecalciferol (CALCIUM 600/VITAMIN D PO)   celecoxib (CELEBREX) 200 MG capsule   cetirizine (ZYRTEC) 10 MG tablet   denosumab (PROLIA) 60 MG/ML SOSY injection   diltiazem (CARDIZEM) 30 MG tablet   docusate sodium (COLACE) 100 MG capsule   FLUoxetine (PROZAC) 40 MG capsule   Fluticasone Furoate (ARNUITY ELLIPTA) 100 MCG/ACT AEPB    HYDROcodone-acetaminophen (NORCO) 10-325 MG tablet   HYDROcodone-acetaminophen (NORCO) 7.5-325 MG tablet   losartan-hydrochlorothiazide (HYZAAR) 50-12.5 MG tablet   methocarbamol (ROBAXIN) 500 MG tablet   montelukast (SINGULAIR) 10 MG tablet   Multiple Vitamins-Minerals (MULTIVITAMIN WITH MINERALS) tablet   omeprazole (PRILOSEC) 40 MG capsule   polyethylene glycol (MIRALAX / GLYCOLAX) 17 g packet   predniSONE (DELTASONE) 10 MG tablet   SUMAtriptan 6 MG/0.5ML SOAJ   traMADol (ULTRAM) 50 MG tablet   Vibegron (GEMTESA) 75 MG TABS   XARELTO 20 MG TABS tablet   No current facility-administered medications for this encounter.   Not currently on prednisone.   Shonna Chock, PA-C Surgical Short Stay/Anesthesiology [facility] Phone [phone] [facility] Phone [phone] 08/21/2023 7:13 PM

## 2023-08-22 DIAGNOSIS — H25813 Combined forms of age-related cataract, bilateral: Secondary | ICD-10-CM | POA: Diagnosis not present

## 2023-08-28 ENCOUNTER — Other Ambulatory Visit: Payer: Self-pay

## 2023-08-28 ENCOUNTER — Inpatient Hospital Stay (HOSPITAL_COMMUNITY): Payer: Medicare PPO

## 2023-08-28 ENCOUNTER — Encounter (HOSPITAL_COMMUNITY): Payer: Self-pay | Admitting: Neurological Surgery

## 2023-08-28 ENCOUNTER — Inpatient Hospital Stay (HOSPITAL_COMMUNITY): Payer: Medicare PPO | Admitting: Vascular Surgery

## 2023-08-28 ENCOUNTER — Inpatient Hospital Stay (HOSPITAL_COMMUNITY)
Admission: RE | Admit: 2023-08-28 | Discharge: 2023-09-02 | DRG: 428 | Disposition: A | Discharge status: Home Health | Payer: Medicare PPO | Attending: Neurological Surgery | Admitting: Neurological Surgery

## 2023-08-28 ENCOUNTER — Encounter (HOSPITAL_COMMUNITY)
Admission: RE | Disposition: A | Discharge status: Home Health | Payer: Self-pay | Source: Home / Self Care | Attending: Neurological Surgery

## 2023-08-28 DIAGNOSIS — Z888 Allergy status to other drugs, medicaments and biological substances status: Secondary | ICD-10-CM

## 2023-08-28 DIAGNOSIS — E785 Hyperlipidemia, unspecified: Secondary | ICD-10-CM | POA: Diagnosis not present

## 2023-08-28 DIAGNOSIS — M4804 Spinal stenosis, thoracic region: Principal | ICD-10-CM | POA: Diagnosis present

## 2023-08-28 DIAGNOSIS — Z885 Allergy status to narcotic agent status: Secondary | ICD-10-CM | POA: Diagnosis not present

## 2023-08-28 DIAGNOSIS — M47815 Spondylosis without myelopathy or radiculopathy, thoracolumbar region: Secondary | ICD-10-CM | POA: Diagnosis not present

## 2023-08-28 DIAGNOSIS — Z79899 Other long term (current) drug therapy: Secondary | ICD-10-CM | POA: Diagnosis not present

## 2023-08-28 DIAGNOSIS — I10 Essential (primary) hypertension: Secondary | ICD-10-CM | POA: Diagnosis not present

## 2023-08-28 DIAGNOSIS — K219 Gastro-esophageal reflux disease without esophagitis: Secondary | ICD-10-CM | POA: Diagnosis present

## 2023-08-28 DIAGNOSIS — M5116 Intervertebral disc disorders with radiculopathy, lumbar region: Secondary | ICD-10-CM | POA: Diagnosis present

## 2023-08-28 DIAGNOSIS — J45909 Unspecified asthma, uncomplicated: Secondary | ICD-10-CM | POA: Diagnosis present

## 2023-08-28 DIAGNOSIS — Z96653 Presence of artificial knee joint, bilateral: Secondary | ICD-10-CM | POA: Diagnosis present

## 2023-08-28 DIAGNOSIS — Z86711 Personal history of pulmonary embolism: Secondary | ICD-10-CM | POA: Diagnosis not present

## 2023-08-28 DIAGNOSIS — G43909 Migraine, unspecified, not intractable, without status migrainosus: Secondary | ICD-10-CM | POA: Diagnosis present

## 2023-08-28 DIAGNOSIS — M48062 Spinal stenosis, lumbar region with neurogenic claudication: Secondary | ICD-10-CM

## 2023-08-28 DIAGNOSIS — Z9841 Cataract extraction status, right eye: Secondary | ICD-10-CM | POA: Diagnosis not present

## 2023-08-28 DIAGNOSIS — Z853 Personal history of malignant neoplasm of breast: Secondary | ICD-10-CM | POA: Diagnosis not present

## 2023-08-28 DIAGNOSIS — M48061 Spinal stenosis, lumbar region without neurogenic claudication: Principal | ICD-10-CM | POA: Diagnosis present

## 2023-08-28 DIAGNOSIS — Z7901 Long term (current) use of anticoagulants: Secondary | ICD-10-CM

## 2023-08-28 DIAGNOSIS — M4316 Spondylolisthesis, lumbar region: Secondary | ICD-10-CM | POA: Diagnosis not present

## 2023-08-28 DIAGNOSIS — Z0189 Encounter for other specified special examinations: Secondary | ICD-10-CM | POA: Diagnosis not present

## 2023-08-28 LAB — GLUCOSE, CAPILLARY: Glucose-Capillary: 114 mg/dL — ABNORMAL HIGH (ref 70–99)

## 2023-08-28 SURGERY — POSTERIOR LUMBAR FUSION 1 LEVEL
Anesthesia: General | Site: Spine Lumbar

## 2023-08-28 MED ORDER — LIDOCAINE-EPINEPHRINE 1 %-1:100000 IJ SOLN
INTRAMUSCULAR | Status: AC
  Filled 2023-08-28: qty 1

## 2023-08-28 MED ORDER — THROMBIN 5000 UNITS EX SOLR: OROMUCOSAL | Status: DC | PRN

## 2023-08-28 MED ORDER — ACETAMINOPHEN 650 MG RE SUPP: 650.0000 mg | RECTAL | Status: DC | PRN

## 2023-08-28 MED ORDER — PROPOFOL 10 MG/ML IV BOLUS
INTRAVENOUS | Status: AC
  Filled 2023-08-28: qty 20

## 2023-08-28 MED ORDER — LIDOCAINE 2% (20 MG/ML) 5 ML SYRINGE
INTRAMUSCULAR | Status: AC
  Filled 2023-08-28: qty 5

## 2023-08-28 MED ORDER — MIRABEGRON ER 25 MG PO TB24
25.0000 mg | ORAL_TABLET | Freq: Every day | ORAL | Status: DC
  Administered 2023-08-29 – 2023-09-02 (×5): 25 mg via ORAL
  Filled 2023-08-28 (×5): qty 1

## 2023-08-28 MED ORDER — BUPIVACAINE LIPOSOME 1.3 % IJ SUSP
INTRAMUSCULAR | Status: DC | PRN
  Administered 2023-08-28: 20 mL

## 2023-08-28 MED ORDER — OXYCODONE HCL 5 MG PO TABS: 10.0000 mg | ORAL_TABLET | ORAL | Status: DC | PRN

## 2023-08-28 MED ORDER — VANCOMYCIN HCL 1000 MG IV SOLR
INTRAVENOUS | Status: AC
  Filled 2023-08-28: qty 20

## 2023-08-28 MED ORDER — LOSARTAN POTASSIUM 50 MG PO TABS
50.0000 mg | ORAL_TABLET | Freq: Every day | ORAL | Status: DC
  Administered 2023-08-29 – 2023-09-02 (×3): 50 mg via ORAL
  Filled 2023-08-28 (×4): qty 1

## 2023-08-28 MED ORDER — ORAL CARE MOUTH RINSE: 15.0000 mL | Freq: Once | OROMUCOSAL | Status: AC

## 2023-08-28 MED ORDER — SUMATRIPTAN SUCCINATE 6 MG/0.5ML ~~LOC~~ SOLN: 6.0000 mg | Freq: Every day | SUBCUTANEOUS | Status: DC | PRN

## 2023-08-28 MED ORDER — FENTANYL CITRATE (PF) 250 MCG/5ML IJ SOLN
INTRAMUSCULAR | Status: AC
  Filled 2023-08-28: qty 5

## 2023-08-28 MED ORDER — DROPERIDOL 2.5 MG/ML IJ SOLN
INTRAMUSCULAR | Status: AC
  Filled 2023-08-28: qty 2

## 2023-08-28 MED ORDER — PROPOFOL 10 MG/ML IV BOLUS
INTRAVENOUS | Status: DC | PRN
  Administered 2023-08-28: 160 mg via INTRAVENOUS

## 2023-08-28 MED ORDER — ONDANSETRON HCL 4 MG/2ML IJ SOLN
4.0000 mg | Freq: Four times a day (QID) | INTRAMUSCULAR | Status: DC | PRN
  Administered 2023-09-01: 4 mg via INTRAVENOUS
  Filled 2023-08-28: qty 2

## 2023-08-28 MED ORDER — ALBUMIN HUMAN 5 % IV SOLN
INTRAVENOUS | Status: DC | PRN
Start: 2023-08-28 — End: 2023-08-28

## 2023-08-28 MED ORDER — THROMBIN 20000 UNITS EX SOLR
CUTANEOUS | Status: AC
  Filled 2023-08-28: qty 20000

## 2023-08-28 MED ORDER — CHLORHEXIDINE GLUCONATE CLOTH 2 % EX PADS: 6.0000 | MEDICATED_PAD | Freq: Once | CUTANEOUS | Status: DC

## 2023-08-28 MED ORDER — SODIUM CHLORIDE 0.9 % IV SOLN: 250.0000 mL | INTRAVENOUS | Status: DC

## 2023-08-28 MED ORDER — LACTATED RINGERS IV SOLN: INTRAVENOUS | Status: DC

## 2023-08-28 MED ORDER — OXYCODONE HCL 5 MG PO TABS: 5.0000 mg | ORAL_TABLET | ORAL | Status: DC | PRN

## 2023-08-28 MED ORDER — MONTELUKAST SODIUM 10 MG PO TABS
10.0000 mg | ORAL_TABLET | Freq: Every day | ORAL | Status: DC
  Administered 2023-08-29 – 2023-09-01 (×4): 10 mg via ORAL
  Filled 2023-08-28 (×4): qty 1

## 2023-08-28 MED ORDER — SODIUM CHLORIDE 0.9% FLUSH
3.0000 mL | Freq: Two times a day (BID) | INTRAVENOUS | Status: DC
  Administered 2023-08-28 – 2023-09-02 (×10): 3 mL via INTRAVENOUS

## 2023-08-28 MED ORDER — LIDOCAINE 2% (20 MG/ML) 5 ML SYRINGE
INTRAMUSCULAR | Status: DC | PRN
  Administered 2023-08-28: 100 mg via INTRAVENOUS

## 2023-08-28 MED ORDER — THROMBIN 5000 UNITS EX SOLR
CUTANEOUS | Status: AC
  Filled 2023-08-28: qty 5000

## 2023-08-28 MED ORDER — METHOCARBAMOL 1000 MG/10ML IJ SOLN: 500.0000 mg | Freq: Four times a day (QID) | INTRAVENOUS | Status: DC | PRN

## 2023-08-28 MED ORDER — VANCOMYCIN HCL 1 G IV SOLR
INTRAVENOUS | Status: DC | PRN
Start: 2023-08-28 — End: 2023-08-28
  Administered 2023-08-28: 1000 mg via TOPICAL

## 2023-08-28 MED ORDER — ONDANSETRON HCL 4 MG/2ML IJ SOLN
INTRAMUSCULAR | Status: DC | PRN
  Administered 2023-08-28: 4 mg via INTRAVENOUS

## 2023-08-28 MED ORDER — HYDROCHLOROTHIAZIDE 12.5 MG PO TABS
12.5000 mg | ORAL_TABLET | Freq: Every day | ORAL | Status: DC
  Administered 2023-08-29 – 2023-09-02 (×2): 12.5 mg via ORAL
  Filled 2023-08-28 (×4): qty 1

## 2023-08-28 MED ORDER — ACETAMINOPHEN 325 MG PO TABS
650.0000 mg | ORAL_TABLET | ORAL | Status: DC | PRN
  Administered 2023-08-30 – 2023-09-01 (×2): 650 mg via ORAL
  Filled 2023-08-28 (×2): qty 2

## 2023-08-28 MED ORDER — BACITRACIN ZINC 500 UNIT/GM EX OINT
TOPICAL_OINTMENT | CUTANEOUS | Status: AC
  Filled 2023-08-28: qty 28.35

## 2023-08-28 MED ORDER — ROCURONIUM BROMIDE 10 MG/ML (PF) SYRINGE
PREFILLED_SYRINGE | INTRAVENOUS | Status: DC | PRN
  Administered 2023-08-28: 30 mg via INTRAVENOUS
  Administered 2023-08-28: 70 mg via INTRAVENOUS
  Administered 2023-08-28: 20 mg via INTRAVENOUS

## 2023-08-28 MED ORDER — ROCURONIUM BROMIDE 10 MG/ML (PF) SYRINGE
PREFILLED_SYRINGE | INTRAVENOUS | Status: AC
  Filled 2023-08-28: qty 10

## 2023-08-28 MED ORDER — LOSARTAN POTASSIUM-HCTZ 50-12.5 MG PO TABS: 1.0000 | ORAL_TABLET | Freq: Every day | ORAL | Status: DC

## 2023-08-28 MED ORDER — BUPIVACAINE-EPINEPHRINE (PF) 0.5% -1:200000 IJ SOLN
INTRAMUSCULAR | Status: AC
  Filled 2023-08-28: qty 30

## 2023-08-28 MED ORDER — ATORVASTATIN CALCIUM 10 MG PO TABS
20.0000 mg | ORAL_TABLET | Freq: Every day | ORAL | Status: DC
  Administered 2023-08-29 – 2023-09-02 (×5): 20 mg via ORAL
  Filled 2023-08-28 (×5): qty 2

## 2023-08-28 MED ORDER — PHENYLEPHRINE 80 MCG/ML (10ML) SYRINGE FOR IV PUSH (FOR BLOOD PRESSURE SUPPORT)
PREFILLED_SYRINGE | INTRAVENOUS | Status: DC | PRN
  Administered 2023-08-28: 160 ug via INTRAVENOUS
  Administered 2023-08-28: 80 ug via INTRAVENOUS
  Administered 2023-08-28 (×2): 160 ug via INTRAVENOUS

## 2023-08-28 MED ORDER — POLYETHYLENE GLYCOL 3350 17 G PO PACK: 17.0000 g | PACK | Freq: Every day | ORAL | Status: DC | PRN

## 2023-08-28 MED ORDER — BUPIVACAINE-EPINEPHRINE 0.5% -1:200000 IJ SOLN
INTRAMUSCULAR | Status: DC | PRN
  Administered 2023-08-28: 5 mL

## 2023-08-28 MED ORDER — ONDANSETRON HCL 4 MG/2ML IJ SOLN
INTRAMUSCULAR | Status: AC
  Filled 2023-08-28: qty 2

## 2023-08-28 MED ORDER — DILTIAZEM HCL 30 MG PO TABS
30.0000 mg | ORAL_TABLET | Freq: Two times a day (BID) | ORAL | Status: DC
  Administered 2023-08-28 – 2023-09-02 (×9): 30 mg via ORAL
  Filled 2023-08-28 (×11): qty 1

## 2023-08-28 MED ORDER — SUGAMMADEX SODIUM 200 MG/2ML IV SOLN
INTRAVENOUS | Status: DC | PRN
  Administered 2023-08-28: 200 mg via INTRAVENOUS

## 2023-08-28 MED ORDER — HYDROCODONE-ACETAMINOPHEN 5-325 MG PO TABS
1.0000 | ORAL_TABLET | ORAL | Status: DC | PRN
  Administered 2023-09-01: 1 via ORAL
  Filled 2023-08-28: qty 1

## 2023-08-28 MED ORDER — SODIUM CHLORIDE 0.9% FLUSH: 3.0000 mL | INTRAVENOUS | Status: DC | PRN

## 2023-08-28 MED ORDER — PHENYLEPHRINE 80 MCG/ML (10ML) SYRINGE FOR IV PUSH (FOR BLOOD PRESSURE SUPPORT)
PREFILLED_SYRINGE | INTRAVENOUS | Status: AC
  Filled 2023-08-28: qty 10

## 2023-08-28 MED ORDER — PHENYLEPHRINE HCL-NACL 20-0.9 MG/250ML-% IV SOLN
INTRAVENOUS | Status: DC | PRN
  Administered 2023-08-28: 50 ug/min via INTRAVENOUS

## 2023-08-28 MED ORDER — ONDANSETRON HCL 4 MG PO TABS
4.0000 mg | ORAL_TABLET | Freq: Four times a day (QID) | ORAL | Status: DC | PRN
  Administered 2023-08-30: 4 mg via ORAL
  Filled 2023-08-28: qty 1

## 2023-08-28 MED ORDER — BUPIVACAINE LIPOSOME 1.3 % IJ SUSP
INTRAMUSCULAR | Status: AC
  Filled 2023-08-28: qty 20

## 2023-08-28 MED ORDER — THROMBIN 20000 UNITS EX SOLR: CUTANEOUS | Status: DC | PRN

## 2023-08-28 MED ORDER — INSULIN ASPART 100 UNIT/ML IJ SOLN: 0.0000 [IU] | INTRAMUSCULAR | Status: DC | PRN

## 2023-08-28 MED ORDER — ACETAMINOPHEN 500 MG PO TABS: 1000.0000 mg | ORAL_TABLET | Freq: Once | ORAL | Status: DC

## 2023-08-28 MED ORDER — FLEET ENEMA RE ENEM: 1.0000 | ENEMA | Freq: Once | RECTAL | Status: DC | PRN

## 2023-08-28 MED ORDER — MENTHOL 3 MG MT LOZG: 1.0000 | LOZENGE | OROMUCOSAL | Status: DC | PRN

## 2023-08-28 MED ORDER — DROPERIDOL 2.5 MG/ML IJ SOLN
0.6250 mg | Freq: Once | INTRAMUSCULAR | Status: AC | PRN
  Administered 2023-08-28: 0.625 mg via INTRAVENOUS

## 2023-08-28 MED ORDER — METHOCARBAMOL 500 MG PO TABS
ORAL_TABLET | ORAL | Status: AC
  Filled 2023-08-28: qty 1

## 2023-08-28 MED ORDER — CEFAZOLIN SODIUM-DEXTROSE 2-4 GM/100ML-% IV SOLN
2.0000 g | INTRAVENOUS | Status: AC
  Administered 2023-08-28 (×2): 2 g via INTRAVENOUS
  Filled 2023-08-28: qty 100

## 2023-08-28 MED ORDER — CEFAZOLIN SODIUM-DEXTROSE 2-4 GM/100ML-% IV SOLN
2.0000 g | Freq: Three times a day (TID) | INTRAVENOUS | Status: AC
  Administered 2023-08-28 – 2023-08-29 (×2): 2 g via INTRAVENOUS
  Filled 2023-08-28 (×2): qty 100

## 2023-08-28 MED ORDER — PHENOL 1.4 % MT LIQD: 1.0000 | OROMUCOSAL | Status: DC | PRN

## 2023-08-28 MED ORDER — LIDOCAINE-EPINEPHRINE 1 %-1:100000 IJ SOLN
INTRAMUSCULAR | Status: DC | PRN
  Administered 2023-08-28: 5 mL

## 2023-08-28 MED ORDER — DEXAMETHASONE SODIUM PHOSPHATE 10 MG/ML IJ SOLN
INTRAMUSCULAR | Status: DC | PRN
  Administered 2023-08-28: 10 mg via INTRAVENOUS

## 2023-08-28 MED ORDER — FENTANYL CITRATE (PF) 100 MCG/2ML IJ SOLN
25.0000 ug | INTRAMUSCULAR | Status: DC | PRN
  Administered 2023-08-28: 25 ug via INTRAVENOUS
  Administered 2023-08-28: 50 ug via INTRAVENOUS

## 2023-08-28 MED ORDER — MIDAZOLAM HCL 2 MG/2ML IJ SOLN
INTRAMUSCULAR | Status: DC | PRN
  Administered 2023-08-28: 2 mg via INTRAVENOUS

## 2023-08-28 MED ORDER — FENTANYL CITRATE (PF) 250 MCG/5ML IJ SOLN
INTRAMUSCULAR | Status: DC | PRN
  Administered 2023-08-28: 50 ug via INTRAVENOUS
  Administered 2023-08-28 (×2): 100 ug via INTRAVENOUS

## 2023-08-28 MED ORDER — FLUOXETINE HCL 20 MG PO CAPS
40.0000 mg | ORAL_CAPSULE | Freq: Every day | ORAL | Status: DC
  Administered 2023-08-29 – 2023-09-02 (×5): 40 mg via ORAL
  Filled 2023-08-28 (×5): qty 2

## 2023-08-28 MED ORDER — CHLORHEXIDINE GLUCONATE 0.12 % MT SOLN
15.0000 mL | Freq: Once | OROMUCOSAL | Status: AC
  Administered 2023-08-28: 15 mL via OROMUCOSAL
  Filled 2023-08-28: qty 15

## 2023-08-28 MED ORDER — SUMATRIPTAN SUCCINATE 6 MG/0.5ML ~~LOC~~ SOAJ: 6.0000 mg | Freq: Every day | SUBCUTANEOUS | Status: DC | PRN

## 2023-08-28 MED ORDER — 0.9 % SODIUM CHLORIDE (POUR BTL) OPTIME
TOPICAL | Status: DC | PRN
  Administered 2023-08-28: 1000 mL

## 2023-08-28 MED ORDER — METHOCARBAMOL 500 MG PO TABS
500.0000 mg | ORAL_TABLET | Freq: Four times a day (QID) | ORAL | Status: DC | PRN
  Administered 2023-08-30 – 2023-09-02 (×4): 500 mg via ORAL
  Filled 2023-08-28 (×4): qty 1

## 2023-08-28 MED ORDER — MIDAZOLAM HCL 2 MG/2ML IJ SOLN
INTRAMUSCULAR | Status: AC
  Filled 2023-08-28: qty 2

## 2023-08-28 MED ORDER — HYDROCODONE-ACETAMINOPHEN 10-325 MG PO TABS
1.0000 | ORAL_TABLET | ORAL | Status: DC | PRN
  Administered 2023-08-28 – 2023-09-02 (×21): 1 via ORAL
  Filled 2023-08-28 (×22): qty 1

## 2023-08-28 MED ORDER — KETOROLAC TROMETHAMINE 15 MG/ML IJ SOLN
7.5000 mg | Freq: Four times a day (QID) | INTRAMUSCULAR | Status: AC
  Administered 2023-08-28 – 2023-08-29 (×4): 7.5 mg via INTRAVENOUS
  Filled 2023-08-28 (×4): qty 1

## 2023-08-28 MED ORDER — DEXAMETHASONE SODIUM PHOSPHATE 10 MG/ML IJ SOLN
INTRAMUSCULAR | Status: AC
  Filled 2023-08-28: qty 1

## 2023-08-28 MED ORDER — FENTANYL CITRATE (PF) 100 MCG/2ML IJ SOLN
INTRAMUSCULAR | Status: AC
  Filled 2023-08-28: qty 2

## 2023-08-28 MED ORDER — MORPHINE SULFATE (PF) 2 MG/ML IV SOLN
2.0000 mg | INTRAVENOUS | Status: DC | PRN
  Administered 2023-08-28 (×2): 2 mg via INTRAVENOUS
  Filled 2023-08-28 (×2): qty 1

## 2023-08-28 MED ORDER — ACETAMINOPHEN 325 MG PO TABS
650.0000 mg | ORAL_TABLET | Freq: Once | ORAL | Status: AC
  Administered 2023-08-28: 650 mg via ORAL
  Filled 2023-08-28: qty 2

## 2023-08-28 MED ORDER — DOCUSATE SODIUM 100 MG PO CAPS
100.0000 mg | ORAL_CAPSULE | Freq: Two times a day (BID) | ORAL | Status: DC
  Administered 2023-08-30 – 2023-09-02 (×5): 100 mg via ORAL
  Filled 2023-08-28 (×8): qty 1

## 2023-08-28 SURGICAL SUPPLY — 80 items
ADH SKN CLS APL DERMABOND .7 (GAUZE/BANDAGES/DRESSINGS) ×2
BAG COUNTER SPONGE SURGICOUNT (BAG) ×2 IMPLANT
BAG SPNG CNTER NS LX DISP (BAG) ×2
BASKET BONE COLLECTION (BASKET) IMPLANT
BLADE BONE MILL MEDIUM (MISCELLANEOUS) IMPLANT
BUR 14 MATCH 3 (BUR) IMPLANT
BUR CARBIDE MATCH 3.0 (BURR) ×2 IMPLANT
BUR MR8 14 BALL 5 (BUR) IMPLANT
BURR 14 MATCH 3 (BUR) ×2
BURR MR8 14 BALL 5 (BUR)
CAGE INTERBODY PL SHT 7X22.5 (Plate) IMPLANT
CNTNR URN SCR LID CUP LEK RST (MISCELLANEOUS) ×2 IMPLANT
CONN SPINAL HOR 5/6 DT (Connector) ×4 IMPLANT
CONNECTOR SPINAL HOR 5/6 DT (Connector) IMPLANT
CONT SPEC 4OZ STRL OR WHT (MISCELLANEOUS) ×2
COVER BACK TABLE 60X90IN (DRAPES) ×2 IMPLANT
COVERAGE SUPPORT O-ARM STEALTH (MISCELLANEOUS) ×2 IMPLANT
DERMABOND ADVANCED .7 DNX12 (GAUZE/BANDAGES/DRESSINGS) ×2 IMPLANT
DRAIN JACKSON RD 7FR 3/32 (WOUND CARE) IMPLANT
DRAPE C-ARM 42X72 X-RAY (DRAPES) ×2 IMPLANT
DRAPE LAPAROTOMY 100X72X124 (DRAPES) ×2 IMPLANT
DRAPE LEICA MICROSCOPE GLASS (DRAPES) IMPLANT
DRAPE MICROSCOPE SLANT 54X150 (MISCELLANEOUS) IMPLANT
DRAPE SHEET LG 3/4 BI-LAMINATE (DRAPES) ×8 IMPLANT
DRAPE SURG 17X23 STRL (DRAPES) ×2 IMPLANT
DRSG OPSITE 4X5.5 SM (GAUZE/BANDAGES/DRESSINGS) ×2 IMPLANT
DRSG OPSITE POSTOP 4X10 (GAUZE/BANDAGES/DRESSINGS) IMPLANT
DURAPREP 26ML APPLICATOR (WOUND CARE) ×2 IMPLANT
ELECT BLADE INSULATED 4IN (ELECTROSURGICAL) ×2
ELECT COATED BLADE 2.86 ST (ELECTRODE) ×2 IMPLANT
ELECT REM PT RETURN 9FT ADLT (ELECTROSURGICAL) ×2
ELECTRODE BLADE INSULATED 4IN (ELECTROSURGICAL) ×2 IMPLANT
ELECTRODE REM PT RTRN 9FT ADLT (ELECTROSURGICAL) ×2 IMPLANT
FEE COVERAGE SUPPORT O-ARM (MISCELLANEOUS) ×2 IMPLANT
GAUZE 4X4 16PLY ~~LOC~~+RFID DBL (SPONGE) IMPLANT
GAUZE PAD ABD 8X10 STRL (GAUZE/BANDAGES/DRESSINGS) ×2 IMPLANT
GLOVE BIO SURGEON STRL SZ7 (GLOVE) ×2 IMPLANT
GLOVE BIOGEL PI IND STRL 7.5 (GLOVE) ×2 IMPLANT
GLOVE BIOGEL PI IND STRL 8 (GLOVE) ×4 IMPLANT
GLOVE ECLIPSE 8.0 STRL XLNG CF (GLOVE) ×4 IMPLANT
GOWN STRL REUS W/ TWL LRG LVL3 (GOWN DISPOSABLE) IMPLANT
GOWN STRL REUS W/ TWL XL LVL3 (GOWN DISPOSABLE) ×4 IMPLANT
GOWN STRL REUS W/TWL 2XL LVL3 (GOWN DISPOSABLE) IMPLANT
GOWN STRL REUS W/TWL LRG LVL3 (GOWN DISPOSABLE) ×4
GOWN STRL REUS W/TWL XL LVL3 (GOWN DISPOSABLE) ×8
GRAFT BN 5X1XSPNE CVD POST DBM (Bone Implant) IMPLANT
GRAFT BONE MAGNIFUSE 1X5CM (Bone Implant) ×2 IMPLANT
KIT BASIN OR (CUSTOM PROCEDURE TRAY) ×2 IMPLANT
KIT INFUSE XX SMALL 0.7CC (Orthopedic Implant) IMPLANT
KIT POSITION SURG JACKSON T1 (MISCELLANEOUS) ×2 IMPLANT
KIT TURNOVER KIT B (KITS) ×2 IMPLANT
MARKER SKIN DUAL TIP RULER LAB (MISCELLANEOUS) ×2 IMPLANT
MARKER SPHERE PSV REFLC NDI (MISCELLANEOUS) ×10 IMPLANT
NDL HYPO 21X1.5 ECLIPSE (NEEDLE) ×2 IMPLANT
NDL HYPO 25X1 1.5 SAFETY (NEEDLE) ×2 IMPLANT
NEEDLE HYPO 21X1.5 ECLIPSE (NEEDLE) ×2 IMPLANT
NEEDLE HYPO 25X1 1.5 SAFETY (NEEDLE) ×2 IMPLANT
NS IRRIG 1000ML POUR BTL (IV SOLUTION) ×2 IMPLANT
PACK LAMINECTOMY NEURO (CUSTOM PROCEDURE TRAY) ×2 IMPLANT
PAD ARMBOARD 7.5X6 YLW CONV (MISCELLANEOUS) ×6 IMPLANT
PATTIES SURGICAL .5 X.5 (GAUZE/BANDAGES/DRESSINGS) IMPLANT
PATTIES SURGICAL .5 X1 (DISPOSABLE) IMPLANT
PATTIES SURGICAL 1X1 (DISPOSABLE) IMPLANT
PUTTY GRAFTON DBF 6CC W/DELIVE (Putty) IMPLANT
ROD Z OFFSET 5.5X11.4 (Rod) IMPLANT
SCREW 6.5X40 (Screw) IMPLANT
SCREW SET SOLERA (Screw) ×24 IMPLANT
SCREW SET SOLERA TI5.5 (Screw) IMPLANT
SPONGE SURGIFOAM ABS GEL 100 (HEMOSTASIS) ×2 IMPLANT
SPONGE T-LAP 4X18 ~~LOC~~+RFID (SPONGE) IMPLANT
STAPLER VISISTAT 35W (STAPLE) ×2 IMPLANT
SUT VIC AB 0 CT1 18XCR BRD8 (SUTURE) ×2 IMPLANT
SUT VIC AB 0 CT1 8-18 (SUTURE) ×4
SUT VIC AB 2-0 CP2 18 (SUTURE) ×2 IMPLANT
SUT VIC AB 3-0 SH 8-18 (SUTURE) ×2 IMPLANT
SYR 20ML LL LF (SYRINGE) ×2 IMPLANT
TOWEL GREEN STERILE (TOWEL DISPOSABLE) IMPLANT
TOWEL GREEN STERILE FF (TOWEL DISPOSABLE) IMPLANT
TRAY FOLEY MTR SLVR 16FR STAT (SET/KITS/TRAYS/PACK) ×2 IMPLANT
WATER STERILE IRR 1000ML POUR (IV SOLUTION) ×2 IMPLANT

## 2023-08-28 NOTE — Op Note (Addendum)
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, retrolisthesis, stenosis with neurogenic claudication; T12-L1 spondylosis  POSTOP DIAGNOSIS: Same  PROCEDURE: Open thoracicolumbar instrumentation, posterior lateral arthrodesis T10, T11, T12, L1, L2 Bilateral T10, T11, T12, L1 pedicle screw instrumentation Medtronic solera T10: 6.5 x 40 mm bilaterally, T11: 6.5 x 40 mm bilaterally, T12: 6.5 x 40 mm bilaterally, L1 6.5 x 40 mm bilaterally Right L1-2 decompression, transforaminal lumbar interbody fusion Placement of anterior biomechanical device; Medtronic catalyft 7 x 23 mm titanium interbody Intraoperative use of autograft, same incision Intraoperative use of allograft Intraoperative use of BMP, Xxs Intraoperative use of stereotactic navigation, Medtronic O-arm/Stealth (utilized for pedicle screw planning trajectory and placement) Intraoperative use of microscope for microdissection Exploration of fusion L2-S1  SURGEON: Dr. Kendell Bane C. Kaelum Kissick, DO  ASSISTANT: Patrici Ranks, PA  ANESTHESIA: General Endotracheal  EBL: 500 cc  SPECIMENS: None  DRAINS: Medium Hemovac, epidural space  COMPLICATIONS: None  CONDITION: Hemodynamically stable  HISTORY: LAURELL SINGLETARY is a 77 y.o. female with a history of an L2-S1 fusion, that had progressively worsening low back pain and bilateral lower extremity neurogenic claudication.  Workup revealed severe degenerative adjacent segment stenosis with retrolisthesis of L1 on L2.  There is also degenerative disc disease at T12-L1 therefore she underwent conservative treatment including pain control and epidural injections without longstanding relief.  Given her failure of conservative measures, I recommended surgical intervention in the form of a T10-L2 extension fusion, L1-L2 decompression, TLIF.  We discussed all risks, benefits and expected outcomes as well as  alternatives to treatment.  Informed consent was obtained and witnessed.  PROCEDURE IN DETAIL: The patient was brought to the operating room. After induction of general anesthesia, the patient was positioned on the operative table in the prone position. All pressure points were meticulously padded. Skin incision was then marked out and prepped and draped in the usual sterile fashion. Physician driven timeout was performed.  Local anesthetic was injected into the planned incision.  Using 10 blade, incision was made sharply over the T10-L3 spinous processes.  Using Bovie electrocautery, midline dissection was performed out of the dorsal fascia.  Using Bovie electrocautery, subperiosteal dissection was performed bilaterally to expose the T10 lamina transverse processes, T11 lamina transverse processes, facets and at T12, L1, L2.  The prior hardware at L2-3 was exposed bilaterally.  Utilizing the spinous process clamp which was placed on L3, intraoperative CT was obtained.  Using neuronavigation, the accuracy of the intraoperative CT was verified with bony landmarks.  Using the high-speed drill, the T10 pedicles were accessed bilaterally.  Using a 5.5 mm tap tapped and the trajectory using neuronavigation the T10 pedicles.  Using ball-tipped probe there were bony borders in all directions and a bony floor.  The above listed pedicle screws were placed bilaterally with appropriate bony purchase.  This was repeated at T11, T12, L1.  Fluoroscopy confirmed the appropriate placement.  The microscope was then sterilely draped and brought into the field.  Using Leksell, the L1 and L2 spinous processes were removed.  Using a high-speed drill, bilateral L1 and L2 laminectomies were performed down to the ligamentum flavum.  A complete right facetectomy was performed down to the ligamentum flavum.  Autograft was saved for later use.  Using micro curettes, the ligamentum flavum was gently dissected from the epidural space  and the bilateral ligamentum flavum at L1-2 was removed completely with Kerrison rongeurs decompressing the bilateral lateral  recesses.  The ligamentum flavum on the right was then carried and removed laterally with Kerrison rongeurs decompressing the right foramina.  The exiting nerve root was identified and protected.  The epidural space was gently dissected with micro curettes.  Camden's triangle was identified, epidural hemostasis was achieved with bipolar forceps.  Annulotomy was performed with an 11 blade.  Annulotomy was then widened with Kerrison rongeurs.  Using lamina distractor on the contralateral side, expanded the L1-2 interspace in order to access with disc space shavers.  Radical discectomy was performed with a series of curettes and pituitary rongeurs.  There was subchondral bleeding bone along both endplates.  I then placed a BMP and autograft into the interspace, selected the above interbody and placed under lateral fluoroscopy.  Imaging confirmed appropriate placement.  I then backfilled the cage with further autograft and allograft.  Epidural hemostasis was achieved with Surgifoam.  Using ball-tipped probe, I followed the exiting and traversing nerve root and the common dural tube and they appeared decompressed and pulsatile.  I then decorticated with a high-speed drill the T10-11, T11-12, T12-L1 and left L1-2 remaining facet joint and transverse processes.  Appropriate sized rods were measured contoured and cut.  W connectors were placed bilaterally between the L2 and L3 prior hardware.  I then placed the Z rods and setscrews were placed and final tightened to the manufacturer's recommendation. Autograft and allograft was placed in the lateral gutters at T10-L2 bilaterally. The wound was copiously irrigated and noted to be excellently hemostatic.  A Hemovac was placed in the epidural space and tunneled laterally.  Soft tissue hemostasis was achieved with monopolar cautery.  Long-acting  anesthetic was placed in the soft tissues.  The wound was then closed in layers, muscle and fascia with 0 Vicryl suture.  Dermis was closed with 2-0 and 3-0 Vicryl suture.  Skin was closed with skin glue.  Sterile dressing applied.  At the end of the case all sponge, needle, and instrument counts were correct. The patient was then transferred to the stretcher, extubated, and taken to the post-anesthesia care unit in stable hemodynamic condition.

## 2023-08-28 NOTE — Anesthesia Postprocedure Evaluation (Signed)
Anesthesia Post Note  Patient: Bonnie Weeks  Procedure(s) Performed: OPEN LUMBAR ONE-TWO DECOMPRESSION AND TRANSFORAMINAL LUMBAR INTERBODY FUSION WITH EXTENSION OF POSTEROLATERAL FUSION THORACIC TEN-LUMBAR TWO (Spine Lumbar) Application of O-Arm     Patient location during evaluation: PACU Anesthesia Type: General Level of consciousness: awake and alert Pain management: pain level controlled Vital Signs Assessment: post-procedure vital signs reviewed and stable Respiratory status: spontaneous breathing, nonlabored ventilation and respiratory function stable Cardiovascular status: blood pressure returned to baseline Postop Assessment: no apparent nausea or vomiting Anesthetic complications: no   No notable events documented.  Last Vitals:  Vitals:   08/28/23 1645 08/28/23 1700  BP: (!) 145/69 (!) 143/65  Pulse: 68 62  Resp: 15 13  Temp:    SpO2: 95% 95%    Last Pain:  Vitals:   08/28/23 1602  TempSrc:   PainSc: Asleep   Pain Goal: Patients Stated Pain Goal: 4 (08/28/23 0925)  LLE Motor Response: Responds to commands (08/28/23 1700) LLE Sensation: Full sensation (08/28/23 1700) RLE Motor Response: Responds to commands (08/28/23 1700) RLE Sensation: Full sensation (08/28/23 1700)        Shanda Howells

## 2023-08-28 NOTE — Anesthesia Procedure Notes (Signed)
Procedure Name: Intubation Date/Time: 08/28/2023 10:39 AM  Performed by: Georgianne Fick D, CRNAPre-anesthesia Checklist: Patient identified, Emergency Drugs available, Suction available and Patient being monitored Patient Re-evaluated:Patient Re-evaluated prior to induction Oxygen Delivery Method: Circle System Utilized Preoxygenation: Pre-oxygenation with 100% oxygen Induction Type: IV induction Ventilation: Mask ventilation without difficulty Laryngoscope Size: Miller and 2 Grade View: Grade I Tube type: Oral Tube size: 7.0 mm Number of attempts: 1 Airway Equipment and Method: Stylet and Oral airway Placement Confirmation: ETT inserted through vocal cords under direct vision, positive ETCO2 and breath sounds checked- equal and bilateral Secured at: 22 cm Tube secured with: Tape Dental Injury: Teeth and Oropharynx as per pre-operative assessment

## 2023-08-28 NOTE — Anesthesia Preprocedure Evaluation (Addendum)
Anesthesia Evaluation  Patient identified by MRN, date of birth, ID band Patient awake    Reviewed: Allergy & Precautions, NPO status , Patient's Chart, lab work & pertinent test results  History of Anesthesia Complications Negative for: history of anesthetic complications  Airway Mallampati: III  TM Distance: >3 FB Neck ROM: Full  Mouth opening: Limited Mouth Opening  Dental no notable dental hx.    Pulmonary asthma , PE (on Eliquis)   Pulmonary exam normal        Cardiovascular hypertension, Pt. on medications Normal cardiovascular exam  Mild AS   Neuro/Psych  Headaches   Depression       GI/Hepatic Neg liver ROS, hiatal hernia,GERD  Medicated,,  Endo/Other  negative endocrine ROS    Renal/GU negative Renal ROS     Musculoskeletal  (+) Arthritis ,    Abdominal   Peds  Hematology negative hematology ROS (+)   Anesthesia Other Findings SPONDYLOLISTHESIS, LUMBAR REGION  Reproductive/Obstetrics                              Anesthesia Physical Anesthesia Plan  ASA: 2  Anesthesia Plan: General   Post-op Pain Management: Tylenol PO (pre-op)*, Ketamine IV* and Precedex   Induction: Intravenous  PONV Risk Score and Plan: 3 and Treatment may vary due to age or medical condition, Ondansetron and Dexamethasone  Airway Management Planned: Oral ETT and Video Laryngoscope Planned  Additional Equipment: None  Intra-op Plan:   Post-operative Plan: Extubation in OR  Informed Consent: I have reviewed the patients History and Physical, chart, labs and discussed the procedure including the risks, benefits and alternatives for the proposed anesthesia with the patient or authorized representative who has indicated his/her understanding and acceptance.     Dental advisory given  Plan Discussed with: CRNA  Anesthesia Plan Comments:         Anesthesia Quick Evaluation

## 2023-08-28 NOTE — H&P (Signed)
Providing Compassionate, Quality Care - Together  NEUROSURGERY HISTORY & PHYSICAL   Bonnie Weeks is an 77 y.o. female.   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  She had undergone multiple conservative measures without longstanding benefit including injections.  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  Given her continued difficulties with walking and bilateral lower extremity pain, I recommended surgical intervention in the form of an L1-2 decompression, TLIF, extension of fusion to T10 with connection to L2, posterior lateral fusion T10-L2.  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; on lifelong anticoagulation    Past Surgical History:  Procedure Laterality Date   ANTERIOR CERVICAL DECOMP/DISCECTOMY FUSION  11/2014   "[place]"   ANTERIOR LAT LUMBAR FUSION Left 11/09/2015   Procedure: Lumbar two-three,lumbar three-four.lumbar four-five Anterior Lateral Lumbar Fusion;  Surgeon: Maeola Harman, MD;  Location: [facility];  Service: Neurosurgery;  Laterality: Left;   BLADDER REPAIR     BREAST LUMPECTOMY Right    BUNIONECTOMY Bilateral    COLONOSCOPY     COLONOSCOPY WITH PROPOFOL N/A 12/01/2016   Procedure: COLONOSCOPY WITH PROPOFOL;  Surgeon: Malissa Hippo, MD;  Location: [facility];  Service: Endoscopy;  Laterality: N/A;  7:30   ESOPHAGEAL DILATION N/A 12/01/2016   Procedure: ESOPHAGEAL DILATION;  Surgeon: Malissa Hippo, MD;  Location: [facility];  Service: Endoscopy;  Laterality: N/A;   ESOPHAGOGASTRODUODENOSCOPY (EGD) WITH PROPOFOL N/A 12/01/2016   Procedure: ESOPHAGOGASTRODUODENOSCOPY (EGD) WITH PROPOFOL;  Surgeon: Malissa Hippo, MD;  Location: [facility];  Service: Endoscopy;  Laterality: N/A;   Hiatal hernia repair     Once in [place], 2x at [facility]   NASAL SINUS SURGERY     POLYPECTOMY  12/01/2016   Procedure: POLYPECTOMY;  Surgeon: Malissa Hippo, MD;  Location: [facility];  Service: Endoscopy;;  cecal polypectomy via cold biopsy   POSTERIOR LUMBAR FUSION  11/09/2015   L5-S1 with Pedicle Screws L2-LUMBAR SACRAL ONE/NOTES 11/09/2015   TONSILLECTOMY     TOTAL KNEE ARTHROPLASTY Left 01/03/2022   Procedure: TOTAL KNEE ARTHROPLASTY;  Surgeon: Durene Romans, MD;  Location: [facility];  Service: Orthopedics;  Laterality: Left;   TOTAL KNEE ARTHROPLASTY Right 02/21/2022   Procedure: TOTAL KNEE ARTHROPLASTY;  Surgeon: Durene Romans, MD;  Location: [facility];  Service: Orthopedics;  Laterality: Right;    History reviewed. No pertinent family history. Social History:  reports that she has never smoked. She has never used smokeless tobacco. She reports that she does not drink alcohol and does not use drugs.  Allergies:  Allergies  Allergen Reactions   Hydromorphone Rash   Procaine Other (See Comments)    Headache  Meloxicam Other (See Comments)    unknown   Oxycodone Nausea And Vomiting    Medications Prior to Admission  Medication Sig Dispense Refill   atorvastatin (LIPITOR) 20 MG tablet Take 20 mg by mouth daily.     Calcium Carb-Cholecalciferol (CALCIUM 600/VITAMIN D PO) Take 1 tablet by mouth daily.     celecoxib (CELEBREX) 200 MG capsule Take 200 mg by mouth daily.     cetirizine (ZYRTEC) 10 MG tablet Take 10 mg  by mouth daily.     denosumab (PROLIA) 60 MG/ML SOSY injection Inject 60 mg into the skin every 6 (six) months.     diltiazem (CARDIZEM) 30 MG tablet Take 1 tablet (30 mg total) 3 (three) times daily before meals by mouth. (Patient taking differently: Take 30 mg by mouth 2 (two) times daily.) 90 tablet 0   FLUoxetine (PROZAC) 40 MG capsule Take 40 mg by mouth daily.     HYDROcodone-acetaminophen (NORCO) 10-325 MG tablet Take 1 tablet by mouth every 6 (six) hours as needed for severe pain.     losartan-hydrochlorothiazide (HYZAAR) 50-12.5 MG tablet Take 1 tablet by mouth daily.     methocarbamol (ROBAXIN) 500 MG tablet Take 500 mg by mouth 4 (four) times daily as needed for muscle spasms.     montelukast (SINGULAIR) 10 MG tablet Take 10 mg by mouth daily.      Multiple Vitamins-Minerals (MULTIVITAMIN WITH MINERALS) tablet Take 1 tablet by mouth daily.     omeprazole (PRILOSEC) 40 MG capsule Take 30- 60 min before your first and last meals of the day (Patient taking differently: Take 40 mg by mouth daily.) 60 capsule 2   polyethylene glycol (MIRALAX / GLYCOLAX) 17 g packet Take 17 g by mouth daily as needed for mild constipation. 14 each 0   traMADol (ULTRAM) 50 MG tablet Take 50 mg by mouth every 6 (six) hours as needed for moderate pain.     Vibegron (GEMTESA) 75 MG TABS Take 75 mg by mouth daily.     XARELTO 20 MG TABS tablet Take 20 mg by mouth daily.     docusate sodium (COLACE) 100 MG capsule Take 1 capsule (100 mg total) by mouth 2 (two) times daily. (Patient not taking: Reported on 08/14/2023) 10 capsule 0   Fluticasone Furoate (ARNUITY ELLIPTA) 100 MCG/ACT AEPB One click each am (Patient taking differently: Inhale 1 puff into the lungs daily as needed (shortness of breath).) 30 each 11   HYDROcodone-acetaminophen (NORCO) 7.5-325 MG tablet Take 1-2 tablets by mouth every 4 (four) hours as needed for severe pain (pain score 7-10). (Patient not taking: Reported on 08/14/2023) 42 tablet 0    predniSONE (DELTASONE) 10 MG tablet Take 4 for two days three for two days two for two days one for two days (Patient not taking: Reported on 08/14/2023) 20 tablet 0   SUMAtriptan 6 MG/0.5ML SOAJ Inject 6 mg into the skin daily as needed (For migraine).      Results for orders placed or performed during the hospital encounter of 08/28/23 (from the past 48 hour(s))  Glucose, capillary     Status: Abnormal   Collection Time: 08/28/23  9:03 AM  Result Value Ref Range   Glucose-Capillary 114 (H) 70 - 99 mg/dL    Comment: Glucose reference range applies only to samples taken after fasting for at least 8 hours.   No results found.  ROS All pertinent positives and negatives are listed in HPI above  Blood pressure (!) 144/66, pulse  70, temperature (!) 97.5 F (36.4 C), temperature source Oral, resp. rate 18, height 5\' 3"  (1.6 m), weight 81.6 kg, SpO2 93%. 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  We discussed all risks, benefits and expected outcomes.  Informed consent was obtained and witnessed.  Clearance was obtained, she has been off of her anticoagulation.  Thank you for allowing me to participate in this patient's care.  Please do not hesitate to call with questions or concerns.   Monia Pouch, DO Neurosurgeon [facility] [phone]

## 2023-08-28 NOTE — Transfer of Care (Signed)
Immediate Anesthesia Transfer of Care Note  Patient: Bonnie Weeks  Procedure(s) Performed: OPEN LUMBAR ONE-TWO DECOMPRESSION AND TRANSFORAMINAL LUMBAR INTERBODY FUSION WITH EXTENSION OF POSTEROLATERAL FUSION THORACIC TEN-LUMBAR TWO (Spine Lumbar) Application of O-Arm  Patient Location: PACU  Anesthesia Type:General  Level of Consciousness: awake, alert , and oriented  Airway & Oxygen Therapy: Patient Spontanous Breathing and Patient connected to nasal cannula oxygen  Post-op Assessment: Report given to RN and Post -op Vital signs reviewed and stable  Post vital signs: Reviewed and stable  Last Vitals:  Vitals Value Taken Time  BP 142/71 08/28/23 1602  Temp    Pulse 81 08/28/23 1605  Resp 20 08/28/23 1605  SpO2 95 % 08/28/23 1605  Vitals shown include unfiled device data.  Last Pain:  Vitals:   08/28/23 0925  TempSrc:   PainSc: 8       Patients Stated Pain Goal: 4 (08/28/23 0925)  Complications: No notable events documented.

## 2023-08-29 LAB — CBC
HCT: 28.5 % — ABNORMAL LOW (ref 36.0–46.0)
Hemoglobin: 9.2 g/dL — ABNORMAL LOW (ref 12.0–15.0)
MCH: 29.3 pg (ref 26.0–34.0)
MCHC: 32.3 g/dL (ref 30.0–36.0)
MCV: 90.8 fL (ref 80.0–100.0)
Platelets: 243 10*3/uL (ref 150–400)
RBC: 3.14 MIL/uL — ABNORMAL LOW (ref 3.87–5.11)
RDW: 14.4 % (ref 11.5–15.5)
WBC: 8.8 10*3/uL (ref 4.0–10.5)
nRBC: 0 % (ref 0.0–0.2)

## 2023-08-29 MED ORDER — SODIUM CHLORIDE 0.9 % IV BOLUS
500.0000 mL | Freq: Once | INTRAVENOUS | Status: AC
  Administered 2023-08-29: 500 mL via INTRAVENOUS

## 2023-08-29 MED ORDER — HEPARIN SODIUM (PORCINE) 5000 UNIT/ML IJ SOLN
5000.0000 [IU] | Freq: Three times a day (TID) | INTRAMUSCULAR | Status: DC
  Administered 2023-08-30 – 2023-09-01 (×9): 5000 [IU] via SUBCUTANEOUS
  Filled 2023-08-29 (×10): qty 1

## 2023-08-29 MED ORDER — ALBUTEROL SULFATE (2.5 MG/3ML) 0.083% IN NEBU
2.5000 mg | INHALATION_SOLUTION | RESPIRATORY_TRACT | Status: DC | PRN
  Administered 2023-08-29: 2.5 mg via RESPIRATORY_TRACT
  Filled 2023-08-29: qty 3

## 2023-08-29 MED ORDER — BUDESONIDE 0.25 MG/2ML IN SUSP
0.2500 mg | Freq: Two times a day (BID) | RESPIRATORY_TRACT | Status: DC
  Administered 2023-08-29 – 2023-09-02 (×8): 0.25 mg via RESPIRATORY_TRACT
  Filled 2023-08-29 (×8): qty 2

## 2023-08-29 NOTE — Evaluation (Addendum)
Physical Therapy Evaluation Patient Details Name: Bonnie Weeks MRN: [id number] DOB: May 06, 1946 Today's Date: 08/29/2023  History of Present Illness  Patient is a 77 yo female presenting to the hospital for  L1-2 decompression, TLIF, and posterior lateral fusion T10-L2. PMH includes: aortic stenosis, hyperlipidemia, hypertension, pulmonary embolism, on Xarelto, with L2-S1 decompression and fusion.   Clinical Impression  Bonnie Weeks is 77 y.o. female admitted with above HPI and diagnosis. Patient is currently limited by functional impairments below (see PT problem list). Patient lives with spouse who is limited by dementia and pt is independent and caregiver to spouse at baseline. Currently pt requires min assist for bed mobility and CGA/min assist for transfers and gait with RW. Pt has some weakness in bil LE Lt>Rt and reports ongoing numbness/tingling in feet bil and Lt hand. PT asymptomatic with mobility but noted to have drop in BP following ambulation of ~15' and recovered with LE's elevated. Patient will benefit from continued skilled PT interventions to address impairments and progress independence with mobility. Acute PT will follow and progress as able.     Vitals During Session:  Position BP (MAP)  Sitting EOB 106/48 (65)  Seated post amb 88/55 (66)  2 min seated 78/39 (52)  Seated LE elevated 89/44 (58)      If plan is discharge home, recommend the following: A little help with walking and/or transfers;A little help with bathing/dressing/bathroom;Assistance with cooking/housework;Help with stairs or ramp for entrance;Assist for transportation   Can travel by private vehicle        Equipment Recommendations Rolling walker (2 wheels);BSC/3in1  Recommendations for Other Services       Functional Status Assessment Patient has had a recent decline in their functional status and demonstrates the ability to make significant improvements in function in a reasonable and predictable  amount of time.     Precautions / Restrictions Precautions Precautions: Back;Fall Precaution Booklet Issued: No (reviewed BLT) Required Braces or Orthoses: Spinal Brace Spinal Brace: Thoracolumbosacral orthotic (VertAlign (clamshell)) Restrictions Weight Bearing Restrictions: No      Mobility  Bed Mobility Overal bed mobility: Needs Assistance Bed Mobility: Rolling, Sidelying to Sit Rolling: Min assist Sidelying to sit: Min assist, HOB elevated       General bed mobility comments: cues for sequencing log roll technique, HOB slightly elevated, no use of rails. pt initaited flexing LE's and reaching UE's to side, min assist to fully achieve sidelying and pres sup through Rt elbow.    Transfers Overall transfer level: Needs assistance Equipment used: Rolling walker (2 wheels) Transfers: Sit to/from Stand Sit to Stand: Contact guard assist           General transfer comment: cues for hand position for power up from EOB to RW. CGA for safety.    Ambulation/Gait Ambulation/Gait assistance: Min assist, Contact guard assist Gait Distance (Feet): 15 Feet Assistive device: Rolling walker (2 wheels) Gait Pattern/deviations: Step-through pattern, Decreased stride length, Narrow base of support Gait velocity: decr     General Gait Details: short and low steps, overall slow/cautious pace. no LOB noted throughout. slight drift Lt as pt reports fatigue in Lt UE.  Stairs            Wheelchair Mobility     Tilt Bed    Modified Rankin (Stroke Patients Only)       Balance Overall balance assessment: Needs assistance Sitting-balance support: Feet supported Sitting balance-Leahy Scale: Good     Standing balance support: During functional activity, Reliant on  assistive device for balance, Bilateral upper extremity supported Standing balance-Leahy Scale: Fair                               Pertinent Vitals/Pain Pain Assessment Pain Assessment:  No/denies pain    Home Living Family/patient expects to be discharged to:: Private residence Living Arrangements: Spouse/significant other Available Help at Discharge: Family Type of Home: House Home Access: Stairs to enter Entrance Stairs-Rails: Can reach both Entrance Stairs-Number of Steps: 1   Home Layout: Two level Home Equipment: Agricultural consultant (2 wheels);BSC/3in1;Shower seat - built in Additional Comments: pt is primary caregiver to her husband who has dementia, her daughter is planning to come stay with her    Prior Function Prior Level of Function : Independent/Modified Independent                     Extremity/Trunk Assessment   Upper Extremity Assessment Upper Extremity Assessment: Defer to OT evaluation    Lower Extremity Assessment Lower Extremity Assessment: RLE deficits/detail;LLE deficits/detail RLE Deficits / Details: grossly 4/5 for hip flex, knee ext/flex, ankle DF/PF LLE Deficits / Details: grossly 4/5 for hip flex, knee ext, ankle DF/PF, 3+/5 for knee flex    Cervical / Trunk Assessment Cervical / Trunk Assessment: Normal;Back Surgery  Communication   Communication Communication: No apparent difficulties  Cognition Arousal: Alert Behavior During Therapy: WFL for tasks assessed/performed Overall Cognitive Status: Within Functional Limits for tasks assessed                                          General Comments      Exercises     Assessment/Plan    PT Assessment Patient needs continued PT services  PT Problem List Decreased strength       PT Treatment Interventions DME instruction;Gait training;Stair training;Functional mobility training;Therapeutic activities;Therapeutic exercise;Balance training;Neuromuscular re-education;Cognitive remediation;Patient/family education    PT Goals (Current goals can be found in the Care Plan section)  Acute Rehab PT Goals Patient Stated Goal: get home tomorrow PT Goal  Formulation: With patient Time For Goal Achievement: 09/12/23 Potential to Achieve Goals: Good    Frequency Min 1X/week     Co-evaluation               AM-PAC PT "6 Clicks" Mobility  Outcome Measure Help needed turning from your back to your side while in a flat bed without using bedrails?: A Little Help needed moving from lying on your back to sitting on the side of a flat bed without using bedrails?: A Little Help needed moving to and from a bed to a chair (including a wheelchair)?: A Little Help needed standing up from a chair using your arms (e.g., wheelchair or bedside chair)?: A Little Help needed to walk in hospital room?: A Little Help needed climbing 3-5 steps with a railing? : A Lot 6 Click Score: 17    End of Session Equipment Utilized During Treatment: Gait belt Activity Tolerance: Patient tolerated treatment well Patient left: with call bell/phone within reach Nurse Communication: Mobility status (BSC for toileting) PT Visit Diagnosis: Other abnormalities of gait and mobility (R26.89);Muscle weakness (generalized) (M62.81);Difficulty in walking, not elsewhere classified (R26.2);Other symptoms and signs involving the nervous system (R29.898)    Time: 7829-5621 PT Time Calculation (min) (ACUTE ONLY): 38 min   Charges:  PT Evaluation $PT Eval Moderate Complexity: 1 Mod   PT General Charges $$ ACUTE PT VISIT: 1 Visit         Wynn Maudlin, DPT Acute Rehabilitation Services Office [phone]  08/29/23 4:03 PM

## 2023-08-29 NOTE — Progress Notes (Signed)
Patient's BP 95/46 with map of 59. Patient asymptomatic. Notified PA, new orders placed

## 2023-08-29 NOTE — Progress Notes (Signed)
    Providing Compassionate, Quality Care - Together   NEUROSURGERY PROGRESS NOTE     S: No issues overnight.    O: EXAM:  BP (!) 106/53 (BP Location: Left Arm)   Pulse 76   Temp 97.6 F (36.4 C) (Oral)   Resp 17   Ht 5\' 3"  (1.6 m)   Wt 81.6 kg   SpO2 95%   BMI 31.89 kg/m     Awake, alert, oriented  Speech fluent, appropriate  BUE 5/5, BLE 4/5 SILTx4 Dressing c/d/I Hemovac in place.    ASSESSMENT:  77 y.o. s/p revision/extension PSF T10-L2, TLIF L1-2, POD#1    PLAN: -Continue hemovac -TLSO when OOB - vertilign -SQH tmrw for dvt ppx.  -Therapies as tolerated -Call w/ questions/concerns.   Patrici Ranks, [facility]

## 2023-08-29 NOTE — Progress Notes (Signed)
PT Cancellation Note  Patient Details Name: Bonnie Weeks MRN: [id number] DOB: 01-Oct-1946   Cancelled Treatment:    Reason Eval/Treat Not Completed: Other (comment) (Patient currently does not have TLSO, Ortho tech and RN awaiting MD confirmation as to which style of TLSO is needed. PT will follow at later date/time when pt able and schedule allows.)   Renaldo Fiddler PT, DPT Acute Rehabilitation Services Office [phone]  08/29/23 10:26 AM

## 2023-08-29 NOTE — Progress Notes (Signed)
Patient's BP 78/47 with map of 56. Patient asymptomatic. Patient also reporting shortness of breath and requesting albuterol. Notified PA. New orders received.

## 2023-08-29 NOTE — Evaluation (Signed)
Occupational Therapy Evaluation Patient Details Name: PAYCEN TECH MRN: [id number] DOB: 1946-01-11 Today's Date: 08/29/2023   History of Present Illness Patient is a 77 yo female presenting to the hospital for  L1-2 decompression, TLIF, and posterior lateral fusion T10-L2. PMH includes: aortic stenosis, hyperlipidemia, hypertension, pulmonary embolism, on Xarelto, with L2-S1 decompression and fusion.   Clinical Impression   Prior to this admission, patient living with her spouse, who she is the primary caretaker for as he has dementia, and is typically independent. Patient presenting with decreased knowledge of precautions, need for assist for ADLs and functional mobility, and progressively decreasing BP with ambulation and activity (see levels below). OT recommending HHOT at discharge; OT will follow acutely.   BP in sitting 106/48 (65) BP after ambulation: 88/55 (66) BP after sitting for 2 minutes: 78/39 (52) BP with legs elevated after 4 minutes: 89/44 (58)      If plan is discharge home, recommend the following: A lot of help with walking and/or transfers;A lot of help with bathing/dressing/bathroom;Assist for transportation;Help with stairs or ramp for entrance;Assistance with cooking/housework    Functional Status Assessment  Patient has had a recent decline in their functional status and demonstrates the ability to make significant improvements in function in a reasonable and predictable amount of time.  Equipment Recommendations  None recommended by OT (patient has DME needed)    Recommendations for Other Services       Precautions / Restrictions Precautions Precautions: Back;Fall Precaution Booklet Issued: No (reviewed BLT) Required Braces or Orthoses: Spinal Brace Spinal Brace: Thoracolumbosacral orthotic (VertAlign (clamshell)) Restrictions Weight Bearing Restrictions: No      Mobility Bed Mobility Overal bed mobility: Needs Assistance Bed Mobility: Rolling,  Sidelying to Sit Rolling: Min assist Sidelying to sit: Min assist, HOB elevated       General bed mobility comments: cues for sequencing log roll technique, HOB slightly elevated, no use of rails. pt initaited flexing LE's and reaching UE's to side, min assist to fully achieve sidelying and pres sup through Rt elbow.    Transfers Overall transfer level: Needs assistance Equipment used: Rolling walker (2 wheels) Transfers: Sit to/from Stand Sit to Stand: Contact guard assist           General transfer comment: cues for hand position for power up from EOB to RW. CGA for safety.      Balance Overall balance assessment: Needs assistance Sitting-balance support: Feet supported Sitting balance-Leahy Scale: Good     Standing balance support: During functional activity, Reliant on assistive device for balance, Bilateral upper extremity supported Standing balance-Leahy Scale: Fair                             ADL either performed or assessed with clinical judgement   ADL Overall ADL's : Needs assistance/impaired Eating/Feeding: Set up;Sitting   Grooming: Set up;Sitting   Upper Body Bathing: Minimal assistance;Sitting   Lower Body Bathing: Moderate assistance;Maximal assistance;Sit to/from stand;Sitting/lateral leans   Upper Body Dressing : Minimal assistance;Sitting   Lower Body Dressing: Moderate assistance;Maximal assistance;Sit to/from stand;Sitting/lateral leans   Toilet Transfer: Minimal assistance;Ambulation;Rolling walker (2 wheels) Toilet Transfer Details (indicate cue type and reason): simulated Toileting- Clothing Manipulation and Hygiene: Minimal assistance;Moderate assistance;Sit to/from stand;Sitting/lateral lean       Functional mobility during ADLs: Minimal assistance;Cueing for safety;Cueing for sequencing;Rolling walker (2 wheels) General ADL Comments: Patient presenting with decreased knowledge of precautions, need for assist for ADLs and  functional  mobility, and progressively decreasing BP with ambulation and activity (see levels below). OT recommending HHOT at discharge; OT will follow acutely.     Vision Baseline Vision/History: 1 Wears glasses (Contacts) Ability to See in Adequate Light: 0 Adequate Patient Visual Report: No change from baseline Vision Assessment?: No apparent visual deficits     Perception Perception: Not tested       Praxis Praxis: Not tested       Pertinent Vitals/Pain       Extremity/Trunk Assessment Upper Extremity Assessment Upper Extremity Assessment: Overall WFL for tasks assessed;Right hand dominant;LUE deficits/detail LUE Deficits / Details: numbness in L hand, however this is patient's baseline   Lower Extremity Assessment Lower Extremity Assessment: RLE deficits/detail;LLE deficits/detail RLE Deficits / Details: grossly 4/5 for hip flex, knee ext/flex, ankle DF/PF LLE Deficits / Details: grossly 4/5 for hip flex, knee ext, ankle DF/PF, 3+/5 for knee flex   Cervical / Trunk Assessment Cervical / Trunk Assessment: Normal;Back Surgery   Communication Communication Communication: No apparent difficulties   Cognition Arousal: Alert Behavior During Therapy: WFL for tasks assessed/performed Overall Cognitive Status: Within Functional Limits for tasks assessed                                       General Comments       Exercises     Shoulder Instructions      Home Living Family/patient expects to be discharged to:: Private residence Living Arrangements: Spouse/significant other Available Help at Discharge: Family Type of Home: House Home Access: Stairs to enter Secretary/administrator of Steps: 1 Entrance Stairs-Rails: Can reach both Home Layout: Two level     Bathroom Shower/Tub: Tub only (walk in tub)   Bathroom Toilet: Standard Bathroom Accessibility: Yes   Home Equipment: Agricultural consultant (2 wheels);BSC/3in1;Shower seat - built in   Additional  Comments: pt is primary caregiver to her husband who has dementia, her daughter is planning to come stay with her      Prior Functioning/Environment Prior Level of Function : Independent/Modified Independent                        OT Problem List: Decreased strength;Decreased range of motion;Decreased activity tolerance;Pain;Cardiopulmonary status limiting activity;Decreased safety awareness;Decreased knowledge of use of DME or AE;Decreased knowledge of precautions;Impaired balance (sitting and/or standing)      OT Treatment/Interventions: Self-care/ADL training;Therapeutic exercise;Energy conservation;DME and/or AE instruction;Therapeutic activities;Balance training;Patient/family education    OT Goals(Current goals can be found in the care plan section) Acute Rehab OT Goals Patient Stated Goal: to get better OT Goal Formulation: With patient Time For Goal Achievement: 09/12/23 Potential to Achieve Goals: Good ADL Goals Pt Will Perform Lower Body Bathing: Independently;sitting/lateral leans;sit to/from stand Pt Will Perform Lower Body Dressing: Independently;sit to/from stand;sitting/lateral leans Pt Will Transfer to Toilet: Independently;ambulating Pt Will Perform Toileting - Clothing Manipulation and hygiene: Independently;sitting/lateral leans;sit to/from stand Additional ADL Goal #1: Patient will be able to verbalize and adhere to back precautions without cues.  OT Frequency: Min 1X/week    Co-evaluation              AM-PAC OT "6 Clicks" Daily Activity     Outcome Measure Help from another person eating meals?: A Little Help from another person taking care of personal grooming?: A Little Help from another person toileting, which includes using toliet, bedpan, or urinal?: A Lot Help from  another person bathing (including washing, rinsing, drying)?: A Lot Help from another person to put on and taking off regular upper body clothing?: A Little Help from another  person to put on and taking off regular lower body clothing?: A Lot 6 Click Score: 15   End of Session Equipment Utilized During Treatment: Gait belt;Rolling walker (2 wheels);Back brace Nurse Communication: Mobility status  Activity Tolerance: Patient tolerated treatment well;Other (comment) (low BP) Patient left: in chair;with call bell/phone within reach;with chair alarm set;with family/visitor present;with nursing/sitter in room  OT Visit Diagnosis: Unsteadiness on feet (R26.81);Other abnormalities of gait and mobility (R26.89);Muscle weakness (generalized) (M62.81);Pain                Time: 2130-8657 OT Time Calculation (min): 38 min Charges:  OT General Charges $OT Visit: 1 Visit OT Evaluation $OT Eval Moderate Complexity: 1 Mod OT Treatments $Self Care/Home Management : 8-22 mins  Pollyann Glen E. Khadar Monger, OTR/L Acute Rehabilitation Services [phone]   Cherlyn Cushing 08/29/2023, 4:06 PM

## 2023-08-29 NOTE — Progress Notes (Signed)
OT Cancellation Note  Patient Details Name: Bonnie Weeks MRN: [id number] DOB: [birth year]   Cancelled Treatment:    Reason Eval/Treat Not Completed: Other (comment) Patient currently does not have TLSO, Ortho tech and RN awaiting MD confirmation as to which style of TLSO is needed. OT will continue to follow acutely.   Pollyann Glen E. Adalynn Corne, OTR/L Acute Rehabilitation Services [phone]   Cherlyn Cushing 08/29/2023, 8:26 AM

## 2023-08-30 LAB — CBC
HCT: 27.4 % — ABNORMAL LOW (ref 36.0–46.0)
Hemoglobin: 8.8 g/dL — ABNORMAL LOW (ref 12.0–15.0)
MCH: 30.2 pg (ref 26.0–34.0)
MCHC: 32.1 g/dL (ref 30.0–36.0)
MCV: 94.2 fL (ref 80.0–100.0)
Platelets: 204 10*3/uL (ref 150–400)
RBC: 2.91 MIL/uL — ABNORMAL LOW (ref 3.87–5.11)
RDW: 14.5 % (ref 11.5–15.5)
WBC: 9 10*3/uL (ref 4.0–10.5)
nRBC: 0 % (ref 0.0–0.2)

## 2023-08-30 NOTE — Progress Notes (Addendum)
Physical Therapy Treatment Patient Details Name: Bonnie Weeks MRN: [id number] DOB: [birth year] Today's Date: 08/30/2023   History of Present Illness Patient is a 77 yo female presenting to the hospital for  L1-2 decompression, TLIF, and posterior lateral fusion T10-L2. PMH includes: aortic stenosis, hyperlipidemia, hypertension, pulmonary embolism, on Xarelto, with L2-S1 decompression and fusion.    PT Comments  Pt is highly motivated to participate and improve, but was limited by her BP dropping along with symptoms of nausea that increased as the BP decreased. Pt also reported R leg numbness that is worse today than yesterday and worsened throughout the session. RN made aware, who was notifying PA. She was able to transition OOB and transfer via step pivot to the chair with a RW at a CGA level though. Pt will likely progress well once her BP becomes more stable. Will continue to follow acutely.   BP -  106/53 (66) supine 113/57 (74) sitting 66/38 (48) standing 86/42 (55) sitting after transferring from bed to chair 101/51 (64) sitting in chair after leg exercises     If plan is discharge home, recommend the following: A little help with walking and/or transfers;A little help with bathing/dressing/bathroom;Assistance with cooking/housework;Help with stairs or ramp for entrance;Assist for transportation   Can travel by private vehicle        Equipment Recommendations  Rolling walker (2 wheels);BSC/3in1    Recommendations for Other Services       Precautions / Restrictions Precautions Precautions: Back;Fall Precaution Booklet Issued: Yes (comment) Precaution Comments: watch BP; reviewed precautions, needs assistance to recall Required Braces or Orthoses: Spinal Brace Spinal Brace: Thoracolumbosacral orthotic;Applied in sitting position (VertAlign (clamshell)) Restrictions Weight Bearing Restrictions: No     Mobility  Bed Mobility Overal bed mobility: Needs Assistance Bed  Mobility: Rolling, Sidelying to Sit Rolling: Contact guard assist, Used rails Sidelying to sit: HOB elevated, Contact guard assist, Used rails       General bed mobility comments: Extra time and cues for technique to maintain precautions. Pt relying on R rail and HOB to be elevated (her bed elevates at home) to sit up R EOB, CGA for safety    Transfers Overall transfer level: Needs assistance Equipment used: Rolling walker (2 wheels) Transfers: Sit to/from Stand, Bed to chair/wheelchair/BSC Sit to Stand: Contact guard assist, From elevated surface   Step pivot transfers: Contact guard assist       General transfer comment: EOB elevated a couple inches for improved ease for pt with first transfer today. Extra time and cues for pt to push up from bed to stand, CGA for safety. CGA for safety step pivoting to R bed > recliner using RW for support, slow and pt limited by pain, nausea, and BP drop    Ambulation/Gait Ambulation/Gait assistance: Contact guard assist Gait Distance (Feet): 2 Feet Assistive device: Rolling walker (2 wheels) Gait Pattern/deviations: Step-through pattern, Decreased stride length, Narrow base of support Gait velocity: reduced Gait velocity interpretation: <1.31 ft/sec, indicative of household ambulator   General Gait Details: Pt takes slow, small steps to pivot to R bed to recliner with RW and CGA for safety. DIstance limited by pain, nausea, and BP dropping   Stairs             Wheelchair Mobility     Tilt Bed    Modified Rankin (Stroke Patients Only)       Balance Overall balance assessment: Needs assistance Sitting-balance support: Feet supported Sitting balance-Leahy Scale: Good Sitting balance - Comments: reaches  off COG to donn brace with modA from therapist   Standing balance support: During functional activity, Reliant on assistive device for balance, Bilateral upper extremity supported Standing balance-Leahy Scale: Poor Standing  balance comment: reliant on RW                            Cognition Arousal: Alert Behavior During Therapy: WFL for tasks assessed/performed Overall Cognitive Status: Within Functional Limits for tasks assessed                                 General Comments: slow to process at times, limited by pain; needs assistance to recall spinal precautions        Exercises General Exercises - Lower Extremity Ankle Circles/Pumps: AROM, Both, 10 reps, Seated Long Arc Quad: AROM, Both, 10 reps, Seated    General Comments General comments (skin integrity, edema, etc.): BP - 106/53 (66) supine, 113/57 (74) sitting, 66/38 (48) standing, 86/42 (55) sitting after transferring from bed to chair, 101/51 (64) sitting in chair after leg exercises; pt reported nausea that worsened with positional changes; pt reported R leg numbness that is worse today and worsened with mobility throughout session; RN aware of BP, symptoms, and R leg numbness and notifying PA end of session      Pertinent Vitals/Pain Pain Assessment Pain Assessment: Faces Faces Pain Scale: Hurts even more Pain Location: back Pain Descriptors / Indicators: Discomfort, Grimacing, Operative site guarding Pain Intervention(s): Limited activity within patient's tolerance, Monitored during session, Repositioned, RN gave pain meds during session    Home Living                          Prior Function            PT Goals (current goals can now be found in the care plan section) Acute Rehab PT Goals Patient Stated Goal: improve and go home PT Goal Formulation: With patient Time For Goal Achievement: 09/12/23 Potential to Achieve Goals: Good Progress towards PT goals: Not progressing toward goals - comment (limited by BP dropping)    Frequency    Min 5X/week      PT Plan      Co-evaluation              AM-PAC PT "6 Clicks" Mobility   Outcome Measure  Help needed turning from your  back to your side while in a flat bed without using bedrails?: A Little Help needed moving from lying on your back to sitting on the side of a flat bed without using bedrails?: A Little Help needed moving to and from a bed to a chair (including a wheelchair)?: A Little Help needed standing up from a chair using your arms (e.g., wheelchair or bedside chair)?: A Little Help needed to walk in hospital room?: Total (<20 ft, limited by BP) Help needed climbing 3-5 steps with a railing? : Total 6 Click Score: 14    End of Session Equipment Utilized During Treatment: Gait belt;Back brace Activity Tolerance: Other (comment) (limited by symptomatic orthostatic hypotension) Patient left: with call bell/phone within reach;in chair;with chair alarm set Nurse Communication: Mobility status (BP dropping; increasing numbness R leg) PT Visit Diagnosis: Other abnormalities of gait and mobility (R26.89);Muscle weakness (generalized) (M62.81);Difficulty in walking, not elsewhere classified (R26.2);Other symptoms and signs involving the nervous system (R29.898);Unsteadiness on feet (R26.81)  Time: 7253-6644 PT Time Calculation (min) (ACUTE ONLY): 35 min  Charges:    $Therapeutic Activity: 23-37 mins PT General Charges $$ ACUTE PT VISIT: 1 Visit                     Virgil Benedict, PT, DPT Acute Rehabilitation Services  Office: [phone]    Bettina Gavia 08/30/2023, 9:04 AM

## 2023-08-30 NOTE — Progress Notes (Addendum)
PT worked with her this morning. Her BP dropped to 66/38 when she stood up and she became symptomatic with dizziness and nausea. After 10 minutes of sitting her BP recovered to 101/51. She is also reporting numbness in her right leg which increased when she started working with PT. Notified PA

## 2023-08-30 NOTE — Progress Notes (Signed)
    Providing Compassionate, Quality Care - Together   NEUROSURGERY PROGRESS NOTE     S: No issues overnight.    O: EXAM:  BP (!) 105/46 (BP Location: Left Arm)   Pulse 75   Temp 98.3 F (36.8 C) (Oral)   Resp 15   Ht 5\' 3"  (1.6 m)   Wt 81.6 kg   SpO2 99%   BMI 31.89 kg/m      Awake, alert, oriented  Speech fluent, appropriate  BUE 5/5, BLE 4+/5 SILTx4 Dressing c/d/I Hemovac in place   ASSESSMENT:  77 y.o. s/p revision/extension PSF T10-L2, TLIF L1-2, POD#2     PLAN: -Hold home antihypertensives. -Cont hemovac -TLSO when OOB. PT/OT as tolerated.  -SQH today, dvt ppx.  -Call w/ questions/concerns.    Patrici Ranks, [facility]

## 2023-08-31 NOTE — Progress Notes (Signed)
Physical Therapy Treatment Patient Details Name: FERNANDO VANHORN MRN: [id number] DOB: 1946-02-06 Today's Date: 08/31/2023   History of Present Illness Patient is a 77 yo female presenting to the hospital for  L1-2 decompression, TLIF, and posterior lateral fusion T10-L2. PMH includes: aortic stenosis, hyperlipidemia, hypertension, pulmonary embolism, on Xarelto, with L2-S1 decompression and fusion.    PT Comments  Pt is doing better today, only reporting mild numbness in her R leg today. She also displays some soft BP, but it improved with standing mobility. See below. As her BP remained more stable she was able to progress her gait today. She ambulated x3 bouts of ~20-50 ft distances with a RW and CGA for safety. She was limited during one bout by her becoming emotional and having difficulty catching her breathe, but this rapidly improved once she sat down and calmed herself. Her ease with transfers has greatly improved as well. Will continue to follow acutely.   BP -  96/44 (59) sitting start of session 85/68 (76) standing 103/70 (83) standing after ambulating ~8 ft 110/58 (74) sitting after ambulating the x3 bouts  *pt reported feeling nauseated, hot, and "blurry"    If plan is discharge home, recommend the following: A little help with walking and/or transfers;A little help with bathing/dressing/bathroom;Assistance with cooking/housework;Help with stairs or ramp for entrance;Assist for transportation   Can travel by private vehicle        Equipment Recommendations  Rolling walker (2 wheels);BSC/3in1    Recommendations for Other Services       Precautions / Restrictions Precautions Precautions: Back;Fall Precaution Booklet Issued: Yes (comment) Precaution Comments: watch BP; reviewed precautions; hemovac Required Braces or Orthoses: Spinal Brace Spinal Brace: Thoracolumbosacral orthotic;Applied in sitting position (VertAlign (clamshell)) Restrictions Weight Bearing  Restrictions: No     Mobility  Bed Mobility               General bed mobility comments: Pt up in recliner upon arrival and at end of session    Transfers Overall transfer level: Needs assistance Equipment used: Rolling walker (2 wheels) Transfers: Sit to/from Stand Sit to Stand: Contact guard assist           General transfer comment: Pt demonstrated improved ease and speed with powering up to stand, appropriately placing her UEs without cues, x1 from toilet, x1 from recliner, x1 from chair, CGA for safety    Ambulation/Gait Ambulation/Gait assistance: Contact guard assist Gait Distance (Feet): 50 Feet (x3 bouts of ~20 ft > ~50 ft > ~42 ft) Assistive device: Rolling walker (2 wheels) Gait Pattern/deviations: Step-through pattern, Decreased stride length Gait velocity: reduced Gait velocity interpretation: <1.31 ft/sec, indicative of household ambulator   General Gait Details: Pt takes slow, small steps. No LOB, CGA for safety. Pt had to sit the second bout due to getting emotional and then reporting difficulty breathing, which rapidly improved once she sat down and stopped crying.   Stairs             Wheelchair Mobility     Tilt Bed    Modified Rankin (Stroke Patients Only)       Balance Overall balance assessment: Needs assistance Sitting-balance support: Feet supported Sitting balance-Leahy Scale: Good     Standing balance support: During functional activity, Reliant on assistive device for balance, Bilateral upper extremity supported Standing balance-Leahy Scale: Poor Standing balance comment: reliant on RW  Cognition Arousal: Alert Behavior During Therapy: WFL for tasks assessed/performed Overall Cognitive Status: Within Functional Limits for tasks assessed                                 General Comments: slow to process at times, limited by pain; needs assistance for spinal  precautions        Exercises      General Comments General comments (skin integrity, edema, etc.): BP - 96/44 (59) sitting start of session, 85/68 (76) standing, 103/70 (83) standing after ambulating ~8 ft, 110/58 (74) sitting after ambulating the x3 bouts; pt reported feeling nauseated, hot, and "blurry"; provided gait belt to pt and daughter and educated them on her risk for falls and need for assistance, they verbalized understanding; pt reports only mild numbness in R leg today, better than yesterday      Pertinent Vitals/Pain Pain Assessment Pain Assessment: Faces Faces Pain Scale: Hurts little more Pain Location: back Pain Descriptors / Indicators: Discomfort, Grimacing, Operative site guarding Pain Intervention(s): Limited activity within patient's tolerance, Monitored during session, Premedicated before session, Repositioned    Home Living                          Prior Function            PT Goals (current goals can now be found in the care plan section) Acute Rehab PT Goals Patient Stated Goal: improve and go home PT Goal Formulation: With patient/family Time For Goal Achievement: 09/12/23 Potential to Achieve Goals: Good Progress towards PT goals: Progressing toward goals    Frequency    Min 5X/week      PT Plan      Co-evaluation              AM-PAC PT "6 Clicks" Mobility   Outcome Measure  Help needed turning from your back to your side while in a flat bed without using bedrails?: A Little Help needed moving from lying on your back to sitting on the side of a flat bed without using bedrails?: A Little Help needed moving to and from a bed to a chair (including a wheelchair)?: A Little Help needed standing up from a chair using your arms (e.g., wheelchair or bedside chair)?: A Little Help needed to walk in hospital room?: A Little Help needed climbing 3-5 steps with a railing? : Total 6 Click Score: 16    End of Session Equipment  Utilized During Treatment: Gait belt;Back brace Activity Tolerance: Patient tolerated treatment well Patient left: with call bell/phone within reach;in chair;with chair alarm set;with family/visitor present   PT Visit Diagnosis: Other abnormalities of gait and mobility (R26.89);Muscle weakness (generalized) (M62.81);Difficulty in walking, not elsewhere classified (R26.2);Other symptoms and signs involving the nervous system (R29.898);Unsteadiness on feet (R26.81)     Time: 9562-1308 PT Time Calculation (min) (ACUTE ONLY): 33 min  Charges:    $Gait Training: 8-22 mins $Therapeutic Activity: 8-22 mins PT General Charges $$ ACUTE PT VISIT: 1 Visit                     Virgil Benedict, PT, DPT Acute Rehabilitation Services  Office: [phone]    Bettina Gavia 08/31/2023, 11:58 AM

## 2023-08-31 NOTE — Progress Notes (Signed)
   Providing Compassionate, Quality Care - Together  NEUROSURGERY PROGRESS NOTE   S: No issues overnight.  Feeling better today  O: EXAM:  BP (!) 132/55 (BP Location: Left Arm)   Pulse 98   Temp 98.3 F (36.8 C) (Oral)   Resp 16   Ht 5\' 3"  (1.6 m)   Wt 81.6 kg   SpO2 97%   BMI 31.89 kg/m   Awake, alert, oriented  PERRL Speech fluent, appropriate  CNs grossly intact  5/5 BUE/BLE  Hemovac in place  ASSESSMENT:  77 y.o. female with   s/p revision/extension PSF T10-L2, TLIF L1-2, POD#2   PLAN: -Hold antihypertensives for now -DC planning Sunday with home health care -Overall progressing appropriately, less orthostatic  Thank you for allowing me to participate in this patient's care.  Please do not hesitate to call with questions or concerns.   Monia Pouch, DO Neurosurgeon [facility] [phone]

## 2023-08-31 NOTE — Progress Notes (Signed)
Occupational Therapy Treatment Patient Details Name: Bonnie Weeks MRN: [id number] DOB: 1946-09-18 Today's Date: 08/31/2023   History of present illness Patient is a 77 yo female presenting to the hospital for  L1-2 decompression, TLIF, and posterior lateral fusion T10-L2. PMH includes: aortic stenosis, hyperlipidemia, hypertension, pulmonary embolism, on Xarelto, with L2-S1 decompression and fusion.   OT comments  Session focus on functional ambulation to toilet and back and engaging in ADL management. Patient mod A for ADL management, but requiring increased assist for lower body ADLs, patient min A for bed mobility and transfers. OT recommendation remains appropriate, will continue to follow.      If plan is discharge home, recommend the following:  A lot of help with walking and/or transfers;A lot of help with bathing/dressing/bathroom;Assist for transportation;Help with stairs or ramp for entrance;Assistance with cooking/housework   Equipment Recommendations  None recommended by OT (Patient has DME needed)    Recommendations for Other Services      Precautions / Restrictions Precautions Precautions: Back;Fall Precaution Booklet Issued: Yes (comment) Precaution Comments: watch BP; reviewed precautions; hemovac Required Braces or Orthoses: Spinal Brace Spinal Brace: Thoracolumbosacral orthotic;Applied in sitting position (VertAlign (clamshell)) Restrictions Weight Bearing Restrictions: No       Mobility Bed Mobility Overal bed mobility: Needs Assistance Bed Mobility: Rolling, Sidelying to Sit Rolling: Contact guard assist, Used rails Sidelying to sit: HOB elevated, Used rails, Min assist       General bed mobility comments: Min A to assist trunk into full upright position    Transfers Overall transfer level: Needs assistance Equipment used: Rolling walker (2 wheels) Transfers: Sit to/from Stand Sit to Stand: Min assist           General transfer comment: Min A  from bed and from toilet, using grab bars to come into standing, good RW management throughout     Balance Overall balance assessment: Needs assistance Sitting-balance support: Feet supported Sitting balance-Leahy Scale: Good     Standing balance support: During functional activity, Reliant on assistive device for balance, Bilateral upper extremity supported Standing balance-Leahy Scale: Poor Standing balance comment: reliant on RW                           ADL either performed or assessed with clinical judgement   ADL Overall ADL's : Needs assistance/impaired Eating/Feeding: Set up;Sitting   Grooming: Wash/dry hands;Wash/dry face;Sitting;Set up       Lower Body Bathing: Moderate assistance;Maximal assistance;Sit to/from stand;Sitting/lateral leans       Lower Body Dressing: Moderate assistance;Maximal assistance;Sit to/from stand;Sitting/lateral leans Lower Body Dressing Details (indicate cue type and reason): donning and doffing mesh underwear and socks Toilet Transfer: Minimal assistance;Ambulation;Rolling walker (2 wheels) Toilet Transfer Details (indicate cue type and reason): to toilet and back Toileting- Clothing Manipulation and Hygiene: Total assistance;Sit to/from stand Toileting - Clothing Manipulation Details (indicate cue type and reason): unable to complete peri-care in standing     Functional mobility during ADLs: Moderate assistance;Cueing for sequencing;Cueing for safety;Rolling walker (2 wheels) General ADL Comments: Session focus on functional ambulation to toilet and back and engaging in ADL management. Patient mod A for ADL management, but requiring increased assist for lower body ADLs, patient min A for bed mobility and transfers. OT recommendation remains appropriate, will continue to follow.    Extremity/Trunk Assessment              Vision       Perception  Praxis      Cognition Arousal: Alert Behavior During Therapy: WFL  for tasks assessed/performed Overall Cognitive Status: Within Functional Limits for tasks assessed                                 General Comments: slow to process at times, limited by pain; needs assistance for spinal precautions        Exercises      Shoulder Instructions       General Comments BP - 96/44 (59) sitting start of session, 85/68 (76) standing, 103/70 (83) standing after ambulating ~8 ft, 110/58 (74) sitting after ambulating the x3 bouts; pt reported feeling nauseated, hot, and "blurry"; provided gait belt to pt and daughter and educated them on her risk for falls and need for assistance, they verbalized understanding; pt reports only mild numbness in R leg today, better than yesterday    Pertinent Vitals/ Pain       Pain Assessment Pain Assessment: Faces Faces Pain Scale: Hurts little more Pain Location: back Pain Descriptors / Indicators: Discomfort, Grimacing, Operative site guarding Pain Intervention(s): Limited activity within patient's tolerance, Monitored during session, Repositioned  Home Living                                          Prior Functioning/Environment              Frequency  Min 1X/week        Progress Toward Goals  OT Goals(current goals can now be found in the care plan section)  Progress towards OT goals: Progressing toward goals  Acute Rehab OT Goals Patient Stated Goal: to get better OT Goal Formulation: With patient Time For Goal Achievement: 09/12/23 Potential to Achieve Goals: Good  Plan      Co-evaluation                 AM-PAC OT "6 Clicks" Daily Activity     Outcome Measure   Help from another person eating meals?: A Little Help from another person taking care of personal grooming?: A Little Help from another person toileting, which includes using toliet, bedpan, or urinal?: A Lot Help from another person bathing (including washing, rinsing, drying)?: A Lot Help from  another person to put on and taking off regular upper body clothing?: A Little Help from another person to put on and taking off regular lower body clothing?: A Lot 6 Click Score: 15    End of Session Equipment Utilized During Treatment: Gait belt;Rolling walker (2 wheels);Back brace  OT Visit Diagnosis: Unsteadiness on feet (R26.81);Other abnormalities of gait and mobility (R26.89);Muscle weakness (generalized) (M62.81);Pain   Activity Tolerance Patient tolerated treatment well   Patient Left in chair;with call bell/phone within reach;with chair alarm set   Nurse Communication Mobility status        Time: 2130-8657 OT Time Calculation (min): 30 min  Charges: OT General Charges $OT Visit: 1 Visit OT Treatments $Self Care/Home Management : 23-37 mins  Pollyann Glen E. Nyajah Hyson, OTR/L Acute Rehabilitation Services [phone]   Cherlyn Cushing 08/31/2023, 3:21 PM

## 2023-08-31 NOTE — TOC Initial Note (Addendum)
Transition of Care (TOC) - Initial/Assessment Note  Donn Pierini RN,BSN Transitions of Care Unit 4NP (Non Trauma)- RN Case Manager See Treatment Team for direct Phone #   Patient Details  Name: Bonnie Weeks MRN: [id number] Date of Birth: 08-Mar-1946  Transition of Care [facility]) CM/SW Contact:    Darrold Span, RN Phone Number: 08/31/2023, 3:07 PM  Clinical Narrative:                 Noted recommendations for HHPT/OT,  CM spoke with pt at bedside, daughter-sharon and sister also present.  Discussed transition needs for [facility] and DME- per pt she already has needed DME at home- RW, [facility] and rollator as well.  Choice offered for [facility] needs list provided  Per CMS guidelines from [employer] website with star ratings (copy placed in shadow chart) Per pt she has used [facility] in past and would like to use them again, pt is hopeful that she will progress to outpt later when able to.  Needs HH- face to face placed for HHPT/OT  Address, phone # (mobile preferred) and PCP all confirmed, family to transport home.   Call made to [facility]- referral has been accepted with a planned start of care next week- possible d/c Sun/Mon.   No further TOC needs noted at this time.   Expected Discharge Plan: Home w Home Health Services Barriers to Discharge: Continued Medical Work up   Patient Goals and CMS Choice Patient states their goals for this hospitalization and ongoing recovery are:: return home CMS Medicare.gov Compare Post Acute Care list provided to:: Patient Choice offered to / list presented to : Patient      Expected Discharge Plan and Services   Discharge Planning Services: CM Consult Post Acute Care Choice: Home Health Living arrangements for the past 2 months: Single Family Home                 DME Arranged: N/A DME Agency: NA       HH Arranged: PT, OT HH Agency: [facility] Date [facility] Agency Contacted: 08/31/23 Time HH  Agency Contacted: 1507 Representative spoke with at [facility] Agency: Judeth Cornfield  Prior Living Arrangements/Services Living arrangements for the past 2 months: Single Family Home Lives with:: Self, Relatives Patient language and need for interpreter reviewed:: Yes Do you feel safe going back to the place where you live?: Yes      Need for Family Participation in Patient Care: Yes (Comment) Care giver support system in place?: Yes (comment) Current home services: DME (rolling walker, BSC, rollator) Criminal Activity/Legal Involvement Pertinent to Current Situation/Hospitalization: No - Comment as needed  Activities of Daily Living      Permission Sought/Granted Permission sought to share information with : Facility Industrial/product designer granted to share information with : Yes, Verbal Permission Granted     Permission granted to share info w AGENCY: [facility]        Emotional Assessment Appearance:: Appears stated age Attitude/Demeanor/Rapport: Engaged Affect (typically observed): Accepting, Appropriate Orientation: : Oriented to Self, Oriented to Place, Oriented to  Time, Oriented to Situation Alcohol / Substance Use: Not Applicable Psych Involvement: No (comment)  Admission diagnosis:  Lumbar spinal stenosis [M48.061] Patient Active Problem List   Diagnosis Date Noted   Lumbar spinal stenosis 08/28/2023   DOE (dyspnea on exertion) 08/05/2022   Upper 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 cough variant asthma 08/04/2022   S/P total knee arthroplasty, 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 [place] [address] Phone: [phone] Fax: [phone]     Social Determinants of Health (SDOH) Social History: SDOH Screenings   Tobacco Use: Low Risk  (08/28/2023)   SDOH Interventions:     Readmission Risk Interventions     No data to display

## 2023-08-31 NOTE — Care Management Important Message (Signed)
Important Message  Patient Details  Name: Bonnie Weeks MRN: [id number] Date of Birth: 1946/07/03   Important Message Given:  Yes - Medicare IM     Sherilyn Banker 08/31/2023, 1:46 PM

## 2023-09-01 NOTE — Plan of Care (Signed)
  Problem: Education: Goal: Ability to verbalize activity precautions or restrictions will improve Outcome: Progressing Goal: Understanding of discharge needs will improve Outcome: Progressing   Problem: Activity: Goal: Ability to avoid complications of mobility impairment will improve Outcome: Progressing Goal: Ability to tolerate increased activity will improve Outcome: Progressing Goal: Will remain free from falls Outcome: Progressing   Problem: Clinical Measurements: Goal: Ability to maintain clinical measurements within normal limits will improve Outcome: Progressing Goal: Postoperative complications will be avoided or minimized Outcome: Progressing   Problem: Education: Goal: Knowledge of General Education information will improve Description: Including pain rating scale, medication(s)/side effects and non-pharmacologic comfort measures Outcome: Progressing   Problem: Clinical Measurements: Goal: Ability to maintain clinical measurements within normal limits will improve Outcome: Progressing Goal: Will remain free from infection Outcome: Progressing Goal: Respiratory complications will improve Outcome: Progressing Goal: Cardiovascular complication will be avoided Outcome: Progressing   Problem: Pain Management: Goal: Pain level will decrease Outcome: Not Progressing

## 2023-09-01 NOTE — Progress Notes (Signed)
Physical Therapy Treatment Patient Details Name: Bonnie Weeks MRN: [id number] DOB: 1946-01-28 Today's Date: 09/01/2023   History of Present Illness Patient is a 77 yo female presenting to the hospital for  L1-2 decompression, TLIF, and posterior lateral fusion T10-L2. PMH includes: aortic stenosis, hyperlipidemia, hypertension, pulmonary embolism, on Xarelto, with L2-S1 decompression and fusion.    PT Comments  Pt reports experiencing more pain today, specifically in her R buttocks which she describes as "sciatic pain", but pt motivated to participate and push herself to make progress in gait distance today. Noted soft bump to palpation at superior aspect of incision on back, notified RN. Pt demonstrated difficulty with bed mobility without rails, but only needed light minA to complete ascending her trunk to sit up EOB from supine with HOB elevated to simulate her home environment. She was able to ambulate an increased distance of up to ~100 ft during 1 of 3 gait bouts today. Pt needs cues to clear her R foot when ambulating as it does tend to display decreased dorsiflexion during swing but does not quite drag either. She reports she was doing this when ambulating prior to surgery. Will continue to follow acutely.       If plan is discharge home, recommend the following: A little help with walking and/or transfers;A little help with bathing/dressing/bathroom;Assistance with cooking/housework;Help with stairs or ramp for entrance;Assist for transportation   Can travel by private vehicle        Equipment Recommendations  Rolling walker (2 wheels);BSC/3in1    Recommendations for Other Services       Precautions / Restrictions Precautions Precautions: Back;Fall Precaution Booklet Issued: Yes (comment) Precaution Comments: watch BP; reviewed precautions Required Braces or Orthoses: Spinal Brace Spinal Brace: Thoracolumbosacral orthotic;Applied in sitting position (VertAlign  (clamshell)) Restrictions Weight Bearing Restrictions: No     Mobility  Bed Mobility Overal bed mobility: Needs Assistance Bed Mobility: Rolling, Sidelying to Sit Rolling: Contact guard assist Sidelying to sit: Min assist, HOB elevated       General bed mobility comments: Rails dropped but HOB elevated to simulate her bed at home. Extra time and cues to log roll, CGA for safety. Pt able to bring legs off EOB and ascend trunk without assistance but requested x1 HHA at the end of trunk ascension to complete the transition, minA    Transfers Overall transfer level: Needs assistance Equipment used: Rolling walker (2 wheels) Transfers: Sit to/from Stand Sit to Stand: Contact guard assist           General transfer comment: No LOB, slow to power up to stand, CGA for safety    Ambulation/Gait Ambulation/Gait assistance: Contact guard assist Gait Distance (Feet): 100 Feet (x3 bouts of ~20 ft > ~100 ft > ~40 ft) Assistive device: Rolling walker (2 wheels) Gait Pattern/deviations: Step-through pattern, Decreased stride length, Decreased dorsiflexion - right, Decreased step length - right, Narrow base of support Gait velocity: reduced Gait velocity interpretation: <1.31 ft/sec, indicative of household ambulator   General Gait Details: Pt takes slow, small steps. No LOB, CGA for safety. Noted pt with decreased R step length and foot clearance, which she reports was occurring prior to surgery. Cued pt to clear foot for safety. Gait limited by R buttocks "sciatic" pain today.   Stairs             Wheelchair Mobility     Tilt Bed    Modified Rankin (Stroke Patients Only)       Balance Overall balance assessment: Needs  assistance Sitting-balance support: Feet supported Sitting balance-Leahy Scale: Good     Standing balance support: During functional activity, Reliant on assistive device for balance, Bilateral upper extremity supported Standing balance-Leahy Scale:  Poor Standing balance comment: reliant on RW                            Cognition Arousal: Alert Behavior During Therapy: WFL for tasks assessed/performed Overall Cognitive Status: Within Functional Limits for tasks assessed                                 General Comments: slow to process at times, limited by pain; needs assistance for spinal precautions        Exercises      General Comments General comments (skin integrity, edema, etc.): noted soft bump at superior aspect of incision on back - notified RN who assessed it, no MDs assigned to pt available to secure chat at the time      Pertinent Vitals/Pain Pain Assessment Pain Assessment: Faces Faces Pain Scale: Hurts even more Pain Location: back, R buttocks "sciatic" pain Pain Descriptors / Indicators: Discomfort, Grimacing, Operative site guarding, Shooting, Numbness Pain Intervention(s): Monitored during session, Limited activity within patient's tolerance, Repositioned, Patient requesting pain meds-RN notified    Home Living                          Prior Function            PT Goals (current goals can now be found in the care plan section) Acute Rehab PT Goals Patient Stated Goal: improve and go home PT Goal Formulation: With patient/family Time For Goal Achievement: 09/12/23 Potential to Achieve Goals: Good Progress towards PT goals: Progressing toward goals    Frequency    Min 5X/week      PT Plan      Co-evaluation              AM-PAC PT "6 Clicks" Mobility   Outcome Measure  Help needed turning from your back to your side while in a flat bed without using bedrails?: A Little Help needed moving from lying on your back to sitting on the side of a flat bed without using bedrails?: A Little Help needed moving to and from a bed to a chair (including a wheelchair)?: A Little Help needed standing up from a chair using your arms (e.g., wheelchair or bedside  chair)?: A Little Help needed to walk in hospital room?: A Little Help needed climbing 3-5 steps with a railing? : Total 6 Click Score: 16    End of Session Equipment Utilized During Treatment: Gait belt;Back brace Activity Tolerance: Patient tolerated treatment well;Patient limited by pain Patient left: with call bell/phone within reach;in chair;with chair alarm set;with nursing/sitter in room Nurse Communication: Mobility status;Patient requests pain meds;Other (comment) (soft bump at superior aspect of incision at back) PT Visit Diagnosis: Other abnormalities of gait and mobility (R26.89);Muscle weakness (generalized) (M62.81);Difficulty in walking, not elsewhere classified (R26.2);Other symptoms and signs involving the nervous system (R29.898);Unsteadiness on feet (R26.81)     Time: 2725-3664 PT Time Calculation (min) (ACUTE ONLY): 34 min  Charges:    $Gait Training: 8-22 mins $Therapeutic Activity: 8-22 mins PT General Charges $$ ACUTE PT VISIT: 1 Visit  Virgil Benedict, PT, DPT Acute Rehabilitation Services  Office: [phone]    Bettina Gavia 09/01/2023, 6:02 PM

## 2023-09-01 NOTE — Plan of Care (Signed)
Problem: Education: Goal: Ability to verbalize activity precautions or restrictions will improve Outcome: Progressing Goal: Knowledge of the prescribed therapeutic regimen will improve Outcome: Progressing Goal: Understanding of discharge needs will improve Outcome: Progressing   Problem: Activity: Goal: Ability to avoid complications of mobility impairment will improve Outcome: Progressing Goal: Ability to tolerate increased activity will improve Outcome: Progressing Goal: Will remain free from falls Outcome: Progressing

## 2023-09-01 NOTE — Progress Notes (Signed)
Subjective: NAEs  Objective: Vital signs in last 24 hours: Temp:  [97.8 F (36.6 C)-98.4 F (36.9 C)] 98.4 F (36.9 C) (10/12 0715) Pulse Rate:  [73-98] 73 (10/12 0715) Resp:  [15-20] 16 (10/12 0715) BP: (93-138)/(44-64) 138/64 (10/12 0715) SpO2:  [90 %-97 %] 93 % (10/12 0836)  Intake/Output from previous day: 10/11 0701 - 10/12 0700 In: -  Out: 300 [Urine:300] Intake/Output this shift: Total I/O In: -  Out: 650 [Urine:650]  NAD Sitting in chair with TLSO brace FC x 4  Lab Results: Recent Labs    08/29/23 1141 08/30/23 1056  WBC 8.8 9.0  HGB 9.2* 8.8*  HCT 28.5* 27.4*  PLT 243 204   BMET No results for input(s): "NA", "K", "CL", "CO2", "GLUCOSE", "BUN", "CREATININE", "CALCIUM" in the last 72 hours.  Studies/Results: No results found.  Assessment/Plan: S/p T10-L2 PSIF   LOS: 4 days  - likely dc home Sunday or Monday with home health   Bonnie Weeks 09/01/2023, 9:34 AM

## 2023-09-02 MED ORDER — HYDROCODONE-ACETAMINOPHEN 5-325 MG PO TABS: 1.0000 | ORAL_TABLET | ORAL | 0 refills | Status: AC | PRN

## 2023-09-02 MED ORDER — DOCUSATE SODIUM 100 MG PO CAPS: 100.0000 mg | ORAL_CAPSULE | Freq: Two times a day (BID) | ORAL | 0 refills | Status: AC

## 2023-09-02 MED ORDER — METHOCARBAMOL 500 MG PO TABS: 500.0000 mg | ORAL_TABLET | Freq: Four times a day (QID) | ORAL | 2 refills | Status: AC | PRN

## 2023-09-02 NOTE — Plan of Care (Signed)
Problem: Education: Goal: Ability to verbalize activity precautions or restrictions will improve 09/02/2023 1329 by Dannielle Burn, RN Outcome: Adequate for Discharge 09/02/2023 1306 by Dannielle Burn, RN Outcome: Progressing Goal: Knowledge of the prescribed therapeutic regimen will improve 09/02/2023 1329 by Dannielle Burn, RN Outcome: Adequate for Discharge 09/02/2023 1306 by Dannielle Burn, RN Outcome: Progressing Goal: Understanding of discharge needs will improve 09/02/2023 1329 by Dannielle Burn, RN Outcome: Adequate for Discharge 09/02/2023 1306 by Dannielle Burn, RN Outcome: Progressing   Problem: Activity: Goal: Ability to avoid complications of mobility impairment will improve 09/02/2023 1329 by Dannielle Burn, RN Outcome: Adequate for Discharge 09/02/2023 1306 by Dannielle Burn, RN Outcome: Progressing Goal: Ability to tolerate increased activity will improve 09/02/2023 1329 by Dannielle Burn, RN Outcome: Adequate for Discharge 09/02/2023 1306 by Dannielle Burn, RN Outcome: Progressing Goal: Will remain free from falls 09/02/2023 1329 by Dannielle Burn, RN Outcome: Adequate for Discharge 09/02/2023 1306 by Dannielle Burn, RN Outcome: Progressing   Problem: Bowel/Gastric: Goal: Gastrointestinal status for postoperative course will improve 09/02/2023 1329 by Dannielle Burn, RN Outcome: Adequate for Discharge 09/02/2023 1306 by Dannielle Burn, RN Outcome: Progressing   Problem: Clinical Measurements: Goal: Ability to maintain clinical measurements within normal limits will improve 09/02/2023 1329 by Dannielle Burn, RN Outcome: Adequate for Discharge 09/02/2023 1306 by Dannielle Burn, RN Outcome: Progressing Goal: Postoperative complications will be avoided or minimized 09/02/2023 1329 by Dannielle Burn, RN Outcome: Adequate for Discharge 09/02/2023 1306 by Dannielle Burn, RN Outcome: Progressing Goal: Diagnostic test  results will improve 09/02/2023 1329 by Dannielle Burn, RN Outcome: Adequate for Discharge 09/02/2023 1306 by Dannielle Burn, RN Outcome: Progressing   Problem: Pain Management: Goal: Pain level will decrease 09/02/2023 1329 by Dannielle Burn, RN Outcome: Adequate for Discharge 09/02/2023 1306 by Dannielle Burn, RN Outcome: Progressing   Problem: Skin Integrity: Goal: Will show signs of wound healing 09/02/2023 1329 by Dannielle Burn, RN Outcome: Adequate for Discharge 09/02/2023 1306 by Dannielle Burn, RN Outcome: Progressing   Problem: Health Behavior/Discharge Planning: Goal: Identification of resources available to assist in meeting health care needs will improve 09/02/2023 1329 by Dannielle Burn, RN Outcome: Adequate for Discharge 09/02/2023 1306 by Dannielle Burn, RN Outcome: Progressing   Problem: Bladder/Genitourinary: Goal: Urinary functional status for postoperative course will improve 09/02/2023 1329 by Dannielle Burn, RN Outcome: Adequate for Discharge 09/02/2023 1306 by Dannielle Burn, RN Outcome: Progressing   Problem: Education: Goal: Knowledge of General Education information will improve Description: Including pain rating scale, medication(s)/side effects and non-pharmacologic comfort measures 09/02/2023 1329 by Dannielle Burn, RN Outcome: Adequate for Discharge 09/02/2023 1306 by Dannielle Burn, RN Outcome: Progressing   Problem: Health Behavior/Discharge Planning: Goal: Ability to manage health-related needs will improve 09/02/2023 1329 by Dannielle Burn, RN Outcome: Adequate for Discharge 09/02/2023 1306 by Dannielle Burn, RN Outcome: Progressing   Problem: Clinical Measurements: Goal: Ability to maintain clinical measurements within normal limits will improve 09/02/2023 1329 by Dannielle Burn, RN Outcome: Adequate for Discharge 09/02/2023 1306 by Dannielle Burn, RN Outcome: Progressing Goal: Will remain free  from infection 09/02/2023 1329 by Dannielle Burn, RN Outcome: Adequate for Discharge 09/02/2023 1306 by Dannielle Burn, RN Outcome: Progressing Goal: Diagnostic test results will improve 09/02/2023 1329 by Dannielle Burn, RN Outcome: Adequate for Discharge 09/02/2023 1306 by Dannielle Burn, RN Outcome: Progressing  Goal: Respiratory complications will improve 09/02/2023 1329 by Dannielle Burn, RN Outcome: Adequate for Discharge 09/02/2023 1306 by Dannielle Burn, RN Outcome: Progressing Goal: Cardiovascular complication will be avoided 09/02/2023 1329 by Dannielle Burn, RN Outcome: Adequate for Discharge 09/02/2023 1306 by Dannielle Burn, RN Outcome: Progressing   Problem: Activity: Goal: Risk for activity intolerance will decrease 09/02/2023 1329 by Dannielle Burn, RN Outcome: Adequate for Discharge 09/02/2023 1306 by Dannielle Burn, RN Outcome: Progressing   Problem: Nutrition: Goal: Adequate nutrition will be maintained 09/02/2023 1329 by Dannielle Burn, RN Outcome: Adequate for Discharge 09/02/2023 1306 by Dannielle Burn, RN Outcome: Progressing   Problem: Coping: Goal: Level of anxiety will decrease 09/02/2023 1329 by Dannielle Burn, RN Outcome: Adequate for Discharge 09/02/2023 1306 by Dannielle Burn, RN Outcome: Progressing   Problem: Elimination: Goal: Will not experience complications related to bowel motility 09/02/2023 1329 by Dannielle Burn, RN Outcome: Adequate for Discharge 09/02/2023 1306 by Dannielle Burn, RN Outcome: Progressing Goal: Will not experience complications related to urinary retention 09/02/2023 1329 by Dannielle Burn, RN Outcome: Adequate for Discharge 09/02/2023 1306 by Dannielle Burn, RN Outcome: Progressing   Problem: Pain Managment: Goal: General experience of comfort will improve 09/02/2023 1329 by Dannielle Burn, RN Outcome: Adequate for Discharge 09/02/2023 1306 by Dannielle Burn,  RN Outcome: Progressing   Problem: Safety: Goal: Ability to remain free from injury will improve 09/02/2023 1329 by Dannielle Burn, RN Outcome: Adequate for Discharge 09/02/2023 1306 by Dannielle Burn, RN Outcome: Progressing   Problem: Skin Integrity: Goal: Risk for impaired skin integrity will decrease 09/02/2023 1329 by Dannielle Burn, RN Outcome: Adequate for Discharge 09/02/2023 1306 by Dannielle Burn, RN Outcome: Progressing

## 2023-09-02 NOTE — Plan of Care (Signed)

## 2023-09-02 NOTE — TOC Transition Note (Signed)
Transition of Care [facility]) - CM/SW Discharge Note   Patient Details  Name: Bonnie Weeks MRN: [id number] Date of Birth: [birth year]  Transition of Care [facility]) CM/SW Contact:  Ronny Bacon, RN Phone Number: 09/02/2023, 1:38 PM   Clinical Narrative:   Patient is being discharged today. Floor nurse confirmed that patient does not need home O2. Call to [facility] to inform of patient discharging today,    Final next level of care: Home w Home Health Services Barriers to Discharge: No Barriers Identified   Patient Goals and CMS Choice CMS Medicare.gov Compare Post Acute Care list provided to:: Patient Choice offered to / list presented to : Patient  Discharge Placement                         Discharge Plan and Services Additional resources added to the After Visit Summary for     Discharge Planning Services: CM Consult Post Acute Care Choice: Home Health          DME Arranged: N/A DME Agency: NA       HH Arranged: PT, OT HH Agency: [facility] Date [facility] Agency Contacted: 08/31/23 Time HH Agency Contacted: [id number] Representative spoke with at [facility] Agency: Judeth Cornfield  Social Determinants of Health (SDOH) Interventions SDOH Screenings   Tobacco Use: Low Risk  (08/28/2023)     Readmission Risk Interventions     No data to display

## 2023-09-02 NOTE — Discharge Summary (Signed)
Patient ID: Bonnie Weeks MRN: [id number] DOB/AGE: 1946/02/07 77 y.o.  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 female who was admitted following an uncomplicated revision/extension TL fusion T10-L2. They were recovered in PACU and transferred to 4NP. Hospital course was uncomplicated. Pt stable for discharge today. Pt to f/u in office for routine post op visit. Pt is in agreement w/ plan.    Discharge Exam: Blood pressure (!) 147/65, pulse 79, temperature 97.9 F (36.6 C), temperature source Oral, resp. rate 20, height 5\' 3"  (1.6 m), weight 81.6 kg, SpO2 94%. A&O  Speech fluent, appropriate Strength grossly intact  SILTx4.  Dressing c/d/I.   Disposition: Discharge disposition: 06-Home-Health Care Svc       Discharge Instructions     Incentive spirometry RT   Complete by: As directed       Allergies as of 09/02/2023       Reactions   Hydromorphone Rash   Procaine Other (See Comments)   Headache   Meloxicam Other (See Comments)   unknown   Oxycodone Nausea And Vomiting        Medication List     STOP taking these medications    celecoxib 200 MG capsule Commonly known as: CELEBREX   HYDROcodone-acetaminophen 10-325 MG tablet Commonly known as: NORCO   HYDROcodone-acetaminophen 7.5-325 MG tablet Commonly known as: NORCO Replaced by: HYDROcodone-acetaminophen 5-325 MG tablet       TAKE these medications    Arnuity Ellipta 100 MCG/ACT Aepb Generic drug: Fluticasone Furoate One click each am What changed:  how much to take how to take this when to take this reasons to take this additional  instructions   atorvastatin 20 MG tablet Commonly known as: LIPITOR Take 20 mg by mouth daily.   CALCIUM 600/VITAMIN D PO Take 1 tablet by mouth daily.   cetirizine 10 MG tablet Commonly known as: ZYRTEC Take 10 mg by mouth daily.   denosumab 60 MG/ML Sosy injection Commonly known as: PROLIA Inject 60 mg into the skin every 6 (six) months.   diltiazem 30 MG tablet Commonly known as: CARDIZEM Take 1 tablet (30 mg total) 3 (three) times daily before meals by mouth. What changed: when to take this   docusate sodium 100 MG capsule Commonly known as: COLACE Take 1 capsule (100 mg total) by mouth 2 (two) times daily.   FLUoxetine 40 MG capsule Commonly known as: PROZAC Take 40 mg by mouth daily.   Gemtesa 75 MG Tabs Generic drug: Vibegron Take 75 mg by mouth daily.   HYDROcodone-acetaminophen 5-325 MG tablet Commonly known as: NORCO/VICODIN Take 1 tablet by mouth every 4 (four) hours as needed for moderate pain. Replaces: HYDROcodone-acetaminophen 7.5-325 MG tablet   losartan-hydrochlorothiazide 50-12.5 MG tablet Commonly known as: HYZAAR Take 1 tablet by mouth daily.   methocarbamol 500 MG tablet Commonly known as: ROBAXIN Take 500 mg by mouth 4 (four) times daily as needed for muscle spasms. What changed: Another medication with the same name was added. Make sure you understand how and when to take each.   methocarbamol 500 MG tablet Commonly known as: ROBAXIN Take 1 tablet (500 mg total) by mouth every 6 (six) hours as needed for muscle spasms. What changed: You were already taking a medication  with the same name, and this prescription was added. Make sure you understand how and when to take each.   montelukast 10 MG tablet Commonly known as: SINGULAIR Take 10 mg by mouth daily.   multivitamin with minerals tablet Take 1 tablet by mouth daily.   omeprazole 40 MG capsule Commonly known as: PRILOSEC Take 30- 60 min before your first and last meals of the  day What changed:  how much to take how to take this when to take this additional instructions   polyethylene glycol 17 g packet Commonly known as: MIRALAX / GLYCOLAX Take 17 g by mouth daily as needed for mild constipation.   predniSONE 10 MG tablet Commonly known as: DELTASONE Take 4 for two days three for two days two for two days one for two days   SUMAtriptan 6 MG/0.5ML Soaj Inject 6 mg into the skin daily as needed (For migraine).   traMADol 50 MG tablet Commonly known as: ULTRAM Take 50 mg by mouth every 6 (six) hours as needed for moderate pain.   Xarelto 20 MG Tabs tablet Generic drug: rivaroxaban Take 1 tablet (20 mg total) by mouth daily. Start taking on: September 04, 2023 What changed: These instructions start on September 04, 2023. If you are unsure what to do until then, ask your doctor or other care provider.        Follow-up Information     [facility] Home Health Care Follow up.   Why: [facility] HHPT/OT arranged- they will contact you to schedule within 48hr post discharge Contact information: [address] [place] [address] [phone]                 Signed: Clovis Riley 09/02/2023, 9:21 AM

## 2023-09-17 DIAGNOSIS — Z6831 Body mass index (BMI) 31.0-31.9, adult: Secondary | ICD-10-CM | POA: Diagnosis not present

## 2023-09-17 DIAGNOSIS — M4316 Spondylolisthesis, lumbar region: Secondary | ICD-10-CM | POA: Diagnosis not present

## 2023-09-26 DIAGNOSIS — Z7901 Long term (current) use of anticoagulants: Secondary | ICD-10-CM | POA: Diagnosis not present

## 2023-09-26 DIAGNOSIS — Z4789 Encounter for other orthopedic aftercare: Secondary | ICD-10-CM | POA: Diagnosis not present

## 2023-09-26 DIAGNOSIS — M4316 Spondylolisthesis, lumbar region: Secondary | ICD-10-CM | POA: Diagnosis not present

## 2023-09-26 DIAGNOSIS — Z791 Long term (current) use of non-steroidal anti-inflammatories (NSAID): Secondary | ICD-10-CM | POA: Diagnosis not present

## 2023-10-05 DIAGNOSIS — Z791 Long term (current) use of non-steroidal anti-inflammatories (NSAID): Secondary | ICD-10-CM | POA: Diagnosis not present

## 2023-10-05 DIAGNOSIS — Z4789 Encounter for other orthopedic aftercare: Secondary | ICD-10-CM | POA: Diagnosis not present

## 2023-10-05 DIAGNOSIS — M4316 Spondylolisthesis, lumbar region: Secondary | ICD-10-CM | POA: Diagnosis not present

## 2023-10-05 DIAGNOSIS — Z7901 Long term (current) use of anticoagulants: Secondary | ICD-10-CM | POA: Diagnosis not present

## 2023-10-10 DIAGNOSIS — Z4789 Encounter for other orthopedic aftercare: Secondary | ICD-10-CM | POA: Diagnosis not present

## 2023-10-10 DIAGNOSIS — Z7901 Long term (current) use of anticoagulants: Secondary | ICD-10-CM | POA: Diagnosis not present

## 2023-10-10 DIAGNOSIS — Z791 Long term (current) use of non-steroidal anti-inflammatories (NSAID): Secondary | ICD-10-CM | POA: Diagnosis not present

## 2023-10-10 DIAGNOSIS — M4316 Spondylolisthesis, lumbar region: Secondary | ICD-10-CM | POA: Diagnosis not present

## 2023-10-19 DIAGNOSIS — Z4789 Encounter for other orthopedic aftercare: Secondary | ICD-10-CM | POA: Diagnosis not present

## 2023-10-19 DIAGNOSIS — Z7901 Long term (current) use of anticoagulants: Secondary | ICD-10-CM | POA: Diagnosis not present

## 2023-10-19 DIAGNOSIS — Z791 Long term (current) use of non-steroidal anti-inflammatories (NSAID): Secondary | ICD-10-CM | POA: Diagnosis not present

## 2023-10-19 DIAGNOSIS — M4316 Spondylolisthesis, lumbar region: Secondary | ICD-10-CM | POA: Diagnosis not present

## 2023-10-26 DIAGNOSIS — Z4789 Encounter for other orthopedic aftercare: Secondary | ICD-10-CM | POA: Diagnosis not present

## 2023-10-26 DIAGNOSIS — Z791 Long term (current) use of non-steroidal anti-inflammatories (NSAID): Secondary | ICD-10-CM | POA: Diagnosis not present

## 2023-10-26 DIAGNOSIS — M4316 Spondylolisthesis, lumbar region: Secondary | ICD-10-CM | POA: Diagnosis not present

## 2023-10-26 DIAGNOSIS — Z7901 Long term (current) use of anticoagulants: Secondary | ICD-10-CM | POA: Diagnosis not present

## 2023-10-31 DIAGNOSIS — Z4789 Encounter for other orthopedic aftercare: Secondary | ICD-10-CM | POA: Diagnosis not present

## 2023-10-31 DIAGNOSIS — Z791 Long term (current) use of non-steroidal anti-inflammatories (NSAID): Secondary | ICD-10-CM | POA: Diagnosis not present

## 2023-10-31 DIAGNOSIS — Z7901 Long term (current) use of anticoagulants: Secondary | ICD-10-CM | POA: Diagnosis not present

## 2023-10-31 DIAGNOSIS — M4316 Spondylolisthesis, lumbar region: Secondary | ICD-10-CM | POA: Diagnosis not present

## 2023-11-07 ENCOUNTER — Encounter (INDEPENDENT_AMBULATORY_CARE_PROVIDER_SITE_OTHER): Payer: Self-pay | Admitting: *Deleted

## 2023-11-12 DIAGNOSIS — M7061 Trochanteric bursitis, right hip: Secondary | ICD-10-CM | POA: Diagnosis not present

## 2023-11-29 DIAGNOSIS — E782 Mixed hyperlipidemia: Secondary | ICD-10-CM | POA: Diagnosis not present

## 2023-11-29 DIAGNOSIS — I2699 Other pulmonary embolism without acute cor pulmonale: Secondary | ICD-10-CM | POA: Diagnosis not present

## 2023-11-29 DIAGNOSIS — I10 Essential (primary) hypertension: Secondary | ICD-10-CM | POA: Diagnosis not present

## 2023-11-29 DIAGNOSIS — I77810 Thoracic aortic ectasia: Secondary | ICD-10-CM | POA: Diagnosis not present

## 2023-11-29 DIAGNOSIS — R600 Localized edema: Secondary | ICD-10-CM | POA: Diagnosis not present

## 2023-11-29 DIAGNOSIS — I350 Nonrheumatic aortic (valve) stenosis: Secondary | ICD-10-CM | POA: Diagnosis not present

## 2023-11-29 DIAGNOSIS — R9431 Abnormal electrocardiogram [ECG] [EKG]: Secondary | ICD-10-CM | POA: Diagnosis not present

## 2024-02-14 DIAGNOSIS — M4316 Spondylolisthesis, lumbar region: Secondary | ICD-10-CM | POA: Diagnosis not present

## 2024-02-18 DIAGNOSIS — M4316 Spondylolisthesis, lumbar region: Secondary | ICD-10-CM | POA: Diagnosis not present

## 2024-02-20 DIAGNOSIS — M4316 Spondylolisthesis, lumbar region: Secondary | ICD-10-CM | POA: Diagnosis not present

## 2024-02-25 DIAGNOSIS — M4722 Other spondylosis with radiculopathy, cervical region: Secondary | ICD-10-CM | POA: Diagnosis not present

## 2024-02-25 DIAGNOSIS — Z6830 Body mass index (BMI) 30.0-30.9, adult: Secondary | ICD-10-CM | POA: Diagnosis not present

## 2024-02-25 DIAGNOSIS — M7061 Trochanteric bursitis, right hip: Secondary | ICD-10-CM | POA: Diagnosis not present

## 2024-03-04 DIAGNOSIS — M4316 Spondylolisthesis, lumbar region: Secondary | ICD-10-CM | POA: Diagnosis not present

## 2024-03-12 DIAGNOSIS — M4316 Spondylolisthesis, lumbar region: Secondary | ICD-10-CM | POA: Diagnosis not present

## 2024-03-18 DIAGNOSIS — M4316 Spondylolisthesis, lumbar region: Secondary | ICD-10-CM | POA: Diagnosis not present

## 2024-03-25 DIAGNOSIS — M4316 Spondylolisthesis, lumbar region: Secondary | ICD-10-CM | POA: Diagnosis not present

## 2024-04-01 DIAGNOSIS — M4316 Spondylolisthesis, lumbar region: Secondary | ICD-10-CM | POA: Diagnosis not present

## 2024-04-16 DIAGNOSIS — Z79899 Other long term (current) drug therapy: Secondary | ICD-10-CM | POA: Diagnosis not present

## 2024-04-16 DIAGNOSIS — M19011 Primary osteoarthritis, right shoulder: Secondary | ICD-10-CM | POA: Diagnosis not present

## 2024-04-16 DIAGNOSIS — M25511 Pain in right shoulder: Secondary | ICD-10-CM | POA: Diagnosis not present

## 2024-04-16 DIAGNOSIS — I10 Essential (primary) hypertension: Secondary | ICD-10-CM | POA: Diagnosis not present

## 2024-04-16 DIAGNOSIS — Z885 Allergy status to narcotic agent status: Secondary | ICD-10-CM | POA: Diagnosis not present

## 2024-04-16 DIAGNOSIS — M25512 Pain in left shoulder: Secondary | ICD-10-CM | POA: Diagnosis not present

## 2024-04-16 DIAGNOSIS — S42002A Fracture of unspecified part of left clavicle, initial encounter for closed fracture: Secondary | ICD-10-CM | POA: Diagnosis not present

## 2024-04-16 DIAGNOSIS — X58XXXA Exposure to other specified factors, initial encounter: Secondary | ICD-10-CM | POA: Diagnosis not present

## 2024-04-16 DIAGNOSIS — E785 Hyperlipidemia, unspecified: Secondary | ICD-10-CM | POA: Diagnosis not present

## 2024-04-16 DIAGNOSIS — M85811 Other specified disorders of bone density and structure, right shoulder: Secondary | ICD-10-CM | POA: Diagnosis not present

## 2024-04-16 DIAGNOSIS — M19012 Primary osteoarthritis, left shoulder: Secondary | ICD-10-CM | POA: Diagnosis not present

## 2024-04-24 DIAGNOSIS — M25511 Pain in right shoulder: Secondary | ICD-10-CM | POA: Diagnosis not present

## 2024-04-24 DIAGNOSIS — M5030 Other cervical disc degeneration, unspecified cervical region: Secondary | ICD-10-CM | POA: Diagnosis not present

## 2024-04-24 DIAGNOSIS — M898X1 Other specified disorders of bone, shoulder: Secondary | ICD-10-CM | POA: Diagnosis not present

## 2024-04-24 DIAGNOSIS — M75101 Unspecified rotator cuff tear or rupture of right shoulder, not specified as traumatic: Secondary | ICD-10-CM | POA: Diagnosis not present

## 2024-04-24 DIAGNOSIS — S42032A Displaced fracture of lateral end of left clavicle, initial encounter for closed fracture: Secondary | ICD-10-CM | POA: Diagnosis not present

## 2024-04-30 DIAGNOSIS — S42212A Unspecified displaced fracture of surgical neck of left humerus, initial encounter for closed fracture: Secondary | ICD-10-CM | POA: Diagnosis not present

## 2024-04-30 DIAGNOSIS — I10 Essential (primary) hypertension: Secondary | ICD-10-CM | POA: Diagnosis not present

## 2024-04-30 DIAGNOSIS — Z7901 Long term (current) use of anticoagulants: Secondary | ICD-10-CM | POA: Diagnosis not present

## 2024-04-30 DIAGNOSIS — K219 Gastro-esophageal reflux disease without esophagitis: Secondary | ICD-10-CM | POA: Diagnosis not present

## 2024-04-30 DIAGNOSIS — M25412 Effusion, left shoulder: Secondary | ICD-10-CM | POA: Diagnosis not present

## 2024-04-30 DIAGNOSIS — S42032D Displaced fracture of lateral end of left clavicle, subsequent encounter for fracture with routine healing: Secondary | ICD-10-CM | POA: Diagnosis not present

## 2024-04-30 DIAGNOSIS — Z79899 Other long term (current) drug therapy: Secondary | ICD-10-CM | POA: Diagnosis not present

## 2024-04-30 DIAGNOSIS — S42292D Other displaced fracture of upper end of left humerus, subsequent encounter for fracture with routine healing: Secondary | ICD-10-CM | POA: Diagnosis not present

## 2024-04-30 DIAGNOSIS — W010XXA Fall on same level from slipping, tripping and stumbling without subsequent striking against object, initial encounter: Secondary | ICD-10-CM | POA: Diagnosis not present

## 2024-04-30 DIAGNOSIS — E785 Hyperlipidemia, unspecified: Secondary | ICD-10-CM | POA: Diagnosis not present

## 2024-05-01 DIAGNOSIS — S42202A Unspecified fracture of upper end of left humerus, initial encounter for closed fracture: Secondary | ICD-10-CM | POA: Diagnosis not present

## 2024-05-01 DIAGNOSIS — W228XXA Striking against or struck by other objects, initial encounter: Secondary | ICD-10-CM | POA: Diagnosis not present

## 2024-05-01 DIAGNOSIS — S42032A Displaced fracture of lateral end of left clavicle, initial encounter for closed fracture: Secondary | ICD-10-CM | POA: Diagnosis not present

## 2024-05-14 DIAGNOSIS — S42032D Displaced fracture of lateral end of left clavicle, subsequent encounter for fracture with routine healing: Secondary | ICD-10-CM | POA: Diagnosis not present

## 2024-05-14 DIAGNOSIS — S42292D Other displaced fracture of upper end of left humerus, subsequent encounter for fracture with routine healing: Secondary | ICD-10-CM | POA: Diagnosis not present

## 2024-05-20 DIAGNOSIS — E559 Vitamin D deficiency, unspecified: Secondary | ICD-10-CM | POA: Diagnosis not present

## 2024-05-20 DIAGNOSIS — R5383 Other fatigue: Secondary | ICD-10-CM | POA: Diagnosis not present

## 2024-05-20 DIAGNOSIS — Z0001 Encounter for general adult medical examination with abnormal findings: Secondary | ICD-10-CM | POA: Diagnosis not present

## 2024-05-20 DIAGNOSIS — F4321 Adjustment disorder with depressed mood: Secondary | ICD-10-CM | POA: Diagnosis not present

## 2024-05-20 DIAGNOSIS — N950 Postmenopausal bleeding: Secondary | ICD-10-CM | POA: Diagnosis not present

## 2024-05-20 DIAGNOSIS — Z1329 Encounter for screening for other suspected endocrine disorder: Secondary | ICD-10-CM | POA: Diagnosis not present

## 2024-05-20 DIAGNOSIS — D559 Anemia due to enzyme disorder, unspecified: Secondary | ICD-10-CM | POA: Diagnosis not present

## 2024-05-20 DIAGNOSIS — I4891 Unspecified atrial fibrillation: Secondary | ICD-10-CM | POA: Diagnosis not present

## 2024-05-20 DIAGNOSIS — E7849 Other hyperlipidemia: Secondary | ICD-10-CM | POA: Diagnosis not present

## 2024-06-02 DIAGNOSIS — S42292D Other displaced fracture of upper end of left humerus, subsequent encounter for fracture with routine healing: Secondary | ICD-10-CM | POA: Diagnosis not present

## 2024-06-02 DIAGNOSIS — S42032D Displaced fracture of lateral end of left clavicle, subsequent encounter for fracture with routine healing: Secondary | ICD-10-CM | POA: Diagnosis not present

## 2024-06-04 DIAGNOSIS — N950 Postmenopausal bleeding: Secondary | ICD-10-CM | POA: Diagnosis not present

## 2024-06-18 DIAGNOSIS — S42292D Other displaced fracture of upper end of left humerus, subsequent encounter for fracture with routine healing: Secondary | ICD-10-CM | POA: Diagnosis not present

## 2024-06-18 DIAGNOSIS — S42032D Displaced fracture of lateral end of left clavicle, subsequent encounter for fracture with routine healing: Secondary | ICD-10-CM | POA: Diagnosis not present

## 2024-06-24 DIAGNOSIS — S42032D Displaced fracture of lateral end of left clavicle, subsequent encounter for fracture with routine healing: Secondary | ICD-10-CM | POA: Diagnosis not present

## 2024-06-24 DIAGNOSIS — S42292D Other displaced fracture of upper end of left humerus, subsequent encounter for fracture with routine healing: Secondary | ICD-10-CM | POA: Diagnosis not present

## 2024-06-30 DIAGNOSIS — N950 Postmenopausal bleeding: Secondary | ICD-10-CM | POA: Diagnosis not present

## 2024-06-30 DIAGNOSIS — N858 Other specified noninflammatory disorders of uterus: Secondary | ICD-10-CM | POA: Diagnosis not present

## 2024-06-30 DIAGNOSIS — Z01419 Encounter for gynecological examination (general) (routine) without abnormal findings: Secondary | ICD-10-CM | POA: Diagnosis not present

## 2024-07-01 DIAGNOSIS — S42032D Displaced fracture of lateral end of left clavicle, subsequent encounter for fracture with routine healing: Secondary | ICD-10-CM | POA: Diagnosis not present

## 2024-07-01 DIAGNOSIS — S42292D Other displaced fracture of upper end of left humerus, subsequent encounter for fracture with routine healing: Secondary | ICD-10-CM | POA: Diagnosis not present

## 2024-07-08 DIAGNOSIS — S42032D Displaced fracture of lateral end of left clavicle, subsequent encounter for fracture with routine healing: Secondary | ICD-10-CM | POA: Diagnosis not present

## 2024-07-08 DIAGNOSIS — S42292D Other displaced fracture of upper end of left humerus, subsequent encounter for fracture with routine healing: Secondary | ICD-10-CM | POA: Diagnosis not present

## 2024-07-15 DIAGNOSIS — H5203 Hypermetropia, bilateral: Secondary | ICD-10-CM | POA: Diagnosis not present

## 2024-07-15 DIAGNOSIS — H2513 Age-related nuclear cataract, bilateral: Secondary | ICD-10-CM | POA: Diagnosis not present

## 2024-07-16 DIAGNOSIS — I10 Essential (primary) hypertension: Secondary | ICD-10-CM | POA: Diagnosis not present

## 2024-07-16 DIAGNOSIS — J45909 Unspecified asthma, uncomplicated: Secondary | ICD-10-CM | POA: Diagnosis not present

## 2024-07-16 DIAGNOSIS — F32A Depression, unspecified: Secondary | ICD-10-CM | POA: Diagnosis not present

## 2024-07-16 DIAGNOSIS — K219 Gastro-esophageal reflux disease without esophagitis: Secondary | ICD-10-CM | POA: Diagnosis not present

## 2024-07-16 DIAGNOSIS — Z86711 Personal history of pulmonary embolism: Secondary | ICD-10-CM | POA: Diagnosis not present

## 2024-07-16 DIAGNOSIS — Z923 Personal history of irradiation: Secondary | ICD-10-CM | POA: Diagnosis not present

## 2024-07-16 DIAGNOSIS — N950 Postmenopausal bleeding: Secondary | ICD-10-CM | POA: Diagnosis not present

## 2024-07-16 DIAGNOSIS — Z853 Personal history of malignant neoplasm of breast: Secondary | ICD-10-CM | POA: Diagnosis not present

## 2024-07-16 DIAGNOSIS — S42292D Other displaced fracture of upper end of left humerus, subsequent encounter for fracture with routine healing: Secondary | ICD-10-CM | POA: Diagnosis not present

## 2024-07-16 DIAGNOSIS — S42032D Displaced fracture of lateral end of left clavicle, subsequent encounter for fracture with routine healing: Secondary | ICD-10-CM | POA: Diagnosis not present

## 2024-07-16 DIAGNOSIS — N858 Other specified noninflammatory disorders of uterus: Secondary | ICD-10-CM | POA: Diagnosis not present

## 2024-07-17 DIAGNOSIS — N72 Inflammatory disease of cervix uteri: Secondary | ICD-10-CM | POA: Diagnosis not present

## 2024-07-17 DIAGNOSIS — N719 Inflammatory disease of uterus, unspecified: Secondary | ICD-10-CM | POA: Diagnosis not present

## 2024-07-18 DIAGNOSIS — N950 Postmenopausal bleeding: Secondary | ICD-10-CM | POA: Diagnosis not present

## 2024-07-18 DIAGNOSIS — N858 Other specified noninflammatory disorders of uterus: Secondary | ICD-10-CM | POA: Diagnosis not present

## 2024-07-18 DIAGNOSIS — Z86711 Personal history of pulmonary embolism: Secondary | ICD-10-CM | POA: Diagnosis not present

## 2024-07-18 DIAGNOSIS — F32A Depression, unspecified: Secondary | ICD-10-CM | POA: Diagnosis not present

## 2024-07-18 DIAGNOSIS — Z923 Personal history of irradiation: Secondary | ICD-10-CM | POA: Diagnosis not present

## 2024-07-18 DIAGNOSIS — K219 Gastro-esophageal reflux disease without esophagitis: Secondary | ICD-10-CM | POA: Diagnosis not present

## 2024-07-18 DIAGNOSIS — Z853 Personal history of malignant neoplasm of breast: Secondary | ICD-10-CM | POA: Diagnosis not present

## 2024-07-18 DIAGNOSIS — J45909 Unspecified asthma, uncomplicated: Secondary | ICD-10-CM | POA: Diagnosis not present

## 2024-07-18 DIAGNOSIS — I10 Essential (primary) hypertension: Secondary | ICD-10-CM | POA: Diagnosis not present

## 2024-07-23 DIAGNOSIS — S42032D Displaced fracture of lateral end of left clavicle, subsequent encounter for fracture with routine healing: Secondary | ICD-10-CM | POA: Diagnosis not present

## 2024-07-23 DIAGNOSIS — S42292D Other displaced fracture of upper end of left humerus, subsequent encounter for fracture with routine healing: Secondary | ICD-10-CM | POA: Diagnosis not present

## 2024-07-29 DIAGNOSIS — S42292D Other displaced fracture of upper end of left humerus, subsequent encounter for fracture with routine healing: Secondary | ICD-10-CM | POA: Diagnosis not present

## 2024-08-18 DIAGNOSIS — M25612 Stiffness of left shoulder, not elsewhere classified: Secondary | ICD-10-CM | POA: Diagnosis not present

## 2024-08-19 DIAGNOSIS — M7989 Other specified soft tissue disorders: Secondary | ICD-10-CM | POA: Diagnosis not present

## 2024-08-22 ENCOUNTER — Other Ambulatory Visit: Payer: Self-pay | Admitting: Surgery

## 2024-08-29 DIAGNOSIS — S42292D Other displaced fracture of upper end of left humerus, subsequent encounter for fracture with routine healing: Secondary | ICD-10-CM | POA: Diagnosis not present

## 2024-09-02 DIAGNOSIS — H1131 Conjunctival hemorrhage, right eye: Secondary | ICD-10-CM | POA: Diagnosis not present

## 2024-09-02 DIAGNOSIS — H189 Unspecified disorder of cornea: Secondary | ICD-10-CM | POA: Diagnosis not present

## 2024-09-04 DIAGNOSIS — H1131 Conjunctival hemorrhage, right eye: Secondary | ICD-10-CM | POA: Diagnosis not present

## 2024-09-04 DIAGNOSIS — H189 Unspecified disorder of cornea: Secondary | ICD-10-CM | POA: Diagnosis not present

## 2024-09-29 DIAGNOSIS — Z1231 Encounter for screening mammogram for malignant neoplasm of breast: Secondary | ICD-10-CM | POA: Diagnosis not present

## 2024-10-01 DIAGNOSIS — M810 Age-related osteoporosis without current pathological fracture: Secondary | ICD-10-CM | POA: Diagnosis not present

## 2024-10-07 DIAGNOSIS — H6123 Impacted cerumen, bilateral: Secondary | ICD-10-CM | POA: Diagnosis not present

## 2024-10-07 DIAGNOSIS — B9689 Other specified bacterial agents as the cause of diseases classified elsewhere: Secondary | ICD-10-CM | POA: Diagnosis not present

## 2024-10-07 DIAGNOSIS — J208 Acute bronchitis due to other specified organisms: Secondary | ICD-10-CM | POA: Diagnosis not present

## 2024-10-23 DIAGNOSIS — Z23 Encounter for immunization: Secondary | ICD-10-CM | POA: Diagnosis not present
